# Patient Record
Sex: Female | Born: 1990
Health system: Southern US, Community
[De-identification: ages and names within clinical notes are randomized; demographics above are authoritative.]

## PROBLEM LIST (undated history)

## (undated) DIAGNOSIS — J309 Allergic rhinitis, unspecified: Secondary | ICD-10-CM

## (undated) DIAGNOSIS — F419 Anxiety disorder, unspecified: Secondary | ICD-10-CM

## (undated) DIAGNOSIS — E282 Polycystic ovarian syndrome: Secondary | ICD-10-CM

## (undated) DIAGNOSIS — G4730 Sleep apnea, unspecified: Secondary | ICD-10-CM

## (undated) DIAGNOSIS — F53 Postpartum depression: Secondary | ICD-10-CM

## (undated) DIAGNOSIS — J40 Bronchitis, not specified as acute or chronic: Secondary | ICD-10-CM

## (undated) DIAGNOSIS — J45909 Unspecified asthma, uncomplicated: Secondary | ICD-10-CM

## (undated) DIAGNOSIS — J189 Pneumonia, unspecified organism: Secondary | ICD-10-CM

## (undated) DIAGNOSIS — O480 Post-term pregnancy: Secondary | ICD-10-CM

## (undated) DIAGNOSIS — L049 Acute lymphadenitis, unspecified: Secondary | ICD-10-CM

## (undated) DIAGNOSIS — Z3A41 41 weeks gestation of pregnancy: Secondary | ICD-10-CM

## (undated) DIAGNOSIS — O99345 Other mental disorders complicating the puerperium: Secondary | ICD-10-CM

## (undated) HISTORY — DX: Allergic rhinitis, unspecified: J30.9

## (undated) HISTORY — DX: Polycystic ovarian syndrome: E28.2

## (undated) HISTORY — DX: Unspecified asthma, uncomplicated: J45.909

## (undated) HISTORY — DX: Acute lymphadenitis, unspecified: L04.9

## (undated) HISTORY — PX: WISDOM TOOTH EXTRACTION: SHX21

## (undated) HISTORY — PX: OTHER SURGICAL HISTORY: SHX169

---
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
## 2009-01-25 ENCOUNTER — Ambulatory Visit: Payer: Self-pay | Admitting: Internal Medicine

## 2010-05-06 ENCOUNTER — Ambulatory Visit: Payer: Self-pay | Admitting: Family Medicine

## 2010-07-19 NOTE — Assessment & Plan Note (Signed)
Summary: med refill for asthma med--Dr Drue Novel pt--returning from school t...   Vital Signs:  Patient profile:   20 year old female Weight:      141 pounds BMI:     23.55 BP sitting:   118 / 76  (right arm)  Vitals Entered By: Doristine Devoid CMA (May 06, 2010 11:02 AM) CC: refill on asthma meds    History of Present Illness: 20 yo woman here today for asthma meds.  rarely using albuterol inhaler unless around animals.  feels asthma has gotten much better since moving to college (less cats/dogs, no longer [place] the [place]).  using Advair daily.  denies wheezing, SOB.  Current Medications (verified): 1)  Advair Diskus 100-50 Mcg/dose Misc (Fluticasone-Salmeterol) .Marland Kitchen.. 1 Puff Two Times A Day 2)  Ventolin Hfa 108 (90 Base) Mcg/act Aers (Albuterol Sulfate) .... 2 Puffs Qid As Needed Wheezing; 2 Puffs 30 To 40 Min Before Exercise 3)  Flonase 50 Mcg/act Susp (Fluticasone Propionate) .... 2 Puffs Once Daily On Each Side of The Nose 4)  Ortho Tri-Cyclen Lo 0.18/0.215/0.25 Mg-25 Mcg Tabs (Norgestim-Eth Estrad Triphasic) .... Take One Tablet Daily  Allergies (verified): No Known Drug Allergies  Social History: in [employer] household : F M 1 sister, 3 bro cats x 2, dog x 2   Review of Systems      See HPI  Physical Exam  General:  alert and well-developed.   Lungs:  normal respiratory effort, no intercostal retractions, no accessory muscle use, and normal breath sounds.   Heart:  normal rate, regular rhythm, and no murmur.     Impression & Recommendations:  Problem # 1:  ASTHMA (ICD-493.90) Assessment Unchanged rarely having to use recue inhaler now that she doesn't have regular contact w/ animals.  discussed possibility of switching to single agent controller med and stopping Advair.  pt will consider this for the future but doesn't want to do it now w/ winter break upcoming when she will be home w/ the animals. Her updated medication list for this problem includes:    Advair  Diskus 100-50 Mcg/dose Misc (Fluticasone-salmeterol) .Marland Kitchen... 1 puff two times a day    Ventolin Hfa 108 (90 Base) Mcg/act Aers (Albuterol sulfate) .Marland Kitchen... 2 puffs qid as needed wheezing; 2 puffs 30 to 40 min before exercise  Complete Medication List: 1)  Advair Diskus 100-50 Mcg/dose Misc (Fluticasone-salmeterol) .Marland Kitchen.. 1 puff two times a day 2)  Ventolin Hfa 108 (90 Base) Mcg/act Aers (Albuterol sulfate) .... 2 puffs qid as needed wheezing; 2 puffs 30 to 40 min before exercise 3)  Flonase 50 Mcg/act Susp (Fluticasone propionate) .... 2 puffs once daily on each side of the nose 4)  Ortho Tri-cyclen Lo 0.18/0.215/0.25 Mg-25 Mcg Tabs (Norgestim-eth estrad triphasic) .... Take one tablet daily  Patient Instructions: 1)  Schedule your complete physical at your convenience 2)  Continue your Advair as directed- use the Ventolin as needed for cough or wheezing 3)  Consider as your asthma improves switching to single agent controller med 4)  Have a great holiday season!!! Prescriptions: ADVAIR DISKUS 100-50 MCG/DOSE MISC (FLUTICASONE-SALMETEROL) 1 puff two times a day  #1 x 3   Entered and Authorized by:   Neena Rhymes MD   Signed by:   Neena Rhymes MD on 05/06/2010   Method used:   Electronically to        [facility] [phone][facility] (retail)       [address]  [place], [place]  [address]       Ph: [phone]       Fax: [phone]   RxID:   [id number] VENTOLIN HFA 108 (90 BASE) MCG/ACT AERS (ALBUTEROL SULFATE) 2 puffs qid as needed wheezing; 2 puffs 30 to 40 min before exercise  #1 x 3   Entered and Authorized by:   Neena Rhymes MD   Signed by:   Neena Rhymes MD on 05/06/2010   Method used:   Electronically to        [facility] [phone]* (retail)       [address]       [place], [place]  [address]       Ph: [phone]       Fax: [phone]   RxID:   [id number]    Orders Added: 1)  Est. Patient  Level III [16010]

## 2011-07-06 ENCOUNTER — Other Ambulatory Visit: Payer: Self-pay | Admitting: Internal Medicine

## 2011-07-06 NOTE — Telephone Encounter (Signed)
Last seen in 2010: call 1 advair , no further  RF w/o OV . If sx severe/persistent --- needs OV or UC eval ASAP

## 2011-07-06 NOTE — Telephone Encounter (Signed)
Pt has not been seen within the last year.  Confirmed Advair on medication list.  Pls advise.

## 2011-07-07 MED ORDER — FLUTICASONE-SALMETEROL 100-50 MCG/DOSE IN AEPB: 1.0000 | INHALATION_SPRAY | Freq: Two times a day (BID) | RESPIRATORY_TRACT | Status: DC

## 2011-07-07 NOTE — Telephone Encounter (Signed)
Attempted to contact pt multiple times.  Left message for pt to return call.

## 2011-07-07 NOTE — Telephone Encounter (Signed)
Rx sent to pharmacy electronically.  Left a message for pt to return call.

## 2011-10-03 ENCOUNTER — Other Ambulatory Visit: Payer: Self-pay | Admitting: Internal Medicine

## 2011-10-03 NOTE — Telephone Encounter (Signed)
Pt has not been seen within a year. Ok to refill? 

## 2011-10-06 NOTE — Telephone Encounter (Signed)
Okay to call one month supply, no refills. She has not been seen in more than a year, no further RF  without office visit

## 2011-10-06 NOTE — Telephone Encounter (Signed)
Refill done.  

## 2012-02-06 ENCOUNTER — Other Ambulatory Visit: Payer: Self-pay | Admitting: Internal Medicine

## 2012-02-06 NOTE — Telephone Encounter (Signed)
Refill done.  

## 2012-02-06 NOTE — Telephone Encounter (Signed)
Per Dr. Drue Novel last phone not pt needs an OV for future refills.

## 2012-04-01 ENCOUNTER — Ambulatory Visit (INDEPENDENT_AMBULATORY_CARE_PROVIDER_SITE_OTHER): Payer: BC Managed Care – PPO | Admitting: Internal Medicine

## 2012-04-01 VITALS — BP 112/76 | HR 67 | Temp 97.8°F | Ht 66.0 in | Wt 144.0 lb

## 2012-04-01 DIAGNOSIS — J45909 Unspecified asthma, uncomplicated: Secondary | ICD-10-CM

## 2012-04-01 DIAGNOSIS — J309 Allergic rhinitis, unspecified: Secondary | ICD-10-CM

## 2012-04-01 DIAGNOSIS — Z0000 Encounter for general adult medical examination without abnormal findings: Secondary | ICD-10-CM | POA: Insufficient documentation

## 2012-04-01 DIAGNOSIS — Z23 Encounter for immunization: Secondary | ICD-10-CM

## 2012-04-01 MED ORDER — ALBUTEROL SULFATE HFA 108 (90 BASE) MCG/ACT IN AERS: 2.0000 | INHALATION_SPRAY | Freq: Four times a day (QID) | RESPIRATORY_TRACT | Status: DC | PRN

## 2012-04-01 NOTE — Progress Notes (Signed)
  Subjective:    Patient ID: Brandi Hall, female    DOB: July 03, 1990, 21 y.o.   MRN: [id number]  HPI CPX  Past Medical History: hosp for infected lymph node (Salmonella) at 21 y/o asthma Allergic rhinitis  Past Surgical History: LAD Bx  Family History: breast cancer-- mother colon ca-- GGfather uterine ca-- GM, aunt  DM-- GF CAD-- GF (CHF, pacemaker)  Social History: Therapist, art, has 2 jobs Living w/ parents  cats x 2, dog x1  Tobacco-- no ETOH-- socially   Review of Systems Doing well. Denies any chest pain, shortness of breath. No dysuria gross hematuria. No vaginal discharge Occasionally has anxiety but that is rare, no depression. x last 2 years has occasional right upper abdominal pain usually related to constipation. No associated fever chills or vomiting. Occasional nausea    Objective:   Physical Exam  General -- alert, well-developed, and well-nourished.   Neck --no thyromegaly Lungs -- normal respiratory effort, no intercostal retractions, no accessory muscle use, and normal breath sounds.   Heart-- normal rate, regular rhythm, no murmur, and no gallop.   Abdomen--soft, non-tender, no distention, no masses, no HSM, no guarding, and no rigidity.   Extremities-- no pretibial edema bilaterally Neurologic-- alert & oriented X3 and strength normal in all extremities. Psych-- Cognition and judgment appear intact. Alert and cooperative with normal attention span and concentration.  not anxious appearing and not depressed appearing.        Assessment & Plan:

## 2012-04-01 NOTE — Patient Instructions (Addendum)
Uses ventolin if you are going out for a run. Also if wheezing. Minimize your exposed to pets Please check your records, be sure you have 3 shots of hepatitis B Please come back in 6 months

## 2012-04-01 NOTE — Assessment & Plan Note (Signed)
On claritin

## 2012-04-01 NOTE — Assessment & Plan Note (Addendum)
Td 2003, Tdap today Had Gardasil completed Flu shot (history of asthma) Per chart review, not sure if she completed hep B shots, recommend pt to review her records Diet and exercise discussed Female care by Dr Lilli Light Also, occupational abdominal pain if constipated, recommend to drink plenty of fluids and fruits-vegetables. Metamucin is also an option to prevent constipation

## 2012-04-01 NOTE — Assessment & Plan Note (Signed)
Currently on advair twice a day, ran out of Ventolin. Wheezing  only if running more than usual or if she is exposed to animals. Recommend to continue with  advair Rx Ventolin  for prn and pre-exercise use Also prevent asthma w/ antigen avoidance, minimize exposure, tips provided  Reassess in 6 months, consider switch to Qvar.

## 2012-04-02 ENCOUNTER — Encounter: Payer: Self-pay | Admitting: Internal Medicine

## 2012-04-02 LAB — CBC WITH DIFFERENTIAL/PLATELET
Basophils Relative: 0.4 % (ref 0.0–3.0)
Eosinophils Relative: 13 % — ABNORMAL HIGH (ref 0.0–5.0)
HCT: 38.8 % (ref 36.0–46.0)
Hemoglobin: 12.8 g/dL (ref 12.0–15.0)
Lymphs Abs: 2.3 10*3/uL (ref 0.7–4.0)
MCHC: 32.8 g/dL (ref 30.0–36.0)
MCV: 91.7 fl (ref 78.0–100.0)
Monocytes Absolute: 0.3 10*3/uL (ref 0.1–1.0)
Neutro Abs: 3.3 10*3/uL (ref 1.4–7.7)
Neutrophils Relative %: 48.5 % (ref 43.0–77.0)
RBC: 4.24 Mil/uL (ref 3.87–5.11)
WBC: 6.8 10*3/uL (ref 4.5–10.5)

## 2012-04-02 LAB — COMPREHENSIVE METABOLIC PANEL
AST: 21 U/L (ref 0–37)
Alkaline Phosphatase: 42 U/L (ref 39–117)
BUN: 10 mg/dL (ref 6–23)
Creatinine, Ser: 0.7 mg/dL (ref 0.4–1.2)
Glucose, Bld: 75 mg/dL (ref 70–99)
Total Bilirubin: 0.5 mg/dL (ref 0.3–1.2)

## 2012-04-03 ENCOUNTER — Encounter: Payer: Self-pay | Admitting: *Deleted

## 2012-04-05 ENCOUNTER — Other Ambulatory Visit: Payer: Self-pay | Admitting: Internal Medicine

## 2012-04-05 NOTE — Telephone Encounter (Signed)
Refill done.  

## 2012-09-30 ENCOUNTER — Ambulatory Visit: Payer: Self-pay | Admitting: Internal Medicine

## 2012-10-10 ENCOUNTER — Encounter: Payer: Self-pay | Admitting: Internal Medicine

## 2012-10-10 ENCOUNTER — Ambulatory Visit (INDEPENDENT_AMBULATORY_CARE_PROVIDER_SITE_OTHER): Payer: BC Managed Care – PPO | Admitting: Internal Medicine

## 2012-10-10 VITALS — BP 122/80 | HR 81 | Temp 98.2°F | Wt 145.0 lb

## 2012-10-10 DIAGNOSIS — J45909 Unspecified asthma, uncomplicated: Secondary | ICD-10-CM

## 2012-10-10 MED ORDER — BECLOMETHASONE DIPROPIONATE 80 MCG/ACT IN AERS: 2.0000 | INHALATION_SPRAY | Freq: Two times a day (BID) | RESPIRATORY_TRACT | Status: DC

## 2012-10-10 NOTE — Patient Instructions (Addendum)
In 2 or 3 weeks, stop  advair and use instead qvar 2 puffs twice a day. As long as your asthma is well controlled stay on qvar, if you notice you are needing ventolin more often then go back to advair and let me know Next visit 6 months for a yearly check up

## 2012-10-10 NOTE — Assessment & Plan Note (Signed)
Well-controlled with Advair. Will try Qvar and continue with albuterol when necessary. See instructions.

## 2012-10-10 NOTE — Progress Notes (Signed)
  Subjective:    Patient ID: Brandi Hall, female    DOB: 12-21-1990, 22 y.o.   MRN: [id number]  HPI Routine office visit Asthma, good compliance with medications, uses albuterol preexercise but otherwise very rarely, less than one time a week. Has made some changes at home and is less exposed to cats. Allergies well controlled.  Also, he is concerned about some knots:  1 at the left leg, One in the right breast and one in the right chest. They were checked by her gynecologist, felt to be fatty tissue, she had an ultrasound and apparently they didn't find anything wrong. The one in the chest is tender sometimes.  Past Medical History: hosp for infected lymph node (Salmonella) at 22 y/o asthma Allergic rhinitis  Past Surgical History: LAD Bx  Family History: breast cancer-- mother colon ca-- GGfather uterine ca-- GM, aunt   DM-- GF CAD-- GF (CHF, pacemaker)  Social History: Therapist, art, has 2 jobs Living w/ parents   cats x 2, dog x1   Tobacco-- no ETOH-- socially   Review of Systems     Objective:   Physical Exam  Constitutional: She appears well-developed and well-nourished. No distress.  Cardiovascular: Normal rate, regular rhythm and normal heart sounds.  Exam reveals no gallop.   No murmur heard. Pulmonary/Chest: Effort normal and breath sounds normal. No respiratory distress. She has no wheezes. She has no rales.  Genitourinary:  Breast quite nodular but no dominant mass. No abnormality in the chest, the 2 areas of pt's concern are in the R breast  Skin: She is not diaphoretic.             Assessment & Plan:  Fibrocystic breast dz? Get U/S report SBE Call if SBE changes or pain increases, if that is the case will need a MMG  SQ nodule in the leg Observation, call if change in size

## 2013-04-09 ENCOUNTER — Telehealth: Payer: Self-pay

## 2013-04-09 NOTE — Telephone Encounter (Addendum)
Left message for call back  Unable to reach prior to visit

## 2013-04-11 ENCOUNTER — Encounter: Payer: BC Managed Care – PPO | Admitting: Internal Medicine

## 2013-04-16 ENCOUNTER — Telehealth: Payer: Self-pay

## 2013-04-16 NOTE — Telephone Encounter (Addendum)
Left message for call back Non identifiable Mother returned call---not on DPR Will have patient call  PAP--? Flu vaccine? All 3 Hep ? Unable to reach prior to visit

## 2013-04-17 ENCOUNTER — Encounter: Payer: Self-pay | Admitting: Internal Medicine

## 2013-04-17 ENCOUNTER — Ambulatory Visit (INDEPENDENT_AMBULATORY_CARE_PROVIDER_SITE_OTHER): Payer: BC Managed Care – PPO | Admitting: Internal Medicine

## 2013-04-17 VITALS — BP 112/68 | HR 78 | Temp 98.4°F | Ht 66.25 in | Wt 142.2 lb

## 2013-04-17 DIAGNOSIS — Z0000 Encounter for general adult medical examination without abnormal findings: Secondary | ICD-10-CM

## 2013-04-17 DIAGNOSIS — J45909 Unspecified asthma, uncomplicated: Secondary | ICD-10-CM

## 2013-04-17 DIAGNOSIS — Z1331 Encounter for screening for depression: Secondary | ICD-10-CM

## 2013-04-17 LAB — LIPID PANEL
HDL: 74 mg/dL (ref >39.00)
LDL Cholesterol: 84 mg/dL (ref 0–99)
Total CHOL/HDL Ratio: 2
VLDL: 14.6 mg/dL (ref 0.0–40.0)

## 2013-04-17 LAB — COMPREHENSIVE METABOLIC PANEL
AST: 18 U/L (ref 0–37)
BUN: 8 mg/dL (ref 6–23)
CO2: 26 mEq/L (ref 19–32)
Calcium: 9.2 mg/dL (ref 8.4–10.5)
Chloride: 105 mEq/L (ref 96–112)
Creatinine, Ser: 0.7 mg/dL (ref 0.4–1.2)
GFR: 113.89 mL/min (ref >60.00)
Total Bilirubin: 0.7 mg/dL (ref 0.3–1.2)

## 2013-04-17 LAB — CBC WITH DIFFERENTIAL/PLATELET
Basophils Relative: 0.6 % (ref 0.0–3.0)
HCT: 39.7 % (ref 36.0–46.0)
Hemoglobin: 13.5 g/dL (ref 12.0–15.0)
Lymphocytes Relative: 28.3 % (ref 12.0–46.0)
Monocytes Relative: 4.4 % (ref 3.0–12.0)
Neutro Abs: 3.8 10*3/uL (ref 1.4–7.7)
RBC: 4.49 Mil/uL (ref 3.87–5.11)

## 2013-04-17 MED ORDER — AMOXICILLIN 500 MG PO CAPS: 1000.0000 mg | ORAL_CAPSULE | Freq: Two times a day (BID) | ORAL | Status: DC

## 2013-04-17 NOTE — Progress Notes (Signed)
  Subjective:    Patient ID: Brandi Hall, female    DOB: 1990-08-15, 22 y.o.   MRN: [id number]  HPI CPX Developed a cold 2 weeks ago: Sore throat, nasal congestion, postnasal dripping, cough green sputum. Asthma is usually under excellent control with Advair, rarely uses albuterol but in the last 2 weeks she has needed albuterol daily. In the last 2 days wheezing has  decreased.  Past Medical History  Diagnosis Date  . Lymph node abscess     hosp for infected lymph node (Salmonella) at 22 y/o  . Asthma   . Allergic rhinitis    Past Surgical History  Procedure Laterality Date  . Lad bx     History   Social History  . Marital Status: Single    Spouse Name: N/A    Number of Children: 0  . Years of Education: N/A   Occupational History  . finished college, 2 jobs    Social History Main Topics  . Smoking status: Never Smoker   . Smokeless tobacco: Never Used  . Alcohol Use: Yes     Comment: rarely  . Drug Use: No  . Sexual Activity: Not on file   Other Topics Concern  . Not on file   Social History Narrative   Lives by herself   cats x  1     Family History  Problem Relation Age of Onset  . Breast cancer Mother     Breast  . Uterine cancer Maternal Aunt   . Colon cancer Other     Colon  . Diabetes Other     GGF  . Coronary artery disease    . Congestive Heart Failure        Review of Systems Diet-- healthier  No  CP,  lower extremity edema Denies  nausea, vomiting; + diarrhea and abd cramps 1 hour after eat bread or pasta x last month Denies  blood in the stools No GERD  Sx.  No dysuria, gross hematuria, difficulty urinating   + stress but no anxiety, depression        Objective:   Physical Exam BP 112/68  Pulse 78  Temp(Src) 98.4 F (36.9 C) (Oral)  Ht 5' 6.25" (1.683 m)  Wt 142 lb 3.2 oz (64.501 kg)  BMI 22.77 kg/m2  SpO2 99%  LMP 03/30/2013 General -- alert, well-developed, NAD.   HEENT-- Not pale. TMs normal, throat symmetric, no  redness or discharge. Face symmetric, sinuses not tender to palpation. Nose quite congested.  Lungs -- normal respiratory effort, no intercostal retractions, no accessory muscle use, and normal breath sounds (except for few ronchi, no wheezing)  Heart-- normal rate, regular rhythm, no murmur.  Abdomen-- Not distended, good bowel sounds,soft, non-tender.  Extremities-- no pretibial edema bilaterally  Neurologic--  alert & oriented X3. Speech normal, gait normal, strength normal in all extremities.  Psych-- Cognition and judgment appear intact. Cooperative with normal attention span and concentration. No anxious appearing , no depressed appearing.      Assessment & Plan:

## 2013-04-17 NOTE — Patient Instructions (Signed)
Get your blood work before you leave  Next visit in 3 months for a follow up . No Fasting Please make an appointment    Rest, fluids , tylenol For cough, take Mucinex DM twice a day as needed  Continue your asthma meds Take the antibiotic as prescribed  (Amoxicillin) Call if no better in few days Call anytime if the symptoms are severe Once  better I recommend a pneumonia and flu shot.     Gluten-Free Diet Gluten is a protein found in many grains. Gluten is present in wheat, rye, and barley. Gluten from wheat, rye, and barley protein interferes with the absorption of food in people with gluten sensitivity. It may also cause intestinal injury when eaten by individuals with gluten sensitivity.  A sample piece (biopsy) of the small intestine is usually required for a positive diagnosis of gluten sensitivity. Dietary treatment consists of eliminating foods and food ingredients from wheat, rye, and barley. When these are taken out of the diet completely, most people regain function of the small intestine. Strict compliance is important even during symptom-free periods. People with gluten sensitivity need to be on a gluten-free diet for a lifetime. During the first stages of treatment, some people will also need to restrict dairy products that contain lactose, which is a naturally occurring sugar. Lactose is difficult to absorb when the small intestines are damaged (lactose intolerance).  WHO NEEDS THIS DIET Some people who have certain diseases need to be on a gluten-free diet. These diseases include:  Celiac disease.  Nontropical sprue.  Gluten-sensitive enteropathy.  Dermatitis herpetiformis. SPECIAL NOTES  Read all labels because gluten may have been added as an incidental ingredient. Words to check for on the label include: flour, starch, durum flour, graham flour, phosphated flour, self-rising flour, semolina, farina, modified food starch, cereal, thickening, fillers, emulsifiers, any  kind of malt flavoring, and hydrolyzed vegetable protein. A registered dietician can help you identify possible harmful ingredients in the foods you normally eat.  If you are not sure whether an ingredient contains gluten, check with the manufacturer. Note that some manufacturers may change ingredients without notice. Always read labels.  Since flour and cereal products are often used in the preparation of foods, it is important to be aware of the methods of preparation used, as well as the foods themselves. This is especially true when you are dining out. Starches  Allowed: Only those prepared from arrowroot, corn, potato, rice, and bean flours. Rice wafers(*), pure cornmeal tortillas, popcorn, some crackers, and chips(*). Hot cereals made from cornmeal. Ask your dietician which specific hot and cold cereals are allowed. White or sweet potatoes, yams, hominy, rice or wild rice, and special gluten-free pasta. Some oriental rice noodles or bean noodles.  Avoid: All wheat and rye cereals, wheat germ, barley, bran, graham, malt, bulgur, and millet(-). NOTE: Avoid cereals containing malt as a flavoring, such as rice cereal. Regular noodles, spaghetti, macaroni, and most packaged rice mixes(*). All others containing wheat, rye, or barley. Vegetables  Allowed: All plain, fresh, frozen, or canned vegetables.  Avoid: Creamed vegetables(*) and vegetables canned in sauces(*). Any prepared with wheat, rye, or barley. Fruit  Allowed: All fresh, frozen, canned, or dried fruits. Fruit juices.  Avoid: Thickened or prepared fruits and some pie fillings(*). Meat and Meat Substitutes  Allowed: Meat, fish, poultry, or eggs prepared without added wheat, rye, or barley. Luncheon meat(*), frankfurters(*), and pure meat. All aged cheese and processed cheese products(*). Cottage cheese(+) and cream cheese(+). Dried beans,  dried peas, and lentils.  Avoid: Any meat or meat alternate containing wheat, rye, barley, or  gluten stabilizers. Bread-containing products, such as Swiss steak, croquettes, and meatloaf. Tuna canned in vegetable broth(*); [place] with HVP injected as part of the basting; any cheese product containing oat gum as an ingredient. Milk  Allowed: Milk. Yogurt made with allowed ingredients(*).  Avoid: Commercial chocolate milk which may have cereal added(*). Malted milk. Soups and Combination Foods  Allowed: Homemade broth and soups made with allowed ingredients; some canned or frozen soups are allowed(*). Combination or prepared foods that do not contain gluten(*). Read labels.  Avoid: All soups containing wheat, rye, or barley flour. Bouillon and bouillon cubes that contain hydrolyzed vegetable protein (HVP). Combination or prepared foods that contain gluten(*). Desserts  Allowed:  Custard, some pudding mixes(*), homemade puddings from cornstarch, rice, and tapioca. Gelatin desserts, ices, and sherbet(*). Cake, cookies, and other desserts prepared with allowed flours. Some commercial ice creams(*). Ask your dietician about specific brands of dessert that are allowed.  Avoid: Cakes, cookies, doughnuts, and pastries that are prepared with wheat, rye, or barley flour. Some commercial ice creams(*), ice cream flavors which contain cookies, crumbs, or cheesecake(*). Ice cream cones. All commercially prepared mixes for cakes, cookies, and other desserts(*). Bread pudding and other puddings thickened with flour. Sweets  Allowed: Sugar, honey, syrup(*), molasses, jelly, jam, plain hard candy, marshmallows, gumdrops, homemade candies free from wheat, rye, or barley. Coconut.  Avoid: Commercial candies containing wheat, rye, or barley(*). Certain buttercrunch toffees are dusted with wheat flour. Ask your dietician about specific brands that are not allowed. Chocolate-coated nuts, which are often rolled in flour. Fats and Oils  Allowed: Butter, margarine, vegetable oil, sour cream(+), whipping cream,  shortening, lard, cream, mayonnaise(*). Some commercial salad dressings(*). Peanut butter.  Avoid: Some commercial salad dressings(*). Beverages  Allowed: Coffee (regular or decaffeinated), tea, herbal tea (read label to be sure that no wheat flour has been added). Carbonated beverages and some root beers(*). Wine, sake, and distilled spirits, such as gin, vodka, and whiskey.  Avoid:  Certain cereal beverages. Ask your dietician about specific brands that are not allowed. Beer (unless gluten-free), ale, malted milk, and some root beers, wine, and sake. Condiments/ Miscellaneous  Allowed: Salt, pepper, herbs, spices, extracts, and food colorings. Monosodium glutamate (MSG). Cider, rice, and wine vinegar. Baking soda and baking powder. Certain soy sauces. Ask your dietician about specific brands that are allowed. Nuts, coconut, chocolate, and pure cocoa powder.  Avoid: Some curry powder(*), some dry seasoning mixes(*), some gravy extracts(*), some meat sauces(*), some catsup(*), some prepared mustard(*), horseradish(*), some soy sauce(*), chip dips(*), and some chewing gum(*). Yeast extract (contains barley). Caramel color (may contain malt). Ask your dietician about specific brands of condiments to avoid. Flour and Thickening Agents  Allowed: Arrowroot starch (A); Corn bran (B); Corn flour (B,C,D); Corn germ (B); Cornmeal (B,C,D); Corn starch (A); Potato flour (B,C,E); Potato starch flour (B,C,E); Rice bran (B); Rice flours: Plain, brown (B,C,D,E), and Sweet (A,B,C,F). Rice polish (B,C,G); Soy flour (B,C,G); Tapioca starch (A). The flour and thickening agents described above are good for: (A) Good thickening agent (B) Good when combined with other flours (C) Best combined with milk and eggs in baked products (D) Best in grainy-textured products (E) Produces drier product than other flours (F) Produces moister product than other flours (G) Adds distinct flavor to product. Use in moderation. (*)  Check labels and investigate any questionable ingredients.  (-) Additional research is needed before this  product can be recommended. (+) Check vegetable gum used. SAMPLE MEAL PLAN Breakfast   Orange juice.  Banana.  Rice or corn cereal.  Toast (gluten-free bread).  Heart-healthy tub margarine.  Jam.  Milk.  Coffee or tea. Lunch  Chicken salad sandwich (with gluten-free bread and mayonnaise).  Sliced tomatoes.  Heart-healthy tub margarine.  Apple.  Milk.  Coffee or tea. Dinner  [place].  Baked potato.  Broccoli.  Lettuce salad with gluten-free dressing.  Gluten-free bread.  Custard.  Heart-healthy margarine.  Coffee or tea. These meal plans are provided as samples. Your daily meal plans will vary. Document Released: 06/05/2005 Document Revised: 12/05/2011 Document Reviewed: 07/16/2011 [facility] Patient Information 2014 [place], [place].

## 2013-04-17 NOTE — Assessment & Plan Note (Addendum)
Td 20013 Reluctant to get a flu shot , got sick last year PNM shot once she is better from the URI ?completed hep B shots, ? menactra---> recommend pt to bring her records Diet and exercise discussed Female care needs a new gyn in [place], will refer  Labs Also has developed diarrhea for the last month, usually following a bread  or pasta intake, no recent antibiotics: Recommend the gluten-free diet as a trial, if anemia or other symptoms persist she will need further eval. Followup in 3 months

## 2013-04-17 NOTE — Assessment & Plan Note (Signed)
Qvar didn't work, went back to advair  twice a day, symptoms under excellent control , hardly ever uses ventolin except for the last 2 weeks----> currently has a URI/bronchitis. Plan: Amoxicillin, see instructions

## 2013-04-18 ENCOUNTER — Encounter: Payer: Self-pay | Admitting: Internal Medicine

## 2013-04-21 ENCOUNTER — Encounter: Payer: Self-pay | Admitting: *Deleted

## 2013-06-19 ENCOUNTER — Other Ambulatory Visit: Payer: Self-pay | Admitting: Internal Medicine

## 2013-06-20 NOTE — Telephone Encounter (Signed)
Advair refilled per protocol. JG//CMA 

## 2013-07-22 ENCOUNTER — Encounter: Payer: Self-pay | Admitting: Internal Medicine

## 2013-07-22 ENCOUNTER — Ambulatory Visit (INDEPENDENT_AMBULATORY_CARE_PROVIDER_SITE_OTHER): Payer: BC Managed Care – PPO | Admitting: Internal Medicine

## 2013-07-22 VITALS — BP 107/68 | HR 70 | Temp 97.9°F | Wt 142.0 lb

## 2013-07-22 DIAGNOSIS — Z0000 Encounter for general adult medical examination without abnormal findings: Secondary | ICD-10-CM

## 2013-07-22 DIAGNOSIS — Z23 Encounter for immunization: Secondary | ICD-10-CM

## 2013-07-22 DIAGNOSIS — J45909 Unspecified asthma, uncomplicated: Secondary | ICD-10-CM

## 2013-07-22 DIAGNOSIS — J309 Allergic rhinitis, unspecified: Secondary | ICD-10-CM

## 2013-07-22 MED ORDER — BECLOMETHASONE DIPROPIONATE 80 MCG/ACT IN AERS: 1.0000 | INHALATION_SPRAY | Freq: Two times a day (BID) | RESPIRATORY_TRACT | Status: DC

## 2013-07-22 MED ORDER — AZELASTINE HCL 0.1 % NA SOLN: 2.0000 | Freq: Two times a day (BID) | NASAL | Status: DC

## 2013-07-22 NOTE — Assessment & Plan Note (Addendum)
On Flonase, still has symptoms, add astepro. Also discussed avoidance of allergens, use of saline nasal irrigation

## 2013-07-22 NOTE — Patient Instructions (Addendum)
In addition to Flonase, take Astelin twice a day, see if that help with your allergies  In a couple of months stop Advair and try Qvar twice a day, stay  on Qvar as far as your asthma continue to be well controlled otherwise go back to Advair.   Next visit 6 months

## 2013-07-22 NOTE — Progress Notes (Signed)
Pre visit review using our clinic review tool, if applicable. No additional management support is needed unless otherwise documented below in the visit note. 

## 2013-07-22 NOTE — Assessment & Plan Note (Signed)
Symptoms well-controlled on Advair and Ventolin. Declined a flu shot Pneumonia shot provided today Plan: In a couple of months switch from advair to Qvar, go back to Advair if symptoms are not well controlled.

## 2013-07-22 NOTE — Progress Notes (Signed)
   Subjective:    Patient ID: Brandi Hall, female    DOB: 04/12/1991, 23 y.o.   MRN: [id number]  HPI Routine office visit Asthma, good compliance of medication, very rarely uses Ventolin. Allergies--on Flonase, continue with persistent sinus congestion.  Past Medical History  Diagnosis Date  . Lymph node abscess     hosp for infected lymph node (Salmonella) at 23 y/o  . Asthma   . Allergic rhinitis    Past Surgical History  Procedure Laterality Date  . Lad bx      Review of Systems Denies fever or chills + Nasal discharge and postnasal dripping,  Color varies  from clear to white. Has changed her diet, feels better with a gluten decreased diet. Able to exercise without cough or wheezing.     Objective:   Physical Exam BP 107/68  Pulse 70  Temp(Src) 97.9 F (36.6 C)  Wt 142 lb (64.411 kg)  SpO2 96% General -- alert, well-developed, NAD.   HEENT-- Not pale. TMs normal, throat symmetric, no redness or discharge. Face symmetric, sinuses not tender to palpation. Nose slt congested. Lungs -- normal respiratory effort, no intercostal retractions, no accessory muscle use, and normal breath sounds.  Heart-- normal rate, regular rhythm, no murmur.  Neurologic--  alert & oriented X3. Speech normal, gait normal, strength normal in all extremities.  Psych-- Cognition and judgment appear intact. Cooperative with normal attention span and concentration. No anxious or depressed appearing.     Assessment & Plan:

## 2013-07-22 NOTE — Assessment & Plan Note (Signed)
See previous entry, gluten reduced diet is helping in general, feels better. Pneumonia shot today hep B shots , [facility] records pending

## 2013-10-14 ENCOUNTER — Other Ambulatory Visit: Payer: Self-pay | Admitting: *Deleted

## 2013-10-14 MED ORDER — FLUTICASONE-SALMETEROL 100-50 MCG/DOSE IN AEPB: INHALATION_SPRAY | RESPIRATORY_TRACT | Status: DC

## 2013-11-24 ENCOUNTER — Ambulatory Visit: Payer: BC Managed Care – PPO | Admitting: Family

## 2013-11-24 ENCOUNTER — Ambulatory Visit (INDEPENDENT_AMBULATORY_CARE_PROVIDER_SITE_OTHER): Payer: BC Managed Care – PPO | Admitting: Nurse Practitioner

## 2013-11-24 ENCOUNTER — Encounter: Payer: Self-pay | Admitting: Nurse Practitioner

## 2013-11-24 VITALS — BP 100/70 | HR 75 | Temp 98.0°F | Ht 66.25 in | Wt 139.0 lb

## 2013-11-24 DIAGNOSIS — H6593 Unspecified nonsuppurative otitis media, bilateral: Secondary | ICD-10-CM

## 2013-11-24 DIAGNOSIS — J069 Acute upper respiratory infection, unspecified: Secondary | ICD-10-CM

## 2013-11-24 DIAGNOSIS — H6590 Unspecified nonsuppurative otitis media, unspecified ear: Secondary | ICD-10-CM

## 2013-11-24 NOTE — Patient Instructions (Signed)
You have a virus causing your symptoms. The average duration of cold symptoms is 14 days. Start daily sinus rinses (neilmed Sinus Rinse). Use 30 mg to 60 mg pseudoephedrine twice daily. Sip fluids every hour. Rest. Continue to use claritin, or you may switch to azelastine. Do not use both. Continue flonase (use after sinus rinse). If you are not feeling better in 1 week or develop fever or chest pain, call us for re-evaluation. Feel better!  Upper Respiratory Infection, Adult An upper respiratory infection (URI) is also sometimes known as the common cold. The upper respiratory tract includes the nose, sinuses, throat, trachea, and bronchi. Bronchi are the airways leading to the lungs. Most people improve within 1 week, but symptoms can last up to 2 weeks. A residual cough may last even longer.  CAUSES Many different viruses can infect the tissues lining the upper respiratory tract. The tissues become irritated and inflamed and often become very moist. Mucus production is also common. A cold is contagious. You can easily spread the virus to others by oral contact. This includes kissing, sharing a glass, coughing, or sneezing. Touching your mouth or nose and then touching a surface, which is then touched by another person, can also spread the virus. SYMPTOMS  Symptoms typically develop 1 to 3 days after you come in contact with a cold virus. Symptoms vary from person to person. They may include:  Runny nose.  Sneezing.  Nasal congestion.  Sinus irritation.  Sore throat.  Loss of voice (laryngitis).  Cough.  Fatigue.  Muscle aches.  Loss of appetite.  Headache.  Low-grade fever. DIAGNOSIS  You might diagnose your own cold based on familiar symptoms, since most people get a cold 2 to 3 times a year. Your caregiver can confirm this based on your exam. Most importantly, your caregiver can check that your symptoms are not due to another disease such as strep throat, sinusitis, pneumonia,  asthma, or epiglottitis. Blood tests, throat tests, and X-rays are not necessary to diagnose a common cold, but they may sometimes be helpful in excluding other more serious diseases. Your caregiver will decide if any further tests are required. RISKS AND COMPLICATIONS  You may be at risk for a more severe case of the common cold if you smoke cigarettes, have chronic heart disease (such as heart failure) or lung disease (such as asthma), or if you have a weakened immune system. The very young and very old are also at risk for more serious infections. Bacterial sinusitis, middle ear infections, and bacterial pneumonia can complicate the common cold. The common cold can worsen asthma and chronic obstructive pulmonary disease (COPD). Sometimes, these complications can require emergency medical care and may be life-threatening. PREVENTION  The best way to protect against getting a cold is to practice good hygiene. Avoid oral or hand contact with people with cold symptoms. Wash your hands often if contact occurs. There is no clear evidence that vitamin C, vitamin E, echinacea, or exercise reduces the chance of developing a cold. However, it is always recommended to get plenty of rest and practice good nutrition. TREATMENT  Treatment is directed at relieving symptoms. There is no cure. Antibiotics are not effective, because the infection is caused by a virus, not by bacteria. Treatment may include:  Increased fluid intake. Sports drinks offer valuable electrolytes, sugars, and fluids.  Breathing heated mist or steam (vaporizer or shower).  Eating chicken soup or other clear broths, and maintaining good nutrition.  Getting plenty of rest.  Using gargles or lozenges for comfort.  Controlling fevers with ibuprofen or acetaminophen as directed by your caregiver.  Increasing usage of your inhaler if you have asthma. Zinc gel and zinc lozenges, taken in the first 24 hours of the common cold, can shorten the  duration and lessen the severity of symptoms. Pain medicines may help with fever, muscle aches, and throat pain. A variety of non-prescription medicines are available to treat congestion and runny nose. Your caregiver can make recommendations and may suggest nasal or lung inhalers for other symptoms.  HOME CARE INSTRUCTIONS   Only take over-the-counter or prescription medicines for pain, discomfort, or fever as directed by your caregiver.  Use a warm mist humidifier or inhale steam from a shower to increase air moisture. This may keep secretions moist and make it easier to breathe.  Drink enough water and fluids to keep your urine clear or pale yellow.  Rest as needed.  Return to work when your temperature has returned to normal or as your caregiver advises. You may need to stay home longer to avoid infecting others. You can also use a face mask and careful hand washing to prevent spread of the virus. SEEK MEDICAL CARE IF:   After the first few days, you feel you are getting worse rather than better.  You need your caregiver's advice about medicines to control symptoms.  You develop chills, worsening shortness of breath, or brown or red sputum. These may be signs of pneumonia.  You develop yellow or brown nasal discharge or pain in the face, especially when you bend forward. These may be signs of sinusitis.  You develop a fever, swollen neck glands, pain with swallowing, or white areas in the back of your throat. These may be signs of strep throat. SEEK IMMEDIATE MEDICAL CARE IF:   You have a fever.  You develop severe or persistent headache, ear pain, sinus pain, or chest pain.  You develop wheezing, a prolonged cough, cough up blood, or have a change in your usual mucus (if you have chronic lung disease).  You develop sore muscles or a stiff neck. Document Released: 11/29/2000 Document Revised: 08/28/2011 Document Reviewed: 10/07/2010 [facility] Patient Information 2014 [place],  [place].

## 2013-11-24 NOTE — Progress Notes (Signed)
Pre visit review using our clinic review tool, if applicable. No additional management support is needed unless otherwise documented below in the visit note. 

## 2013-11-24 NOTE — Progress Notes (Signed)
   Subjective:    Patient ID: Brandi Hall, female    DOB: 08-27-1990, 23 y.o.   MRN: [id number]  Cough This is a new problem. The current episode started in the past 7 days. The problem has been gradually worsening. The cough is productive of sputum. Associated symptoms include a fever (resolved), nasal congestion, postnasal drip and a sore throat. Pertinent negatives include no chest pain, chills, ear congestion, ear pain, headaches, shortness of breath or wheezing. The symptoms are aggravated by lying down. She has tried a beta-agonist inhaler for the symptoms. The treatment provided no relief. Her past medical history is significant for asthma.      Review of Systems  Constitutional: Positive for fever (resolved). Negative for chills, activity change, appetite change and fatigue.  HENT: Positive for congestion, postnasal drip and sore throat. Negative for ear pain.   Respiratory: Positive for cough and chest tightness. Negative for shortness of breath and wheezing.   Cardiovascular: Negative for chest pain.  Musculoskeletal: Negative for back pain.  Neurological: Negative for headaches.       Objective:   Physical Exam  Vitals reviewed. Constitutional: She is oriented to person, place, and time. She appears well-developed and well-nourished. No distress.  HENT:  Head: Normocephalic and atraumatic.  Right Ear: External ear normal.  Left Ear: External ear normal.  Mouth/Throat: Oropharynx is clear and moist. No oropharyngeal exudate.  bilat TM effusion, bones visible. Moderate clear nasal discharge.  Eyes: Conjunctivae are normal. Right eye exhibits no discharge. Left eye exhibits no discharge.  Neck: Normal range of motion. Neck supple. No thyromegaly present.  Cardiovascular: Normal rate, regular rhythm and normal heart sounds.   No murmur heard. Pulmonary/Chest: Effort normal and breath sounds normal. No respiratory distress. She has no wheezes. She has no rales.    Lymphadenopathy:    She has no cervical adenopathy.  Neurological: She is oriented to person, place, and time.  Skin: Skin is warm and dry.  Psychiatric: She has a normal mood and affect. Thought content normal.          Assessment & Plan:   1. Bilateral otitis media with effusion Continue flonase  2. Viral upper respiratory illness Sinus rinse pseudoephedrine

## 2013-12-10 ENCOUNTER — Ambulatory Visit (INDEPENDENT_AMBULATORY_CARE_PROVIDER_SITE_OTHER): Payer: BC Managed Care – PPO | Admitting: Family Medicine

## 2013-12-10 VITALS — BP 112/66 | HR 89 | Temp 98.2°F | Ht 65.0 in | Wt 137.4 lb

## 2013-12-10 DIAGNOSIS — M76899 Other specified enthesopathies of unspecified lower limb, excluding foot: Secondary | ICD-10-CM

## 2013-12-10 DIAGNOSIS — M7051 Other bursitis of knee, right knee: Secondary | ICD-10-CM

## 2013-12-10 MED ORDER — NAPROXEN 500 MG PO TABS: ORAL_TABLET | ORAL | Status: DC

## 2013-12-10 NOTE — Progress Notes (Signed)
   Subjective:    Patient ID: Brandi Hall, female    DOB: 07/05/1990, 23 y.o.   MRN: [id number]  HPI Patient presents with lump on left anterior tibia. Woke up with this morning. Sore and more uncomfortable and tender with knee flexion. Red in appearance. No known trauma. 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 No fever/chills.   Review of Systems  All other systems reviewed and are negative.      Objective:   Physical Exam  Constitutional: She is oriented to person, place, and time. She appears well-developed and well-nourished.  HENT:  Head: Normocephalic and atraumatic.  Eyes: Conjunctivae are normal. No scleral icterus.  Cardiovascular: Normal rate.   Pulmonary/Chest: Effort normal.  Neurological: She is alert and oriented to person, place, and time.  Skin: Skin is warm and dry.  Psychiatric: She has a normal mood and affect. Her behavior is normal.  Right knee: There is an erythematous swelling approximately 3 cm below the inferior aspect of the patella. This is warm to the touch. Well circumscribed margins. Minimally tender. Fluctuant. FROM of knee without pain. No joint line tenderness. No knee joint effusion. Ligaments stable.    Assessment & Plan:  #1. Infrapatellar bursitis. Favor traumatic from kneeling over infectious - Naproxen bid x 1 week - ice tid - compression with ace wrap - avoid kneeling - monitor erythema and return if spreading redness, fever, worsening pain, etc

## 2013-12-10 NOTE — Patient Instructions (Signed)
Thank you for coming in today  You have infrapatellar bursitis, or clergyman's knee This comes from the pressure of kneeling  1. No kneeling until resolved 2. Ace wrap for compression 3. Monitor redness. Return if spreading or if you get worsening pain or fever or other symptoms of being sick 4. Naproxen 2x per day with food for 1 week and then as needed 5. Ice for 15 minutes 3x per day until improved  Bursitis Bursitis is a swelling and soreness (inflammation) of a fluid-filled sac (bursa) that overlies and protects a joint. It can be caused by injury, overuse of the joint, arthritis or infection. The joints most likely to be affected are the elbows, shoulders, hips and knees. HOME CARE INSTRUCTIONS   Apply ice to the affected area for 15-20 minutes each hour while awake for 2 days. Put the ice in a plastic bag and place a towel between the bag of ice and your skin.  Rest the injured joint as much as possible, but continue to put the joint through a full range of motion, 4 times per day. (The shoulder joint especially becomes rapidly "frozen" if not used.) When the pain lessens, begin normal slow movements and usual activities.  Only take over-the-counter or prescription medicines for pain, discomfort or fever as directed by your caregiver.  Your caregiver may recommend draining the bursa and injecting medicine into the bursa. This may help the healing process.  Follow all instructions for follow-up with your caregiver. This includes any orthopedic referrals, physical therapy and rehabilitation. Any delay in obtaining necessary care could result in a delay or failure of the bursitis to heal and chronic pain. SEEK IMMEDIATE MEDICAL CARE IF:   Your pain increases even during treatment.  You develop an oral temperature above 102 F (38.9 C) and have heat and inflammation over the involved bursa. MAKE SURE YOU:   Understand these instructions.  Will watch your condition.  Will get  help right away if you are not doing well or get worse. Document Released: 06/02/2000 Document Revised: 08/28/2011 Document Reviewed: 05/07/2009 [facility] Patient Information 2015 [place], [place]. This information is not intended to replace advice given to you by your health care provider. Make sure you discuss any questions you have with your health care provider.

## 2013-12-14 ENCOUNTER — Other Ambulatory Visit: Payer: Self-pay | Admitting: Internal Medicine

## 2014-01-20 ENCOUNTER — Ambulatory Visit: Payer: BC Managed Care – PPO | Admitting: Nurse Practitioner

## 2014-01-20 ENCOUNTER — Ambulatory Visit: Payer: BC Managed Care – PPO | Admitting: Internal Medicine

## 2014-02-21 ENCOUNTER — Other Ambulatory Visit: Payer: Self-pay | Admitting: Internal Medicine

## 2014-03-11 ENCOUNTER — Ambulatory Visit: Payer: BC Managed Care – PPO | Admitting: Medical

## 2014-03-11 ENCOUNTER — Ambulatory Visit (INDEPENDENT_AMBULATORY_CARE_PROVIDER_SITE_OTHER): Payer: BC Managed Care – PPO | Admitting: Medical

## 2014-03-11 ENCOUNTER — Encounter: Payer: Self-pay | Admitting: Medical

## 2014-03-11 VITALS — BP 111/77 | HR 82 | Temp 98.3°F | Ht 65.75 in | Wt 142.6 lb

## 2014-03-11 DIAGNOSIS — N390 Urinary tract infection, site not specified: Secondary | ICD-10-CM

## 2014-03-11 DIAGNOSIS — R1013 Epigastric pain: Secondary | ICD-10-CM | POA: Insufficient documentation

## 2014-03-11 DIAGNOSIS — R300 Dysuria: Secondary | ICD-10-CM

## 2014-03-11 LAB — COMPREHENSIVE METABOLIC PANEL
ALBUMIN: 4.6 g/dL (ref 3.5–5.2)
ALT: 21 U/L (ref 0–35)
AST: 22 U/L (ref 0–37)
Alkaline Phosphatase: 48 U/L (ref 39–117)
BUN: 8 mg/dL (ref 6–23)
CHLORIDE: 103 meq/L (ref 96–112)
CO2: 31 meq/L (ref 19–32)
CREATININE: 0.8 mg/dL (ref 0.4–1.2)
Calcium: 9.5 mg/dL (ref 8.4–10.5)
GFR: 93.68 mL/min (ref >60.00)
Glucose, Bld: 88 mg/dL (ref 70–99)
Potassium: 4.5 mEq/L (ref 3.5–5.1)
Sodium: 137 mEq/L (ref 135–145)
Total Bilirubin: 0.7 mg/dL (ref 0.2–1.2)
Total Protein: 7.6 g/dL (ref 6.0–8.3)

## 2014-03-11 LAB — CBC WITH DIFFERENTIAL/PLATELET
BASOS PCT: 0.5 % (ref 0.0–3.0)
Basophils Absolute: 0 10*3/uL (ref 0.0–0.1)
Eosinophils Absolute: 0.7 10*3/uL (ref 0.0–0.7)
Eosinophils Relative: 9.1 % — ABNORMAL HIGH (ref 0.0–5.0)
HCT: 40.7 % (ref 36.0–46.0)
HEMOGLOBIN: 13.6 g/dL (ref 12.0–15.0)
LYMPHS PCT: 20.5 % (ref 12.0–46.0)
Lymphs Abs: 1.7 10*3/uL (ref 0.7–4.0)
MCHC: 33.4 g/dL (ref 30.0–36.0)
MCV: 90.5 fl (ref 78.0–100.0)
MONOS PCT: 3.7 % (ref 3.0–12.0)
Monocytes Absolute: 0.3 10*3/uL (ref 0.1–1.0)
Neutro Abs: 5.4 10*3/uL (ref 1.4–7.7)
Neutrophils Relative %: 66.2 % (ref 43.0–77.0)
Platelets: 202 10*3/uL (ref 150.0–400.0)
RBC: 4.5 Mil/uL (ref 3.87–5.11)
RDW: 12.6 % (ref 11.5–15.5)
WBC: 8.1 10*3/uL (ref 4.0–10.5)

## 2014-03-11 LAB — H. PYLORI ANTIBODY, IGG: H Pylori IgG: NEGATIVE

## 2014-03-11 LAB — POCT URINALYSIS DIPSTICK
BILIRUBIN UA: NEGATIVE
GLUCOSE UA: NEGATIVE
Ketones, UA: NEGATIVE
NITRITE UA: NEGATIVE
Protein, UA: NEGATIVE
Spec Grav, UA: <=1.005
Urobilinogen, UA: 0.2
pH, UA: 6

## 2014-03-11 LAB — LIPASE: Lipase: 36 U/L (ref 11.0–59.0)

## 2014-03-11 MED ORDER — RANITIDINE HCL 150 MG PO TABS: 150.0000 mg | ORAL_TABLET | Freq: Two times a day (BID) | ORAL | Status: DC

## 2014-03-11 MED ORDER — PHENAZOPYRIDINE HCL 200 MG PO TABS: 200.0000 mg | ORAL_TABLET | Freq: Three times a day (TID) | ORAL | Status: DC | PRN

## 2014-03-11 MED ORDER — NITROFURANTOIN MONOHYD MACRO 100 MG PO CAPS: 100.0000 mg | ORAL_CAPSULE | Freq: Two times a day (BID) | ORAL | Status: DC

## 2014-03-11 NOTE — Progress Notes (Signed)
   Subjective:    Patient ID: Brandi Hall, female    DOB: 12/09/1990, 23 y.o.   MRN: [id number]  HPI   3 days of burning on urination. Lmp started 3 days ago. Frequent urination. No nausea, no vomiting, no fever, and no chills. No back pain. Pt does have history of uti. Pt takes cranberry juice and some pills. Last uti 3 years ago. But did have some very frequent in the past.  Pt also mentions she had some stomach pain epigastric region when she woke up. She has gluten sensitivity hx. Epigastric region pain is a lot better. No recent gluten in diet that she knows of. No diarrhea this am. No vomiting. She does have occasional hx of heart burn. No history of any weight loss. No back pain. Last time drank any wine 2 days ago and was 1 glass of wine.   Review of Systems  Constitutional: Negative for fever, chills and fatigue.  Respiratory: Negative for cough, choking, chest tightness, shortness of breath and wheezing.   Cardiovascular: Negative for chest pain and palpitations.  Gastrointestinal: Positive for nausea and abdominal pain. Negative for vomiting, diarrhea, constipation, blood in stool, abdominal distention, anal bleeding and rectal pain.       Epigastric.Better now than it was this am.  Genitourinary: Positive for dysuria and frequency. Negative for urgency, hematuria, flank pain, enuresis, vaginal pain and pelvic pain.  Musculoskeletal: Negative for back pain.  Neurological: Negative.   Hematological: Negative for adenopathy. Does not bruise/bleed easily.  Psychiatric/Behavioral: Negative.        Objective:   Physical Exam  General  Mental Status- Alert. Orientation- Orientation x 4. Pleasant and no acute distress.  Skin General:- Normal. Moisture- Dry. Temperature- Warm.  HEENT Head- normal.  Neck Neck- Supple.  Heart Ausculation-RRR  Lungs Ausculation- Clear, even, unlabored bilaterlly.    Abdomen Palpation/Percussion: Palpation and Percussion of the  abdomen reveal- moderate epigastric Tender, No Rebound tenderness, No Rigidity(guarding), No Palpable abdominal masses and No jar tenderness. Mild  suprapubic tenderness. Liver:-Normal. Spleen:- Normal. Other Characteristics- No Costovertebral angle tenderness- Left or Costovertebral angle tenderness- Right.  Auscultation: Auscultation of the abdomen reveals- Bowel Sounds normal.  Back- no cva tenderness.        Assessment & Plan:

## 2014-03-11 NOTE — Assessment & Plan Note (Signed)
Based on patient's symptoms, history of UTI, and mildly suspicious urinalysis, I will go ahead and put her on Macrobid pending urine culture.

## 2014-03-11 NOTE — Patient Instructions (Signed)
You have uti symptoms and moderate suspicious ua. We will send your urine culture out and will go ahead and prescribe  macrobid antibiotic. I will send pyridium to your pharmacy as well for dysuria.  For your epigastric pain. I want you to take zantac and eat healthy diet. I also want to do some labs today due to your level of pain. If your abdomen pain worsens or changes please notify us.  Follow up in 2 weeks or as needed.

## 2014-03-11 NOTE — Assessment & Plan Note (Signed)
This could be associated with UTI or possible GERD. She does report some occasional reflux in the past. We'll go ahead and prescribe Zantac and advised healthy diet. Based on the physical exam to moderate epigastric tenderness on palpation I did decide to do labs today. If patient signs and symptoms worsen regarding abdominal pain then advised to return to clinic, urgent care or ED.

## 2014-03-12 LAB — CULTURE, URINE COMPREHENSIVE
Colony Count: NO GROWTH
ORGANISM ID, BACTERIA: NO GROWTH

## 2014-03-25 ENCOUNTER — Ambulatory Visit: Payer: BC Managed Care – PPO | Admitting: Internal Medicine

## 2014-03-25 ENCOUNTER — Encounter: Payer: Self-pay | Admitting: Medical

## 2014-03-25 ENCOUNTER — Ambulatory Visit (INDEPENDENT_AMBULATORY_CARE_PROVIDER_SITE_OTHER): Payer: BC Managed Care – PPO | Admitting: Medical

## 2014-03-25 VITALS — BP 111/76 | HR 78 | Temp 98.2°F | Ht 66.2 in | Wt 146.0 lb

## 2014-03-25 DIAGNOSIS — R1013 Epigastric pain: Secondary | ICD-10-CM

## 2014-03-25 DIAGNOSIS — R319 Hematuria, unspecified: Secondary | ICD-10-CM

## 2014-03-25 DIAGNOSIS — N390 Urinary tract infection, site not specified: Secondary | ICD-10-CM

## 2014-03-25 NOTE — Assessment & Plan Note (Signed)
These symptoms resolved and her culture was negative. Pt declined urine ancillary studies today. No symptoms presently. If symptoms recurrent then advise come in and get repeat studies and would touch base/offer ancillary studies as well. If symptom reoccur and no infectious cause found in future then would consider urologist referral.(IC)

## 2014-03-25 NOTE — Progress Notes (Signed)
Subjective:    Patient ID: Brandi Hall, female    DOB: 08/20/1990, 23 y.o.   MRN: [id number]  HPI  Pt had 3 days of dysuria and frequent urination. See last note. I sent out culture and placed her on antibiotic. She also took pyridium. By 2 days her symptoms began to get better and now complete resolution. Culture showed no growth. Pt has been married recently. Only with her husband over last 4 years. She does not think ancillary testing needed when I discussed this with her.  Pt also has some mild stomach pain random on and off in the pat. See last note. Pt state her stomach feels better when avoiding gluten(She states back to baseline. She is feeling better now.  She does take zantac. Her cbc, cmp and h pylori and lipase were normal. LMP- wks ago. Normal cycle.  Past Medical History  Diagnosis Date  . Lymph node abscess     hosp for infected lymph node (Salmonella) at 23 y/o  . Asthma   . Allergic rhinitis     History   Social History  . Marital Status: Single    Spouse Name: N/A    Number of Children: 0  . Years of Education: N/A   Occupational History  . finished college, 2 jobs    Social History Main Topics  . Smoking status: Never Smoker   . Smokeless tobacco: Never Used  . Alcohol Use: Yes     Comment: rarely  . Drug Use: No  . Sexual Activity: Not on file   Other Topics Concern  . Not on file   Social History Narrative   Lives by herself   cats x  1      Past Surgical History  Procedure Laterality Date  . Lad bx      Family History  Problem Relation Age of Onset  . Breast cancer Mother     Breast  . Uterine cancer Maternal Aunt   . Colon cancer Other     Colon  . Diabetes Other     GGF  . Coronary artery disease    . Congestive Heart Failure      No Known Allergies  Current Outpatient Prescriptions on File Prior to Visit  Medication Sig Dispense Refill  . ADVAIR DISKUS 100-50 MCG/DOSE AEPB INHALE 1 PUFF INTO THE LUNGS BY MOUTH 2  TIMES DAILY.  60 each  1  . albuterol (VENTOLIN HFA) 108 (90 BASE) MCG/ACT inhaler Inhale 2 puffs into the lungs every 6 (six) hours as needed for wheezing.  1 Inhaler  1  . fluticasone (FLONASE) 50 MCG/ACT nasal spray Place 2 sprays into the nose daily.      Marland Kitchen. loratadine (CLARITIN) 10 MG tablet Take 10 mg by mouth daily.      Lorita Officer. Norgestim-Eth Estrad Triphasic (ORTHO TRI-CYCLEN, 28, PO) Take by mouth.      . ranitidine (ZANTAC) 150 MG tablet Take 1 tablet (150 mg total) by mouth 2 (two) times daily.  60 tablet  0  . naproxen (NAPROSYN) 500 MG tablet Take 1 tablet by mouth 2x per day with food for 1 week then as needed.  30 tablet  0  . nitrofurantoin, macrocrystal-monohydrate, (MACROBID) 100 MG capsule Take 1 capsule (100 mg total) by mouth 2 (two) times daily.  14 capsule  0  . phenazopyridine (PYRIDIUM) 200 MG tablet Take 1 tablet (200 mg total) by mouth 3 (three) times daily as needed for pain.  6  tablet  0   No current facility-administered medications on file prior to visit.    BP 111/76  Pulse 78  Temp(Src) 98.2 F (36.8 C) (Oral)  Ht 5' 6.2" (1.681 m)  Wt 146 lb (66.225 kg)  BMI 23.44 kg/m2  SpO2 97%  LMP 03/11/2014     Review of Systems  Constitutional: Negative for chills, diaphoresis and fatigue.  HENT: Negative.   Respiratory: Negative for cough, choking, chest tightness, shortness of breath and wheezing.   Cardiovascular: Negative for chest pain, palpitations and leg swelling.  Gastrointestinal: Positive for abdominal pain. Negative for nausea, vomiting, diarrhea, constipation, blood in stool, abdominal distention, anal bleeding and rectal pain.       Occasional upset stomach after eating meals or if eats foods with gluten. Stomach feels good today.  Genitourinary: Negative.   Musculoskeletal: Negative for back pain.  Neurological: Negative.   Hematological: Negative for adenopathy. Does not bruise/bleed easily.         Objective:   Physical Exam  General  Appearance- Not in acute distress.  HEENT Eyes- Scleraeral/Conjuntiva-bilat- Not Yellow. Mouth & Throat- Normal.  Chest and Lung Exam Auscultation: Breath sounds:-Normal. Adventitious sounds:- No Adventitious sounds.  Cardiovascular Auscultation:Rythm - Regular. Heart Sounds -Normal heart sounds.  Abdomen Inspection:-Inspection Normal.  Palpation/Perucssion: Palpation and Percussion of the abdomen reveal- Non Tender, No Rebound tenderness, No rigidity(Guarding) and No Palpable abdominal masses.  Liver:-Normal.  Spleen:- Normal.   Back- no cva tenderness.       Assessment & Plan:

## 2014-03-25 NOTE — Progress Notes (Signed)
Pre visit review using our clinic review tool, if applicable. No additional management support is needed unless otherwise documented below in the visit note. 

## 2014-03-25 NOTE — Assessment & Plan Note (Signed)
Resolved now. Pt notes if  avoids gluten and with zantac stomach feels better. Labs done last time were negative. Continue current regimen.If symptoms worsen or change then expand work up.

## 2014-03-25 NOTE — Patient Instructions (Signed)
Your urinary symptoms have improved. Your culture was negative. I offered ancillary studies and you declined.(by your history they don't seem necessary as well.) There are non bacterial causes of uti like symptoms. So if those symptoms return then would need to reculture your urine. And if negative for infection then refer you to urologist.  For your on and off abdominal discomfort continue to avoid gluten and take zantac. If your symptom worsen or change then can expand work up.

## 2014-05-06 LAB — OB RESULTS CONSOLE HIV ANTIBODY (ROUTINE TESTING): HIV: NONREACTIVE

## 2014-05-06 LAB — OB RESULTS CONSOLE ABO/RH: RH Type: POSITIVE

## 2014-05-06 LAB — OB RESULTS CONSOLE RUBELLA ANTIBODY, IGM: RUBELLA: IMMUNE

## 2014-05-06 LAB — OB RESULTS CONSOLE RPR: RPR: NONREACTIVE

## 2014-05-06 LAB — OB RESULTS CONSOLE ANTIBODY SCREEN: Antibody Screen: NEGATIVE

## 2014-05-06 LAB — OB RESULTS CONSOLE HEPATITIS B SURFACE ANTIGEN: Hepatitis B Surface Ag: NEGATIVE

## 2014-05-28 LAB — OB RESULTS CONSOLE GC/CHLAMYDIA
Chlamydia: NEGATIVE
Gonorrhea: NEGATIVE

## 2014-06-19 NOTE — L&D Delivery Note (Addendum)
Delivery Note  First Stage: Labor onset: 12/17/2014 1000 Augmentation : AROM Analgesia /Anesthesia intrapartum: water immersion AROM at 1916 - thick meconium End of water immersion at 1910 d/t Thick meconium stained fluid  Second Stage: Complete dilation at 2054 Onset of pushing at 2215 FHR second stage 140  NICU team called to delivery on stand-by for resuscitation d/t thick meconium - not needed Delivery of a viable female at 0007 by CNM in LOA position with compound LT hand Loose nuchal cord x 3 Terminal Meconium and baby's first void at time of delivery Cord double clamped after cessation of pulsation, cut by FOB Cord blood sample collected    Third Stage: Placenta delivered via Tomasa BlaseSchultz intact with 3 VC @ 0024 Placenta disposition: L&D then home with patient for encapsulation Uterine tone firm / bleeding moderate & brisk with clots IM Pitocin and Cytotec 800 mcg pr  1st degree RT labia minora laceration identified  Anesthesia for repair: 1% lidocaine Repair 3.0 and 4.0 vicryl Est. Blood Loss (mL): 450  Complications: thick meconium stained fluid  Mom to postpartum.  Baby to Couplet care / Skin to Skin.  Newborn: Birth Weight: 7 lbs 10.6 oz  Apgar Scores: 8/9 Feeding planned: breast  Raelyn MoraAWSON, Racquelle Hyser, M  MSN, CNM 12/18/2014, 1:29 AM

## 2014-09-11 ENCOUNTER — Other Ambulatory Visit: Payer: Self-pay | Admitting: Internal Medicine

## 2014-11-19 LAB — OB RESULTS CONSOLE GBS: GBS: NEGATIVE

## 2014-12-17 ENCOUNTER — Encounter (HOSPITAL_COMMUNITY): Payer: Self-pay | Admitting: *Deleted

## 2014-12-17 ENCOUNTER — Inpatient Hospital Stay (HOSPITAL_COMMUNITY)
Admission: AD | Admit: 2014-12-17 | Discharge: 2014-12-19 | DRG: 775 | Disposition: A | Discharge status: Home / Self Care | Payer: BLUE CROSS/BLUE SHIELD | Source: Ambulatory Visit | Attending: Obstetrics & Gynecology | Admitting: Obstetrics & Gynecology

## 2014-12-17 DIAGNOSIS — O480 Post-term pregnancy: Principal | ICD-10-CM | POA: Diagnosis present

## 2014-12-17 DIAGNOSIS — Z8249 Family history of ischemic heart disease and other diseases of the circulatory system: Secondary | ICD-10-CM

## 2014-12-17 DIAGNOSIS — Z833 Family history of diabetes mellitus: Secondary | ICD-10-CM

## 2014-12-17 DIAGNOSIS — IMO0001 Reserved for inherently not codable concepts without codable children: Secondary | ICD-10-CM

## 2014-12-17 DIAGNOSIS — Z3A41 41 weeks gestation of pregnancy: Secondary | ICD-10-CM | POA: Diagnosis present

## 2014-12-17 HISTORY — DX: 41 weeks gestation of pregnancy: Z3A.41

## 2014-12-17 HISTORY — DX: Post-term pregnancy: O48.0

## 2014-12-17 HISTORY — DX: Anxiety disorder, unspecified: F41.9

## 2014-12-17 LAB — TYPE AND SCREEN
ABO/RH(D): B POS
Antibody Screen: NEGATIVE

## 2014-12-17 LAB — CBC
HEMATOCRIT: 37.7 % (ref 36.0–46.0)
HEMOGLOBIN: 13.1 g/dL (ref 12.0–15.0)
MCH: 31.2 pg (ref 26.0–34.0)
MCHC: 34.7 g/dL (ref 30.0–36.0)
MCV: 89.8 fL (ref 78.0–100.0)
PLATELETS: 194 10*3/uL (ref 150–400)
RBC: 4.2 MIL/uL (ref 3.87–5.11)
RDW: 13.3 % (ref 11.5–15.5)
WBC: 18.2 10*3/uL — ABNORMAL HIGH (ref 4.0–10.5)

## 2014-12-17 LAB — ABO/RH: ABO/RH(D): B POS

## 2014-12-17 MED ORDER — OXYCODONE-ACETAMINOPHEN 5-325 MG PO TABS: 1.0000 | ORAL_TABLET | ORAL | Status: DC | PRN

## 2014-12-17 MED ORDER — ACETAMINOPHEN 325 MG PO TABS: 650.0000 mg | ORAL_TABLET | ORAL | Status: DC | PRN

## 2014-12-17 MED ORDER — OXYTOCIN 10 UNIT/ML IJ SOLN
10.0000 [IU] | Freq: Once | INTRAMUSCULAR | Status: AC | PRN
  Filled 2014-12-17: qty 1

## 2014-12-17 MED ORDER — OXYCODONE-ACETAMINOPHEN 5-325 MG PO TABS: 2.0000 | ORAL_TABLET | ORAL | Status: DC | PRN

## 2014-12-17 MED ORDER — CITRIC ACID-SODIUM CITRATE 334-500 MG/5ML PO SOLN: 30.0000 mL | ORAL | Status: DC | PRN

## 2014-12-17 MED ORDER — LIDOCAINE HCL (PF) 1 % IJ SOLN
30.0000 mL | INTRAMUSCULAR | Status: DC | PRN
  Administered 2014-12-18: 30 mL via SUBCUTANEOUS
  Filled 2014-12-17: qty 30

## 2014-12-17 NOTE — MAU Note (Signed)
Admit pt.

## 2014-12-17 NOTE — MAU Note (Signed)
Pt states that Marlinda Mikeanya Bailey will be delivering her, Wiliam Ke Bailey CNM to be called as patient is in active labor.

## 2014-12-17 NOTE — Progress Notes (Addendum)
Patient ID: Brandi Hall, female   DOB: 12/21/1990, 24 y.o.   MRN: [id number] , S: Doing well, pain managed with water immersion x 1 hour. Intermittent pelvic pressure with contractions.   O: Filed Vitals:   12/17/14 1710  BP: 138/81  Pulse: 84  Temp: 98.1 F (36.7 C)  TempSrc: Oral  Resp: 20  Height: 5\' 5"  (1.651 m)  Weight: 80.74 kg (178 lb)     FHT:  FHR: 140 bpm, variability: moderate,  accelerations:  Present,  decelerations:  Present variables UC:   regular, every 2-4 minutes SVE:   Dilation: 9 Effacement (%): 100 Station: 0 / BBOW Exam by:: Arita Missawson, CNM  AROM: copious amounts of thick meconium   A / P: Spontaneous labor, progressing normally Thick Meconium  Fetal Wellbeing:  Category I Pain Control:  Labor support without medications Start continuous EFM  Anticipated MOD:  NSVD  Out of watertub @ 1910 Discussed with pt that she is no longer candidate for water immersion or birth d/t thick meconium stained fluid - pt, spouse and doula verbalize understanding *Dr. Seymour BarsLavoie notified of thick meconium stained fluid and no longer candidate for water immersion/birth - agrees with plan  Raelyn MoraAWSON, Shaunae Sieloff, M MSN, CNM 12/17/2014, 7:23 PM

## 2014-12-17 NOTE — MAU Note (Signed)
Pt states that contractions began at 1000. Pt denies LOF and states that baby has been active.

## 2014-12-17 NOTE — MAU Note (Signed)
Message left, R Arita MissDawson called

## 2014-12-17 NOTE — Progress Notes (Signed)
Patient ID: [name], female   DOB: 11/21/1990, 24 y.o.   MRN: [id number] S: Doing well, pain well-controlled with breathing technique, family & doula support. Increasing pelvic pressure with a mild urge to push.   O: Filed Vitals:   12/17/14 1710 12/17/14 1921  BP: 138/81   Pulse: 84   Temp: 98.1 F (36.7 C) 98.4 F (36.9 C)  TempSrc: Oral Oral  Resp: 20 20  Height: 5\' 5"  (1.651 m)   Weight: 80.74 kg (178 lb)      FHT:  FHR: 140 bpm, variability: moderate,  accelerations:  Present,  decelerations:  Present early UC:   regular, every 2-4 minutes SVE:   Dilation: 10 Effacement (%): 100 Station: +2 Exam by:: Arita Miss. Shatara Stanek CNM Thick Meconium Fluid  A / P: Spontaneous labor, progressing normally  Fetal Wellbeing:  Category I Pain Control:  Labor support without medications  Anticipated MOD:  NSVD  Raelyn MoraAWSON, Lorena Benham, Judie PetitM MSN, CNM 12/17/2014, 9:00 PM

## 2014-12-17 NOTE — Plan of Care (Signed)
Problem: Phase I Progression Outcomes Goal: Pain controlled with appropriate interventions Outcome: Completed/Met Date Met:  12/17/14 Use of water immersion, breathing and therapeutic touch for pain control.

## 2014-12-17 NOTE — H&P (Signed)
OB ADMISSION/ HISTORY & PHYSICAL:  Admission Date: 12/17/2014  4:33 PM  Admit Diagnosis: Active Labor / Postdates Pregnancy  Brandi Hall is a 24 y.o. female presenting for contracting since 1000 this morning.  Prenatal History: G1P0   EDC : 12/16/2014, by Last Menstrual Period  Prenatal care at [facility] since 7.[redacted] weeks gestation Primary Care Provider at [facility]: Marlinda Mike, CNM  Prenatal course complicated by postdates pregnancy  Prenatal Labs: ABO, Rh: B (11/18 0000)  Antibody: NEG (06/30 1741) Rubella: Immune (11/18 0000)  RPR: Nonreactive (11/18 0000)  HBsAg: Negative (11/18 0000)  HIV: Non-reactive (11/18 0000)  GBS: Negative (06/02 0000)  1 hr Glucola : Normal - 123 mg/dL   Medical / Surgical History :  Past medical history:  Past Medical History  Diagnosis Date  . Lymph node abscess     hosp for infected lymph node (Salmonella) at 24 y/o  . Asthma   . Allergic rhinitis   . Anxiety      Past surgical history:  Past Surgical History  Procedure Laterality Date  . Lad bx       Family History:  Family History  Problem Relation Age of Onset  . Breast cancer Mother     Breast  . Uterine cancer Maternal Aunt   . Colon cancer Other     Colon  . Diabetes Other     GGF  . Coronary artery disease    . Congestive Heart Failure       Social History:  reports that she has never smoked. She has never used smokeless tobacco. She reports that she drinks alcohol. She reports that she does not use illicit drugs.   Allergies: Review of patient's allergies indicates no known allergies.    Current Medications at time of admission:  Prescriptions prior to admission  Medication Sig Dispense Refill Last Dose  . albuterol (VENTOLIN HFA) 108 (90 BASE) MCG/ACT inhaler Inhale 2 puffs into the lungs every 6 (six) hours as needed for wheezing. 1 Inhaler 1 Taking  . fluticasone (FLONASE) 50 MCG/ACT nasal spray Place 2 sprays into the nose  daily.   Taking  . Fluticasone-Salmeterol (ADVAIR DISKUS) 100-50 MCG/DOSE AEPB Inhale 1 puff into the lungs 2 (two) times daily. OVERDUE FOR APPT WITH DR PAZ, NO FURTHER REFILLS [phone]. 60 each 0   . loratadine (CLARITIN) 10 MG tablet Take 10 mg by mouth daily.   Taking  . naproxen (NAPROSYN) 500 MG tablet Take 1 tablet by mouth 2x per day with food for 1 week then as needed. 30 tablet 0 Not Taking  . nitrofurantoin, macrocrystal-monohydrate, (MACROBID) 100 MG capsule Take 1 capsule (100 mg total) by mouth 2 (two) times daily. 14 capsule 0 Not Taking  . Norgestim-Eth Estrad Triphasic (ORTHO TRI-CYCLEN, 28, PO) Take by mouth.   Taking  . phenazopyridine (PYRIDIUM) 200 MG tablet Take 1 tablet (200 mg total) by mouth 3 (three) times daily as needed for pain. 6 tablet 0 Not Taking  . ranitidine (ZANTAC) 150 MG tablet Take 1 tablet (150 mg total) by mouth 2 (two) times daily. 60 tablet 0 Taking      Review of Systems: Review of Systems  Constitutional: Negative.   HENT: Negative.   Eyes: Negative.   Respiratory: Negative.   Cardiovascular: Negative.   Gastrointestinal: Positive for nausea.       With contractions  Genitourinary:       UC's every 5 mins; (+) FM  Musculoskeletal: Negative.  Skin: Negative.   Neurological: Negative.   Endo/Heme/Allergies: Negative.   Psychiatric/Behavioral: Negative.     Physical Exam:  Dilation: 10 Effacement (%): 100 Station: +2 Exam by:: Arita Miss. Kateline Kinkade CNM   Today's Vitals   12/17/14 1710 12/17/14 1741 12/17/14 1921  BP: 138/81    Pulse: 84    Temp: 98.1 F (36.7 C)  98.4 F (36.9 C)  TempSrc: Oral  Oral  Resp: 20  20  Height: 5\' 5"  (1.651 m)    Weight: 80.74 kg (178 lb)    PainSc:  9     General: A&O x 3, NAD Heart: RRR, no murmurs Lungs: CTAB Abdomen: Gravid, soft, non tender, normal bowel sounds Extremities: Atraumatic, normal ROM, no edema Genitalia / VE: Deferred at this time - see triage RN VE above FHR: 140 bpm / moderate /  accels present / variable and early decels present TOCO: regular every  2-5 mins In water tub @ 1752 / water temp 99.7  Labs:     Recent Labs  12/17/14 1741  WBC 18.2*  HGB 13.1  HCT 37.7  PLT 194     Assessment:  24 y.o. G1P0 at 6429w1d  1. Labor: active 2. Fetal Wellbeing: Category 1  3. Pain Control: planning water immersion 4. GBS: Negative 5. Postterm  Plan:  1. Admit to BS 2. Routine L&D orders 3. Water Immersion/Birth     Dr. Seymour BarsLavoie notified of admission / plan of care    Kenard GowerDAWSON, Amahd Morino, M, MSN, CNM 12/17/2014, 9:21 PM

## 2014-12-18 ENCOUNTER — Encounter (HOSPITAL_COMMUNITY): Payer: Self-pay | Admitting: Obstetrics and Gynecology

## 2014-12-18 DIAGNOSIS — Z3A41 41 weeks gestation of pregnancy: Secondary | ICD-10-CM

## 2014-12-18 DIAGNOSIS — O480 Post-term pregnancy: Secondary | ICD-10-CM

## 2014-12-18 HISTORY — DX: Post-term pregnancy: O48.0

## 2014-12-18 HISTORY — DX: 41 weeks gestation of pregnancy: Z3A.41

## 2014-12-18 LAB — CBC
HCT: 32.5 % — ABNORMAL LOW (ref 36.0–46.0)
Hemoglobin: 11.4 g/dL — ABNORMAL LOW (ref 12.0–15.0)
MCH: 31.4 pg (ref 26.0–34.0)
MCHC: 35.1 g/dL (ref 30.0–36.0)
MCV: 89.5 fL (ref 78.0–100.0)
Platelets: 235 10*3/uL (ref 150–400)
RBC: 3.63 MIL/uL — ABNORMAL LOW (ref 3.87–5.11)
RDW: 13.3 % (ref 11.5–15.5)
WBC: 20.8 10*3/uL — AB (ref 4.0–10.5)

## 2014-12-18 LAB — RPR: RPR Ser Ql: NONREACTIVE

## 2014-12-18 MED ORDER — ACETAMINOPHEN 325 MG PO TABS: 650.0000 mg | ORAL_TABLET | ORAL | Status: DC | PRN

## 2014-12-18 MED ORDER — MOMETASONE FURO-FORMOTEROL FUM 100-5 MCG/ACT IN AERO
2.0000 | INHALATION_SPRAY | Freq: Two times a day (BID) | RESPIRATORY_TRACT | Status: DC
  Filled 2014-12-18: qty 8.8

## 2014-12-18 MED ORDER — LORATADINE 10 MG PO TABS
10.0000 mg | ORAL_TABLET | Freq: Every day | ORAL | Status: DC
  Administered 2014-12-18 – 2014-12-19 (×2): 10 mg via ORAL
  Filled 2014-12-18 (×2): qty 1

## 2014-12-18 MED ORDER — ONDANSETRON HCL 4 MG/2ML IJ SOLN: 4.0000 mg | INTRAMUSCULAR | Status: DC | PRN

## 2014-12-18 MED ORDER — OXYCODONE-ACETAMINOPHEN 5-325 MG PO TABS: 1.0000 | ORAL_TABLET | ORAL | Status: DC | PRN

## 2014-12-18 MED ORDER — MISOPROSTOL 200 MCG PO TABS
800.0000 ug | ORAL_TABLET | Freq: Once | ORAL | Status: AC
  Administered 2014-12-18: 800 ug via RECTAL

## 2014-12-18 MED ORDER — OXYCODONE-ACETAMINOPHEN 5-325 MG PO TABS: 2.0000 | ORAL_TABLET | ORAL | Status: DC | PRN

## 2014-12-18 MED ORDER — PRENATAL MULTIVITAMIN CH
1.0000 | ORAL_TABLET | Freq: Every day | ORAL | Status: DC
  Administered 2014-12-18 – 2014-12-19 (×2): 1 via ORAL
  Filled 2014-12-18 (×2): qty 1

## 2014-12-18 MED ORDER — SENNOSIDES-DOCUSATE SODIUM 8.6-50 MG PO TABS
2.0000 | ORAL_TABLET | ORAL | Status: DC
Start: 2014-12-19 — End: 2014-12-19
  Administered 2014-12-18: 2 via ORAL
  Filled 2014-12-18: qty 2

## 2014-12-18 MED ORDER — MISOPROSTOL 200 MCG PO TABS
ORAL_TABLET | ORAL | Status: AC
  Filled 2014-12-18: qty 4

## 2014-12-18 MED ORDER — DIBUCAINE 1 % RE OINT
1.0000 "application " | TOPICAL_OINTMENT | RECTAL | Status: DC | PRN
  Administered 2014-12-19: 1 via RECTAL
  Filled 2014-12-18: qty 28

## 2014-12-18 MED ORDER — OXYTOCIN 10 UNIT/ML IJ SOLN
10.0000 [IU] | Freq: Once | INTRAMUSCULAR | Status: AC
  Administered 2014-12-18: 10 [IU] via INTRAMUSCULAR

## 2014-12-18 MED ORDER — TETANUS-DIPHTH-ACELL PERTUSSIS 5-2.5-18.5 LF-MCG/0.5 IM SUSP: 0.5000 mL | Freq: Once | INTRAMUSCULAR | Status: DC

## 2014-12-18 MED ORDER — IBUPROFEN 600 MG PO TABS
600.0000 mg | ORAL_TABLET | Freq: Four times a day (QID) | ORAL | Status: DC
  Administered 2014-12-18 – 2014-12-19 (×6): 600 mg via ORAL
  Filled 2014-12-18 (×6): qty 1

## 2014-12-18 MED ORDER — METHYLERGONOVINE MALEATE 0.2 MG PO TABS
0.2000 mg | ORAL_TABLET | ORAL | Status: DC | PRN
Start: 2014-12-18 — End: 2014-12-18

## 2014-12-18 MED ORDER — WITCH HAZEL-GLYCERIN EX PADS
1.0000 "application " | MEDICATED_PAD | CUTANEOUS | Status: DC | PRN
  Administered 2014-12-18: 1 via TOPICAL

## 2014-12-18 MED ORDER — FLUTICASONE PROPIONATE 50 MCG/ACT NA SUSP
2.0000 | Freq: Every day | NASAL | Status: DC
  Administered 2014-12-19: 2 via NASAL
  Filled 2014-12-18: qty 16

## 2014-12-18 MED ORDER — SIMETHICONE 80 MG PO CHEW: 80.0000 mg | CHEWABLE_TABLET | ORAL | Status: DC | PRN

## 2014-12-18 MED ORDER — ALBUTEROL SULFATE (2.5 MG/3ML) 0.083% IN NEBU: 3.0000 mL | INHALATION_SOLUTION | Freq: Four times a day (QID) | RESPIRATORY_TRACT | Status: DC | PRN

## 2014-12-18 MED ORDER — BENZOCAINE-MENTHOL 20-0.5 % EX AERO
1.0000 "application " | INHALATION_SPRAY | CUTANEOUS | Status: DC | PRN
  Administered 2014-12-18 – 2014-12-19 (×2): 1 via TOPICAL
  Filled 2014-12-18 (×2): qty 56

## 2014-12-18 MED ORDER — ZOLPIDEM TARTRATE 5 MG PO TABS: 5.0000 mg | ORAL_TABLET | Freq: Every evening | ORAL | Status: DC | PRN

## 2014-12-18 MED ORDER — DIPHENHYDRAMINE HCL 25 MG PO CAPS: 25.0000 mg | ORAL_CAPSULE | Freq: Four times a day (QID) | ORAL | Status: DC | PRN

## 2014-12-18 MED ORDER — ONDANSETRON HCL 4 MG PO TABS: 4.0000 mg | ORAL_TABLET | ORAL | Status: DC | PRN

## 2014-12-18 MED ORDER — METHYLERGONOVINE MALEATE 0.2 MG/ML IJ SOLN: 0.2000 mg | INTRAMUSCULAR | Status: DC | PRN

## 2014-12-18 NOTE — Progress Notes (Signed)
Patient ID: Glennon HamiltonJulianna E Louis, female   DOB: 07/23/1990, 24 y.o.   MRN: [id number] [place] [facility], RN requesting CNM to come for delivery / "fetal head seen at introitus even without pushing, crowning nicely" / patient with stronger urge to push S: Doing well, pain well-controlled with breathing in between contractions. (+) pelvic pressure with each contraction.   O: Filed Vitals:   12/18/14 0045 12/18/14 0100 12/18/14 0115 12/18/14 0130  BP: 132/73 111/60 131/79 126/87  Pulse: 115 115 118 123  Temp:   99.6 F (37.6 C)   TempSrc:   Oral   Resp: 16 16 16 16   Height:      Weight:         FHT:  FHR: 140 bpm, variability: moderate,  accelerations:  Present,  decelerations:  Present variables UC:   regular, every 2-5 minutes SVE:   Dilation: 10 Effacement (%): 100 Station: +2 Exam by:: Arita Miss. Quinita Kostelecky CNM Thick meconium  A / P: Spontaneous labor, progressing normally  Fetal Wellbeing:  Category I Pain Control:  Labor support without medications  Anticipated MOD:  NSVD  Raelyn MoraAWSON, Ashon Rosenberg, Judie PetitM MSN, CNM 12/17/2014, 10:00 PM

## 2014-12-18 NOTE — Progress Notes (Signed)
PPD 1 SVD  S:  Reports feeling well- really tired and bottom sore             Tolerating po/ No nausea or vomiting              Bleeding is light             Pain controlled with motrin and percocet             Up ad lib / ambulatory / voiding QS  Newborn breast feeding  O:               VS: BP 115/69 mmHg  Pulse 112  Temp(Src) 98.4 F (36.9 C) (Oral)  Resp 18  Ht 5\' 5"  (1.651 m)  Wt 80.74 kg (178 lb)  BMI 29.62 kg/m2  SpO2 98%  LMP 03/11/2014  Breastfeeding? Unknown   LABS:              Recent Labs  12/17/14 1741 12/18/14 0550  WBC 18.2* 20.8*  HGB 13.1 11.4*  PLT 194 235               Blood type: --/--/B POS, B POS (06/30 1741)  Rubella: Immune (11/18 0000)                     I&O: Intake/Output      06/30 0701 - 07/01 0700 07/01 0701 - 07/02 0700   Urine (mL/kg/hr) 0    Blood 515    Total Output 515     Net -515          Urine Occurrence 3 x                  Physical Exam:             Alert and oriented X3  Abdomen: soft, non-tender, non-distended              Fundus: firm, non-tender, Ueven  Perineum: moderate edema  Lochia: moderate  Extremities: trace edema, no calf pain or tenderness    A: PPD # 1   Doing well - stable status  P: Routine post partum orders  DC tomorrow  Marlinda MikeBAILEY, Tyrian Peart CNM, MSN, [facility] Hospital[facility]ACNM 12/18/2014, 10:04 AM

## 2014-12-18 NOTE — Progress Notes (Signed)
CLINICAL SOCIAL WORK MATERNAL/CHILD NOTE  Patient Details  Name: Brandi Hall MRN: [id number] Date of Birth: 12/18/2014  Date:  12/18/2014  Clinical Social Worker Initiating Note:  Loleta BooksSarah Sesar Madewell, LCSW Date/ Time Initiated:  12/18/14/1100     Child's Name:  Brandi Hall   Legal Guardian:  Brandi BorosJulianna and Brandi PlateMarc Hall (parents)  Need for Interpreter:  None   Date of Referral:  12/18/14     Reason for Referral:  History of anxiety   [facility]   Address:  [address] [place], [place] [address]  Phone number:  [phone]   Household Members:  Spouse   Natural Supports (not living in the home):  Immediate Family, Extended Family   Professional Supports: None   Employment:   Employed  Type of Work:   DietitianDance instructor  Education:    N/A  Architectinancial Resources:  Media plannerrivate Insurance   Other Resources:    None identified  Cultural/Religious Considerations Which May Impact Care:  None reported  Strengths:  Ability to meet basic needs , Home prepared for child    Risk Factors/Current Problems:   1) History of anxiety: MOB presents with history of anxiety, with worsening symptoms of anxiety during the pregnancy due to increased work stress. MOB shared that she started therapy in May and intends to follow up with her thearpist postpartum.   Cognitive State:  Able to Concentrate , Alert , Insightful , Linear Thinking    Mood/Affect:  Calm , Comfortable , Euthymic    CSW Assessment:  CSW received request for consult due to MOB presenting with a history of anxiety during the pregnancy.  MOB presented as easily engaged and receptive to the visit. She shared that she is "exhausted", affect and mood congruent for the setting.  MOB did not present with any acute symptoms of anxiety, and she discussed intentions to be flexible in her thinking as she transitions to the postpartum period.   CSW provided support as MOB reflected upon her transition to the  postpartum period. She stated that she had been anticipating and planning a water birth, but the plan could not be implemented due to thick meconium.  She discussed some feelings of disappointment, but also shared that she intends to be flexible and open to plans changing.  MOB acknowledged importance of expressing how she feels since her vision and goals of giving birth are important and can have lasting impacts on the MOB.    MOB acknowledged history of anxiety. She stated that she noted an increase in anxiety during the pregnancy, and reflected upon the belief that it was related to work.  She reported that she initiated therapy in May, and feels well supported by her therapist. Per MOB, she has learned cognitive techniques and emotional regulation skills that assist her when she begins to feel anxious and overwhelmed.  MOB denied that work related anxiety caused insomnia or had an intense presence once she was at home.  MOB also denied any anxiety related to preparing for the infant's arrival or her sense of self as she prepares to become a mother.  MOB acknowledged increased risk for developing postpartum depression/anxiety, and voiced intention to follow up with her therapist postpartum partum.  MOB shared that she intends to continue to be in contact with her medical providers and therapist about how she is feeling.    MOB denied current questions, concerns, or needs at this time. She expressed appreciation for the visit and agreed to contact  the CSW if specific needs arise.  CSW Plan/Description:   1)Patient/Family Education: Perinatal mood and anxiety disorders 2)No Further Intervention Required/No Barriers to Discharge    Kelby Fam 12/18/2014, 12:12 PM

## 2014-12-19 MED ORDER — IBUPROFEN 600 MG PO TABS: 600.0000 mg | ORAL_TABLET | Freq: Four times a day (QID) | ORAL | Status: DC

## 2014-12-19 NOTE — Discharge Summary (Signed)
Obstetric Discharge Summary Reason for Admission: onset of labor and 41 weeks, post dates Prenatal Procedures: postdates Intrapartum Procedures: spontaneous vaginal delivery Postpartum Procedures: none Complications-Operative and Postpartum: 1st degree and labial laceration HEMOGLOBIN  Date Value Ref Range Status  12/18/2014 11.4* 12.0 - 15.0 g/dL Final   HCT  Date Value Ref Range Status  12/18/2014 32.5* 36.0 - 46.0 % Final    Physical Exam:  General: alert, cooperative and no distress Lochia: appropriate Uterine Fundus: firm Incision: healing well, no significant drainage, no dehiscence, no significant erythema DVT Evaluation: No evidence of DVT seen on physical exam. Negative Homan's sign. No cords or calf tenderness. No significant calf/ankle edema.  Discharge Diagnoses: Term Pregnancy-delivered  Discharge Information: Date: 12/19/2014 Activity: pelvic rest Diet: routine Medications: PNV and Ibuprofen Condition: stable Instructions: refer to practice specific booklet Discharge to: home Follow-up Information    Follow up with Marlinda MikeBAILEY, TANYA, CNM. Schedule an appointment as soon as possible for a visit in 6 weeks.   Specialty:  Obstetrics and Gynecology   Contact information:   [address] [place] [address] [phone]       Newborn Data: Live born female on 12/18/14 Birth Weight: 7 lb 10.6 oz (3476 g) APGAR: 8, 9  Home with mother.  Everett Ehrler, N 12/19/2014, 12:07 PM

## 2014-12-19 NOTE — Lactation Note (Signed)
This note was copied from the chart of Brandi CongoJulianna Faley. Lactation Consultation Note: Observed latch- baby doing some clicking at the breast but none once repositioned. Mom reports breasts are feeling fuller this morning. Reports baby cluster fed through the night. Reassurance given. Asking about bottle feeding- reviewed with parents. No further questions at present. Reviewed OP appointments and BFSG as resources for support after DC. To call prn  Patient Name: Brandi Renee RamusJulianna Manas ZOXWR'UToday's Date: 12/19/2014 Reason for consult: Follow-up assessment   Maternal Data Formula Feeding for Exclusion: No Has patient been taught Hand Expression?: Yes Does the patient have breastfeeding experience prior to this delivery?: No  Feeding Feeding Type: Breast Fed Length of feed: 15 min  LATCH Score/Interventions Latch: Grasps breast easily, tongue down, lips flanged, rhythmical sucking.  Audible Swallowing: A few with stimulation  Type of Nipple: Everted at rest and after stimulation  Comfort (Breast/Nipple): Soft / non-tender     Hold (Positioning): Assistance needed to correctly position infant at breast and maintain latch. Intervention(s): Breastfeeding basics reviewed  LATCH Score: 8  Lactation Tools Discussed/Used WIC Program: No   Consult Status Consult Status: Complete    Pamelia HoitWeeks, Yasin Ducat D 12/19/2014, 12:37 PM

## 2014-12-19 NOTE — Progress Notes (Signed)
PPD #1- SVD  Subjective:   Reports feeling ok, bottom swollen and sore, desires early discharge Tolerating po/ No nausea or vomiting Bleeding is light Pain controlled with Motrin Up ad lib / ambulatory / voiding without problems Newborn: breastfeeding     Objective:   VS: VS:  Filed Vitals:   12/18/14 0334 12/18/14 0733 12/18/14 1832 12/19/14 0700  BP: 114/66 115/69 132/64 95/57  Pulse: 116 112 105 80  Temp: 99.4 F (37.4 C) 98.4 F (36.9 C) 98.6 F (37 C) 98.4 F (36.9 C)  TempSrc: Oral  Oral   Resp: 16 18 18 16   Height:      Weight:      SpO2: 96% 98%      LABS:  Recent Labs  12/17/14 1741 12/18/14 0550  WBC 18.2* 20.8*  HGB 13.1 11.4*  PLT 194 235   Blood type: --/--/B POS, B POS (06/30 1741) Rubella: Immune (11/18 0000)                I&O: Intake/Output      07/01 0701 - 07/02 0700 07/02 0701 - 07/03 0700   Urine (mL/kg/hr)     Blood     Total Output       Net              Physical Exam: Alert and oriented X3 Abdomen: soft, non-tender, non-distended  Fundus: firm, non-tender, U-2 Perineum: Well approximated, no significant erythema, or drainage; 4+ labial edema; healing well. +moderate hemorrhoid cluster Lochia: small Extremities: no edema, no calf pain or tenderness    Assessment: PPD #1  G1P1001/ S/P:spontaneous vaginal, 1st degree laceration, labial laceration Doing well - stable for discharge home   Plan: Discharge home RX's:  Ibuprofen 600mg  po Q 6 hrs prn pain #30 Refill x 0 Dibucaine ointment-prn (tube given by RN) Declines Percocet Rx [facility] given    Donette LarryBHAMBRI, Erika Slaby, N MSN, CNM 12/19/2014, 12:03 PM

## 2014-12-30 ENCOUNTER — Ambulatory Visit (HOSPITAL_COMMUNITY): Payer: BLUE CROSS/BLUE SHIELD

## 2015-02-19 ENCOUNTER — Other Ambulatory Visit: Payer: Self-pay

## 2015-02-19 ENCOUNTER — Telehealth: Payer: Self-pay | Admitting: Internal Medicine

## 2015-02-19 MED ORDER — FLUTICASONE-SALMETEROL 100-50 MCG/DOSE IN AEPB: 1.0000 | INHALATION_SPRAY | Freq: Two times a day (BID) | RESPIRATORY_TRACT | Status: DC

## 2015-02-19 NOTE — Telephone Encounter (Signed)
Caller name: Brandi Hall Relationship to patient: Self Can be reached: [phone] Pharmacy:  Reason for call: Pt has CPE scheduled for 06/30/15. Pt would like to know if she can get a supply of her ADVAIR DISKUS medication until.

## 2015-02-19 NOTE — Telephone Encounter (Signed)
Advair sent to pharmacy

## 2015-03-03 ENCOUNTER — Ambulatory Visit (INDEPENDENT_AMBULATORY_CARE_PROVIDER_SITE_OTHER): Payer: BLUE CROSS/BLUE SHIELD | Admitting: Podiatry

## 2015-03-03 ENCOUNTER — Encounter: Payer: Self-pay | Admitting: Podiatry

## 2015-03-03 VITALS — BP 113/65 | HR 66 | Ht 65.0 in | Wt 152.0 lb

## 2015-03-03 DIAGNOSIS — L600 Ingrowing nail: Secondary | ICD-10-CM | POA: Insufficient documentation

## 2015-03-03 DIAGNOSIS — M79675 Pain in left toe(s): Secondary | ICD-10-CM | POA: Diagnosis not present

## 2015-03-03 DIAGNOSIS — M79605 Pain in left leg: Secondary | ICD-10-CM | POA: Insufficient documentation

## 2015-03-03 NOTE — Progress Notes (Signed)
SUBJECTIVE: 24 y.o. year old female presents complaining of ingrown nail left great toe. Has had problem during pregnancy. Baby is two months now and the toe is still tender in closed in shoes.   REVIEW OF SYSTEMS: A comprehensive review of systems was negative.  OBJECTIVE: DERMATOLOGIC EXAMINATION: Nails: ingrown nail medial border left hallux. No drainage or infection noted. Pain with closed in shoes. VASCULAR EXAMINATION OF LOWER LIMBS: Pedal pulses: All pedal pulses are palpable with normal pulsation.  No edema or erythema noted.  NEUROLOGIC EXAMINATION OF THE LOWER LIMBS: All epicritic and tactile sensations grossly intact.  MUSCULOSKELETAL EXAMINATION: No gross deformities.   ASSESSMENT: Painful ingrown nail left great toe medial border x 2 months.   PLAN: Affected left great toe was anesthetized with total 5ml mixture of 50/50 0.5% Marcaine plain and 1% Xylocaine plain. Affected medial nail border was reflected with a nail elevator and excised with nail nipper. Proximal nail matrix tissue was cauterized with Phenol soaked cotton applicator x 4 and neutralized with Alcohol soaked cotton applicator. The wound was dressed with Amerigel ointment dressing. Home care instructions and supply dispensed.  Return in 1 week for follow up.

## 2015-03-03 NOTE — Patient Instructions (Signed)
Ingrown nail surgery was done. Follow soaking instruction.  Some redness and drainage is expected. Call the office if the area gets feverish with increased redness and drainage. 

## 2015-03-10 ENCOUNTER — Ambulatory Visit (INDEPENDENT_AMBULATORY_CARE_PROVIDER_SITE_OTHER): Payer: BLUE CROSS/BLUE SHIELD | Admitting: Podiatry

## 2015-03-10 ENCOUNTER — Encounter: Payer: Self-pay | Admitting: Podiatry

## 2015-03-10 DIAGNOSIS — Z9889 Other specified postprocedural states: Secondary | ICD-10-CM

## 2015-03-10 NOTE — Patient Instructions (Signed)
Follow up on ingrown nail surgery left great toe. Healing without complication except tenderness on opposite border. Nail debrided. Continue to soak till pain stops.  Return as needed.

## 2015-03-10 NOTE — Progress Notes (Signed)
1 week post nail surgery. Noted of pain on lateral border of the same toe. No associated inflammation noted. Drainage is minimum. Debrided lateral border. Continue to soak till tenderness subside.

## 2015-05-27 ENCOUNTER — Other Ambulatory Visit: Payer: Self-pay | Admitting: Internal Medicine

## 2015-06-30 ENCOUNTER — Encounter: Payer: BLUE CROSS/BLUE SHIELD | Admitting: Internal Medicine

## 2015-07-14 ENCOUNTER — Telehealth: Payer: Self-pay | Admitting: General Practice

## 2015-07-14 NOTE — Telephone Encounter (Signed)
Called pt to complete Pre-visit information/LMOVM to return call.  

## 2015-07-15 ENCOUNTER — Encounter: Payer: Self-pay | Admitting: Internal Medicine

## 2015-07-15 ENCOUNTER — Ambulatory Visit (INDEPENDENT_AMBULATORY_CARE_PROVIDER_SITE_OTHER): Payer: BLUE CROSS/BLUE SHIELD | Admitting: Internal Medicine

## 2015-07-15 VITALS — BP 108/66 | HR 77 | Temp 97.6°F | Ht 65.0 in | Wt 151.5 lb

## 2015-07-15 DIAGNOSIS — Z0001 Encounter for general adult medical examination with abnormal findings: Secondary | ICD-10-CM | POA: Diagnosis not present

## 2015-07-15 DIAGNOSIS — G479 Sleep disorder, unspecified: Secondary | ICD-10-CM

## 2015-07-15 DIAGNOSIS — Z23 Encounter for immunization: Secondary | ICD-10-CM | POA: Diagnosis not present

## 2015-07-15 DIAGNOSIS — Z0000 Encounter for general adult medical examination without abnormal findings: Secondary | ICD-10-CM | POA: Diagnosis not present

## 2015-07-15 LAB — CBC WITH DIFFERENTIAL/PLATELET
BASOS PCT: 1.1 % (ref 0.0–3.0)
Basophils Absolute: 0.1 10*3/uL (ref 0.0–0.1)
Eosinophils Absolute: 0.6 10*3/uL (ref 0.0–0.7)
Eosinophils Relative: 9.2 % — ABNORMAL HIGH (ref 0.0–5.0)
HCT: 42.4 % (ref 36.0–46.0)
Hemoglobin: 14.1 g/dL (ref 12.0–15.0)
LYMPHS ABS: 2.6 10*3/uL (ref 0.7–4.0)
Lymphocytes Relative: 38.7 % (ref 12.0–46.0)
MCHC: 33.3 g/dL (ref 30.0–36.0)
MCV: 90.2 fl (ref 78.0–100.0)
MONO ABS: 0.3 10*3/uL (ref 0.1–1.0)
Monocytes Relative: 4.7 % (ref 3.0–12.0)
NEUTROS PCT: 46.3 % (ref 43.0–77.0)
Neutro Abs: 3.1 10*3/uL (ref 1.4–7.7)
Platelets: 203 10*3/uL (ref 150.0–400.0)
RBC: 4.7 Mil/uL (ref 3.87–5.11)
RDW: 12.2 % (ref 11.5–15.5)
WBC: 6.7 10*3/uL (ref 4.0–10.5)

## 2015-07-15 LAB — LIPID PANEL
Cholesterol: 160 mg/dL (ref 0–200)
HDL: 58.2 mg/dL (ref >39.00)
LDL Cholesterol: 91 mg/dL (ref 0–99)
NONHDL: 102.26
Total CHOL/HDL Ratio: 3
Triglycerides: 54 mg/dL (ref 0.0–149.0)
VLDL: 10.8 mg/dL (ref 0.0–40.0)

## 2015-07-15 LAB — COMPREHENSIVE METABOLIC PANEL
ALT: 15 U/L (ref 0–35)
AST: 18 U/L (ref 0–37)
Albumin: 4.6 g/dL (ref 3.5–5.2)
Alkaline Phosphatase: 62 U/L (ref 39–117)
BUN: 11 mg/dL (ref 6–23)
CHLORIDE: 108 meq/L (ref 96–112)
CO2: 25 mEq/L (ref 19–32)
Calcium: 9.2 mg/dL (ref 8.4–10.5)
Creatinine, Ser: 0.69 mg/dL (ref 0.40–1.20)
GFR: 109.9 mL/min (ref >60.00)
GLUCOSE: 88 mg/dL (ref 70–99)
POTASSIUM: 3.8 meq/L (ref 3.5–5.1)
Sodium: 140 mEq/L (ref 135–145)
TOTAL PROTEIN: 7 g/dL (ref 6.0–8.3)
Total Bilirubin: 0.8 mg/dL (ref 0.2–1.2)

## 2015-07-15 LAB — FERRITIN: Ferritin: 68 ng/mL (ref 10.0–291.0)

## 2015-07-15 LAB — VITAMIN B12: Vitamin B-12: 894 pg/mL (ref 211–911)

## 2015-07-15 LAB — FOLATE: Folate: >23.6 ng/mL (ref >5.9)

## 2015-07-15 LAB — IRON: Iron: 155 ug/dL — ABNORMAL HIGH (ref 42–145)

## 2015-07-15 LAB — TSH: TSH: 1.43 u[IU]/mL (ref 0.35–4.50)

## 2015-07-15 NOTE — Progress Notes (Signed)
Subjective:    Patient ID: Brandi Hall, female    DOB: [birth year], 25 y.o.   MRN: [id number]  DOS:  07/15/2015 Type of visit - description : CPX, here with her 3-month-old baby Interval history: Doing well, she has some concerns, see below. She is breast-feeding.   Review of Systems  Constitutional: No fever. No chills. No unexplained wt changes. No unusual sweats   HEENT: No dental problems, no ear discharge, no facial swelling, no voice changes. No eye discharge, no eye  redness , no  intolerance to light   Respiratory: No wheezing , no  difficulty breathing. No cough , no mucus production. Sleep disorder? Sx going on x > 4 years. She feels extremely physically fatigued, sleepy if she is not able to sleep 10-14 hours.  Cardiovascular: No CP, no leg swelling , no  Palpitations  GI: no nausea, no vomiting, no diarrhea , no  abdominal pain.  No blood in the stools. No dysphagia, no odynophagia    Endocrine: No polyphagia, no polyuria , no polydipsia  GU: No dysuria, gross hematuria, difficulty urinating. No urinary urgency, no frequency.  Musculoskeletal: No joint swellings or unusual aches or pains  Skin: Occasional erythema at both arms immediately after she takes a shower, symptoms decrease with Lavender oil. No actual rash   Allergic, immunologic: No environmental allergies , no  food allergies  Neurological: No dizziness no  syncope. No headaches. No diplopia, no slurred, no slurred speech, no motor deficits, no facial  Numbness  Hematological: No enlarged lymph nodes, no easy bruising , no unusual bleedings  Psychiatry: No suicidal ideas, no hallucinations, no beavior problems, no confusion.  No unusual/severe anxiety, no depression  Past Medical History  Diagnosis Date  . Lymph node abscess     hosp for infected lymph node (Salmonella) at 25 y/o  . Asthma   . Allergic rhinitis   . Anxiety   . Post term pregnancy, 41 weeks 12/18/2014  . Postpartum care  following vaginal delivery (7/1) 12/18/2014    Past Surgical History  Procedure Laterality Date  . Lad bx      Social History   Social History  . Marital Status: Married    Spouse Name: N/A  . Number of Children: 1  . Years of Education: N/A   Occupational History  . finished college, works, Runner, broadcasting/film/video    Social History Main Topics  . Smoking status: Never Smoker   . Smokeless tobacco: Never Used  . Alcohol Use: 0.0 oz/week    0 Standard drinks or equivalent per week     Comment: rarely  . Drug Use: No  . Sexual Activity: Not on file   Other Topics Concern  . Not on file   Social History Narrative         Family History  Problem Relation Age of Onset  . Breast cancer Mother     dx age late 64s  . Uterine cancer Maternal Aunt   . Colon cancer Other     Colon  . Diabetes Other     GGF  . Coronary artery disease    . Congestive Heart Failure    '    Medication List       This list is accurate as of: 07/15/15  5:12 PM.  Always use your most recent med list.               albuterol 108 (90 Base) MCG/ACT inhaler  Commonly known as:  VENTOLIN HFA  Inhale 2 puffs into the lungs every 6 (six) hours as needed for wheezing.     cetirizine 10 MG tablet  Commonly known as:  ZYRTEC  Take 10 mg by mouth daily.     Fluticasone-Salmeterol 100-50 MCG/DOSE Aepb  Commonly known as:  ADVAIR DISKUS  Inhale 1 puff into the lungs 2 (two) times daily.     ibuprofen 600 MG tablet  Commonly known as:  ADVIL,MOTRIN  Take 1 tablet (600 mg total) by mouth every 6 (six) hours.     loratadine 10 MG tablet  Commonly known as:  CLARITIN  Take 10 mg by mouth daily.     prenatal multivitamin Tabs tablet  Take 1 tablet by mouth daily at 12 noon.           Objective:   Physical Exam BP 108/66 mmHg  Pulse 77  Temp(Src) 97.6 F (36.4 C) (Oral)  Ht  (1.651 m)  Wt 151 lb 8 oz (68.72 kg)  BMI 25.21 kg/m2  SpO2 99%  Breastfeeding? Yes General:   Well developed,  well nourished . NAD.  Neck:  No  thyromegaly  HEENT:  Normocephalic . Face symmetric, atraumatic Lungs:  CTA B Normal respiratory effort, no intercostal retractions, no accessory muscle use. Heart: RRR,  no murmur.  No pretibial edema bilaterally  Skin: Exposed areas without rash. Not pale. Not jaundice Neurologic:  alert & oriented X3.  Speech normal, gait appropriate for age and unassisted Strength symmetric and appropriate for age.  Psych: Cognition and judgment appear intact.  Cooperative with normal attention span and concentration.  Behavior appropriate. No anxious or depressed appearing.    Assessment & Plan:   Assessment Anxiety Asthma H/o lymph node abscesses d/t Salmonella at age 62 Gluten intolerant, on a gluten free diet   PLAN: Asthma: Well-controlled with Advair one time daily, recommend to hold it until she stops breast-feeding. Fatigue: Fatigue and malaise unless she sleeps 10-14 hours a night, Epworth scale score 17 (positive!). Symptoms are chronic, not related to her 55 month old baby.Sleep d/o? Will check appropriate labs and agreed on a  sleep medicine referral. Recent vitamin D was normal per patient. RTC 1 year

## 2015-07-15 NOTE — Progress Notes (Signed)
Pre visit review using our clinic review tool, if applicable. No additional management support is needed unless otherwise documented below in the visit note. 

## 2015-07-15 NOTE — Patient Instructions (Signed)
BEFORE YOU LEAVE THE OFFICE:  GO TO THE LAB  Get the blood work    GO TO THE FRONT DESK Schedule a complete physical exam to be done in 1 year Please be fasting   

## 2015-07-15 NOTE — Assessment & Plan Note (Signed)
Td 20013;  declined flu shot  Okay to proceed with Hep B #2 Hold Menactra and Prevnar until she stops breast-feeding. Female care per gynecology Labs: CMP, CBC, iron, ferritin, FLP, TSH, B12, folic acid Diet and exercise discussed

## 2015-07-16 ENCOUNTER — Telehealth: Payer: Self-pay | Admitting: Internal Medicine

## 2015-07-16 DIAGNOSIS — Z23 Encounter for immunization: Secondary | ICD-10-CM

## 2015-07-16 NOTE — Telephone Encounter (Deleted)
Please advise 

## 2015-07-16 NOTE — Telephone Encounter (Signed)
Spoke with Pt, informed her that it is safe for her to continue breast feeding w/ Hep B injection.

## 2015-07-16 NOTE — Telephone Encounter (Signed)
Pt called to make sure she is ok breastfeeding due to getting hep b vaccine yesterday, please call back.

## 2015-07-28 ENCOUNTER — Ambulatory Visit (INDEPENDENT_AMBULATORY_CARE_PROVIDER_SITE_OTHER): Payer: BLUE CROSS/BLUE SHIELD | Admitting: Neurology

## 2015-07-28 ENCOUNTER — Encounter: Payer: Self-pay | Admitting: Neurology

## 2015-07-28 VITALS — BP 118/66 | HR 86 | Resp 20 | Ht 65.75 in | Wt 146.0 lb

## 2015-07-28 DIAGNOSIS — G4719 Other hypersomnia: Secondary | ICD-10-CM

## 2015-07-28 DIAGNOSIS — G47411 Narcolepsy with cataplexy: Secondary | ICD-10-CM | POA: Diagnosis not present

## 2015-07-28 NOTE — Patient Instructions (Signed)
Narcolepsy  Narcolepsy is a nervous system disorder that causes daytime sleepiness and sudden bouts of irresistible sleep during the day (sleep attacks). Many people with narcolepsy live with extreme daytime sleepiness for many years before being diagnosed and treated. Narcolepsy is a lifelong (chronic) disorder.   You normally go through cycles when you sleep. When your sleep becomes deeper, you have less body movement, and you start dreaming. This type of deep sleep should happen after about 90 minutes of lighter sleep. Deep sleep is called rapid eye movement (REM) sleep. When you have narcolepsy, REM is not well regulated. It can happen as soon as you fall asleep, and components of REM sleep can occur during the day when you are awake. Uncontrolled REM sleep causes symptoms of narcolepsy.  CAUSES  Narcolepsy may be caused by an abnormality with a chemical messenger (neurotransmitter) in your brain that controls your sleep and wake cycles. Most people with narcolepsy have low levels of the neurotransmitter hypocretin. Hypocretin is important for controlling wakefulness.   A hypocretin imbalance may be caused by abnormal genes that are passed down through families. It may also develop if the body's defense system (immune system) mistakenly attacks the brain cells that produce hypocretin (autoimmune disease).  RISK FACTORS  You may be at higher risk for narcolepsy if you have a family history of the disease. Other risk factors that may contribute include:  · Injuries, tumors, or infections in the areas of the brain that control sleep.  · Exposure to toxins.  · Stress.  · Hormones produced during puberty and menopause.  · Poor sleep habits.  SIGNS AND SYMPTOMS   Symptoms of narcolepsy can start at any age but usually begin when people are between the ages of 7 and 25 years. There are four major symptoms. Not everyone with narcolepsy will have all four.   · Excessive daytime sleepiness. This is the most common symptom  and is usually the first symptom you will notice.  ¨ You may feel as if you are in a mental fog.  ¨ Daytime sleepiness may severely affect your performance at work or school.  ¨ You may fall asleep during a conversation or while eating dinner.  · Sudden loss of muscle tone (cataplexy). You do not lose consciousness, but you may suddenly lose muscle control. When this occurs, your speech may become slurred, or your knees may buckle. This symptom is usually triggered by surprise, anger, or laughter.  · Sleep paralysis. You may lose the ability to speak or move just as you start to fall asleep or wake up. You will be aware of the paralysis. It usually lasts for just a few seconds or minutes.  · Vivid hallucinations. These may occur with sleep paralysis. The hallucinations are like having bizarre or frightening dreams while you are still awake.  Other symptoms may include:   · Trouble staying asleep at night (insomnia).  · Restless sleep.  · Feeling a strong urge to get up at night to smoke or eat.  DIAGNOSIS   Your health care provider can diagnose narcolepsy based on your symptoms and the results of two diagnostic tests. You may also be asked to keep a sleep diary for several weeks. You may need the tests if you have had daytime sleepiness for at least 3 months. The two tests are:   · A polysomnogram to find out how well your REM sleep is regulated at night. This test is an overnight sleep study. It measures your   heart rate, breathing, movement, and brain waves.  · A multiple sleep latency test (MSLT) to find out how well your REM sleep is regulated during the day. This is a daytime sleep study. You may need to take several naps during the day. This test also measures your heart rate, breathing, movement, and brain waves.  TREATMENT   There is no cure for narcolepsy, but treatment can be very effective in helping manage the condition. Treatment may include:  · Lifestyle and sleeping strategies to help cope with the  condition.  · Medicines. These may include:    Stimulant medicines to fight daytime sleepiness.    Antidepressant medicines to treat cataplexy.    Sodium oxybate. This is a strong sedative that you take at night. It can help daytime sleepiness and cataplexy.  HOME CARE INSTRUCTIONS  · Take all medicines as directed by your health care provider.  · Follow these sleep practices:  ¨ Try to get about 8 hours of sleep every night.  ¨ Go to sleep and get up close to the same time every day.  ¨ Keep your bedroom dark, quiet, and comfortable.  · Schedule short naps for when you feel sleepiest during the day. Tell your employer or teachers that you have narcolepsy. You may be able to adjust your schedule to include time for naps.  · Try to get at least 20 minutes of exercise every day. This will help you sleep better at night and reduce daytime sleepiness.  · Do not drink alcohol or caffeinated beverages within 4-5 hours of bedtime.  · Do not eat a heavy meal before bedtime. Eat at about the same times every day.  · Do not smoke.  · Do not drive if you are sleepy or have untreated narcolepsy.  · Do not swim or go out on the water without a life jacket.  SEEK MEDICAL CARE IF:  Your medicines are not controlling your narcolepsy symptoms.  SEEK IMMEDIATE MEDICAL CARE IF:  · You hurt yourself during a sleep attack or an attack of cataplexy.  · You have chest pain or trouble breathing.     This information is not intended to replace advice given to you by your health care provider. Make sure you discuss any questions you have with your health care provider.     Document Released: 05/26/2002 Document Revised: 06/26/2014 Document Reviewed: 05/28/2013  Elsevier Interactive Patient Education ©2016 [employer]

## 2015-07-28 NOTE — Progress Notes (Signed)
SLEEP MEDICINE CLINIC   Provider:  Larey Seat, M D  Referring Provider: Colon Branch, MD Primary Care Physician:  Kathlene November, MD  Chief Complaint  Patient presents with  . New Patient (Initial Visit)    "sleeps too much" referred by Dr. Larose Kells, rm 48, with mom and baby    HPI:  Brandi Hall is a 25 y.o. female , seen here as a referral from Dr. Larose Kells for hypersomnia with prolonged sleep time , sleep evaluation.   Chief complaint according to patient : " I can't function unless sleeping 12 hours a day"  Brandi Hall is seen here today with her mother and her 62-monthold daughter Brandi Hall She reports that she feels extremely fatigued and excessively daytime sleepy unless she sleeps 10-14 hours at night. The onset of hypersomnia was noted during her college years but she was not excessively daytime sleepy but she attended high school. She remembers that when presentations were made over a slight projector or in a dark room she would have to stand up and sometimes pinched herself to stay awake. This was about 6 years ago and has progressed ever since. The pregnancy was medically uncomplicated as was the delivery of a healthy baby at 459 weeks Her infant daughter sleeps 10 hours at night, she is breastfeeding. During pregnancy she was just as sleepy as before, but she had first time onset nightmares, sleep paralysis, dream intrusion. She woke terrified some nights. Her dreams are usually containing threatening scenes, she has to run away she has to defend herself and a very graphic and vivid. She will get angry and/ or is startled her knees will buckle. She has fallen as a result of one  spell. 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 She also added that  her grand fathers both loud snores, The maternal grandfather was diagnosed with sleep apnea and narcolepsy at age 26  Sleep habits are as follows: 10.30 PM is her usual bed time and she sleeps until 9 AM. She wakes up a lot during the night, sometimes to check on the baby. The baby , Brandi Hall,co-sleeps or sleeps in a bassinette next to the bed.  Her husband is aware of these wakefulness periods, she is not !  The patient wakes usually spontaneously at about 9 AM without an alarm, but within an hour or 2 she feels ready for another nap. Times a day, as she rest feet, she has fallen asleep when she has a comfortable position. She fights all day the irresistible urge to go to sleep.  Sleep medical history and family sleep history:  Both grandfathers have apnea, maternal grandfather hypersomnia, narcolepsy .   Social history:  married, a dTourist information centre manager, first child born July 1 , 2016   Review of Systems: Out of a complete 14 system review, the patient complains of only the following symptoms, and all other reviewed systems are negative.  How likely are you to doze in the following situations: 0 = not likely, 1 = slight chance, 2 = moderate chance, 3 = high chance  Sitting and Reading? 2 Watching Television?2 Sitting inactive in a public place (theater or meeting)?2 Lying down in the afternoon when circumstances permit?3 Sitting and talking  to someone?3 Sitting quietly after lunch without alcohol? 1 In a car, while stopped for a few minutes in traffic? 3 As a passenger in a car for an hour without a break? 0  Total = 16   Fatigue severity score 52  , depression score 0    Social History   Social History  . Marital Status: Married    Spouse Name: N/A  . Number of Children: 1  . Years of Education: N/A   Occupational History  . finished college, works, Pharmacist, hospital    Social History Main Topics  . Smoking status: Never Smoker   . Smokeless tobacco: Never Used  . Alcohol Use: 0.0  oz/week    0 Standard drinks or equivalent per week     Comment: rarely  . Drug Use: No  . Sexual Activity: Not on file   Other Topics Concern  . Not on file   Social History Narrative        Family History  Problem Relation Age of Onset  . Breast cancer Mother     dx age late 10s  . Uterine cancer Maternal Aunt   . Colon cancer Other     Colon  . Diabetes Other     GGF  . Coronary artery disease    . Congestive Heart Failure      Past Medical History  Diagnosis Date  . Lymph node abscess     hosp for infected lymph node (Salmonella) at 25 y/o  . Asthma   . Allergic rhinitis   . Anxiety   . Post term pregnancy, 41 weeks 12/18/2014  . Postpartum care following vaginal delivery (7/1) 12/18/2014    Past Surgical History  Procedure Laterality Date  . Lad bx      Current Outpatient Prescriptions  Medication Sig Dispense Refill  . albuterol (VENTOLIN HFA) 108 (90 BASE) MCG/ACT inhaler Inhale 2 puffs into the lungs every 6 (six) hours as needed for wheezing. 1 Inhaler 1  . cetirizine (ZYRTEC) 10 MG tablet Take 10 mg by mouth daily.    . Fluticasone-Salmeterol (ADVAIR DISKUS) 100-50 MCG/DOSE AEPB Inhale 1 puff into the lungs 2 (two) times daily. 60 each 3  . ibuprofen (ADVIL,MOTRIN) 600 MG tablet Take 1 tablet (600 mg total) by mouth every 6 (six) hours. 30 tablet 0  . loratadine (CLARITIN) 10 MG tablet Take 10 mg by mouth daily.    . Norethindrone (CAMILA PO) Take by mouth.    . Prenatal Vit-Fe Fumarate-FA (PRENATAL MULTIVITAMIN) TABS tablet Take 1 tablet by mouth daily at 12 noon.     No current facility-administered medications for this visit.    Allergies as of 07/28/2015  . (No Known Allergies)    Vitals: BP 118/66 mmHg  Pulse 86  Resp 20  Ht 5' 5.75" (1.67 m)  Wt 146 lb (66.225 kg)  BMI 23.75 kg/m2 Last Weight:  Wt Readings from Last 1 Encounters:  07/28/15 146 lb (66.225 kg)   SWH:QPRF mass index is 23.75 kg/(m^2).     Last Height:   Ht Readings from  Last 1 Encounters:  07/28/15 5' 5.75" (1.67 m)    Physical exam:  General: The patient is awake, alert and appears not in acute distress. The patient is well groomed. Head: Normocephalic, atraumatic. Neck is supple. Mallampati 2  neck circumference:13. Nasal airflow unrestricted, TMJ is not evident . Retrognathia is seen.  Cardiovascular:  Regular rate and rhythm, without  murmurs or carotid bruit, and without distended neck veins.  Respiratory: Lungs are clear to auscultation. Skin:  Without evidence of edema, or rash Trunk: erect posture Neurologic exam : The patient is awake and alert, oriented to place and time.   Memory subjective  described as intact.  Attention span & concentration ability appears normal.  Speech is fluent,  without dysarthria, dysphonia or aphasia.  Mood and affect are appropriate.  Cranial nerves: Pupils are equal and briskly reactive to light. Funduscopic exam without  evidence of pallor or edema.  Extraocular movements  in vertical and horizontal planes intact and without nystagmus. Visual fields by finger perimetry are intact. Hearing to finger rub intact.  Facial sensation intact to fine touch. Facial motor strength is symmetric and tongue and uvula move midline. Shoulder shrug was symmetrical.   Motor exam:  Normal tone, muscle bulk and symmetric strength in all extremities.  Sensory:  Fine touch, pinprick and vibration were tested in all extremities. Proprioception tested in the upper extremities was normal.  Coordination: Rapid alternating movements in the fingers/hands was normal. Finger-to-nose maneuver  normal without evidence of ataxia, dysmetria or tremor.  Gait and station: Patient walks without assistive device and is able unassisted to climb up to the exam table. Strength within normal limits.  Stance is stable and normal.  Turns with 3  Steps. Deep tendon reflexes: in the  upper and lower extremities are symmetric and intact. Babinski   downgoing.  The patient was advised of the nature of the diagnosed sleep disorder , the treatment options and risks for general a health and wellness arising from not treating the condition.  I spent more than 45  minutes of face to face time with the patient.  Greater than 50% of time was spent in counseling and coordination of care. We have discussed the diagnosis and differential and I answered the patient's questions.     Assessment:  After physical and neurologic examination, review of laboratory studies,  Personal review of imaging studies, reports of other /same  Imaging studies ,  Results of polysomnography/ neurophysiology testing and pre-existing records as far as provided in visit., my assessment is   1)  NARCOLEPSY:  Excessive daytime sleepiness with cataplexy, vivid dreams, sleep paralysis.  Brandi Hall is not treated with any REM suppressant medications, and therefor able to have a PSG with MSLT any day.   2)  Snoring reported, but has not other physical risk factors for sleep apnea.    3)  pregnancy / breastfeeding will not influence the testing of narcolepsy, but the treatment option.   discussed Adderall, Ritalin, Modafinil, and XYREM.   Plan:  Treatment plan and additional workup :  HLA narcolepsy , PSG and MSLT to follow .  Rv after she underwent testing, medication will have to wait until after she completed 12 month of breastfeeding. All medications would have to be discontinued if she is pregnant again .      Asencion Partridge Abdullah Rizzi MD  07/28/2015   CC: Colon Branch, [place], [place] [address]

## 2015-08-09 ENCOUNTER — Telehealth: Payer: Self-pay

## 2015-08-09 NOTE — Telephone Encounter (Signed)
Spoke to pt and advised her that her HLA test for narcolepsy was negative. Pt was asking about when she should come for her sleep studies? I advised her that I would have our sleep lab call her. Pt verbalized understanding.

## 2016-06-02 DIAGNOSIS — F321 Major depressive disorder, single episode, moderate: Secondary | ICD-10-CM | POA: Diagnosis not present

## 2016-06-02 DIAGNOSIS — Z6825 Body mass index (BMI) 25.0-25.9, adult: Secondary | ICD-10-CM | POA: Diagnosis not present

## 2016-06-02 DIAGNOSIS — Z01419 Encounter for gynecological examination (general) (routine) without abnormal findings: Secondary | ICD-10-CM | POA: Diagnosis not present

## 2016-06-05 DIAGNOSIS — F411 Generalized anxiety disorder: Secondary | ICD-10-CM | POA: Diagnosis not present

## 2016-07-17 ENCOUNTER — Ambulatory Visit (INDEPENDENT_AMBULATORY_CARE_PROVIDER_SITE_OTHER): Payer: BLUE CROSS/BLUE SHIELD | Admitting: Internal Medicine

## 2016-07-17 ENCOUNTER — Encounter: Payer: Self-pay | Admitting: Internal Medicine

## 2016-07-17 VITALS — BP 118/78 | HR 82 | Temp 98.2°F | Resp 14 | Ht 66.0 in | Wt 148.0 lb

## 2016-07-17 DIAGNOSIS — Z09 Encounter for follow-up examination after completed treatment for conditions other than malignant neoplasm: Secondary | ICD-10-CM | POA: Insufficient documentation

## 2016-07-17 DIAGNOSIS — Z0000 Encounter for general adult medical examination without abnormal findings: Secondary | ICD-10-CM

## 2016-07-17 LAB — LIPID PANEL
CHOL/HDL RATIO: 3
CHOLESTEROL: 143 mg/dL (ref 0–200)
HDL: 56.8 mg/dL (ref >39.00)
LDL Cholesterol: 76 mg/dL (ref 0–99)
NonHDL: 86.47
TRIGLYCERIDES: 53 mg/dL (ref 0.0–149.0)
VLDL: 10.6 mg/dL (ref 0.0–40.0)

## 2016-07-17 MED ORDER — ONDANSETRON HCL 4 MG PO TABS: 4.0000 mg | ORAL_TABLET | Freq: Three times a day (TID) | ORAL | 0 refills | Status: DC | PRN

## 2016-07-17 NOTE — Assessment & Plan Note (Addendum)
Here for CPX. Still breast-feeding. Anxiety: On Zoloft rx  by gynecology Asthma: Occasional wheezing, once or twice a month. Rec to take albuterol as needed only Acute gastroenteritis: Conservative treatment, bland diet, Zofran as needed. Call if no better RTC one year.

## 2016-07-17 NOTE — Progress Notes (Signed)
Pre visit review using our clinic review tool, if applicable. No additional management support is needed unless otherwise documented below in the visit note. 

## 2016-07-17 NOTE — Patient Instructions (Signed)
GO TO THE LAB : Get the blood work     GO TO THE FRONT DESK Schedule your next appointment for a  physical exam one year   Stomach virus: Rest Drink plenty of clear fluids Follow-up bland diet Okay to take Zofran as needed for nausea Call if not gradually improving   For asthma: Albuterol as needed only  Once  You are  better, come for a hepatitis B shot  #3

## 2016-07-17 NOTE — Assessment & Plan Note (Addendum)
Td 20013;   we discussed flu shot but she is ill today. Needs Hep B #3 once she is better Hold Menactra and Prevnar until she stops breast-feeding. Female care per gynecology Labs: FLP,  Diet and exercise discussed

## 2016-07-17 NOTE — Progress Notes (Signed)
Subjective:    Patient ID: Glennon HamiltonJulianna E Sole, female    DOB: 03/01/1991, 26 y.o.   MRN: [id number]  DOS:  07/17/2016 Type of visit - description : CPX Interval history: Doing well with her lifestyle    Review of Systems Constitutional: No fever. No chills. No unexplained wt changes. No unusual sweats  HEENT: No dental problems, no ear discharge, no facial swelling, no voice changes. No eye discharge, no eye  redness , no  intolerance to light   Respiratory: No wheezing , no  difficulty breathing. No cough , no mucus production  Cardiovascular: No CP, no leg swelling , no  Palpitations  GI:  2 days ago developed lower fever 1, then nausea, vomiting, watery diarrhea and lower abdominal cramps. No blood in the stools. Overall symptoms have decreased today. Her daughter is developing the same symptoms.      Endocrine: No polyphagia, no polyuria , no polydipsia  GU: No dysuria, gross hematuria, difficulty urinating. No urinary urgency, no frequency.  Musculoskeletal: No joint swellings or unusual aches or pains  Skin: No change in the color of the skin, palor , no  Rash  Allergic, immunologic: No environmental allergies , no  food allergies  Neurological: No dizziness no  syncope. Had a episode of moderate to severe headache yesterday, resolved today. No diplopia, no slurred, no slurred speech, no motor deficits, no facial  Numbness  Hematological: No enlarged lymph nodes, no easy bruising , no unusual bleedings  Psychiatry: No suicidal ideas, no hallucinations, no beavior problems, no confusion.  No unusual/severe anxiety, no depression   Past Medical History:  Diagnosis Date  . Allergic rhinitis   . Anxiety   . Asthma   . Lymph node abscess    hosp for infected lymph node (Salmonella) at 26 y/o  . Post term pregnancy, 41 weeks 12/18/2014  . Postpartum care following vaginal delivery (7/1) 12/18/2014    Past Surgical History:  Procedure Laterality Date  . LAD bx       Social History   Social History  . Marital status: Married    Spouse name: N/A  . Number of children: 1  . Years of education: N/A   Occupational History  . finished college, works-- Tourist information centre managerdance teacher    Social History Main Topics  . Smoking status: Never Smoker  . Smokeless tobacco: Never Used  . Alcohol use 0.0 oz/week     Comment: rarely  . Drug use: No  . Sexual activity: Not on file   Other Topics Concern  . Not on file   Social History Narrative   1 child -- 2016      Family History  Problem Relation Age of Onset  . Breast cancer Mother     dx age late 4730s  . Uterine cancer Maternal Aunt   . Colon cancer Other     Colon  . Diabetes Other     GGF  . Coronary artery disease    . Congestive Heart Failure       Allergies as of 07/17/2016   No Known Allergies     Medication List       Accurate as of 07/17/16  2:51 PM. Always use your most recent med list.          albuterol 108 (90 Base) MCG/ACT inhaler Commonly known as:  VENTOLIN HFA Inhale 2 puffs into the lungs every 6 (six) hours as needed for wheezing.   CAMILA 0.35 MG tablet Generic drug:  norethindrone Take 1 tablet by mouth daily.   ondansetron 4 MG tablet Commonly known as:  ZOFRAN Take 1 tablet (4 mg total) by mouth every 8 (eight) hours as needed for nausea or vomiting.   sertraline 50 MG tablet Commonly known as:  ZOLOFT Take 50 mg by mouth daily.          Objective:   Physical Exam BP 118/78 (BP Location: Left Arm, Patient Position: Supine, Cuff Size: Small)   Pulse 82   Temp 98.2 F (36.8 C) (Oral)   Resp 14   Ht 5\' 6"  (1.676 m)   Wt 148 lb (67.1 kg)   SpO2 97%   Breastfeeding? Yes   BMI 23.89 kg/m   General:   Well developed, well nourished, looks tired but not acutely ill.  Neck: No  thyromegaly  HEENT:  Normocephalic . Face symmetric, atraumatic. TMs normal. Throat symmetric, no red, membranes slightly dry  Lungs:  CTA B Normal respiratory effort, no  intercostal retractions, no accessory muscle use. Heart: RRR,  no murmur.  No pretibial edema bilaterally  Abdomen:  Not distended, soft, mildly tender, diffusely, slightly increased bowel sounds, no mass, rebound or rigidity Skin: Exposed areas without rash. Not pale. Not jaundice Neurologic:  alert & oriented X3.  Speech normal, gait appropriate for age and unassisted Strength symmetric and appropriate for age.  Psych: Cognition and judgment appear intact.  Cooperative with normal attention span and concentration.  Behavior appropriate. No anxious or depressed appearing.    Assessment & Plan:   Assessment Anxiety Asthma H/o lymph node abscesses d/t Salmonella at age 50 Gluten intolerant, on a gluten free diet   PLAN: Here for CPX Still breast-feeding. Anxiety: On Zoloft rx  by gynecology Asthma: Occasional wheezing, once or twice a month. Rec to take albuterol as needed only Acute gastroenteritis: Conservative treatment, bland diet, Zofran as needed. Call if no better RTC one year.

## 2016-08-29 DIAGNOSIS — F321 Major depressive disorder, single episode, moderate: Secondary | ICD-10-CM | POA: Diagnosis not present

## 2016-09-25 DIAGNOSIS — F332 Major depressive disorder, recurrent severe without psychotic features: Secondary | ICD-10-CM | POA: Diagnosis not present

## 2016-10-06 DIAGNOSIS — N938 Other specified abnormal uterine and vaginal bleeding: Secondary | ICD-10-CM | POA: Diagnosis not present

## 2016-10-06 DIAGNOSIS — Z3200 Encounter for pregnancy test, result unknown: Secondary | ICD-10-CM | POA: Diagnosis not present

## 2017-02-27 DIAGNOSIS — F332 Major depressive disorder, recurrent severe without psychotic features: Secondary | ICD-10-CM | POA: Diagnosis not present

## 2017-03-27 DIAGNOSIS — F332 Major depressive disorder, recurrent severe without psychotic features: Secondary | ICD-10-CM | POA: Diagnosis not present

## 2017-04-16 ENCOUNTER — Telehealth: Payer: Self-pay | Admitting: Internal Medicine

## 2017-04-16 MED ORDER — ALBUTEROL SULFATE HFA 108 (90 BASE) MCG/ACT IN AERS: 2.0000 | INHALATION_SPRAY | Freq: Four times a day (QID) | RESPIRATORY_TRACT | 1 refills | Status: DC | PRN

## 2017-04-16 NOTE — Telephone Encounter (Signed)
Patient requesting refill on albuterol (VENTOLIN HFA) 108 (90 BASE) MCG/ACT inhaler Please advise.

## 2017-04-16 NOTE — Telephone Encounter (Signed)
Rx sent to [facility] pharmacy.  

## 2017-04-24 DIAGNOSIS — F331 Major depressive disorder, recurrent, moderate: Secondary | ICD-10-CM | POA: Diagnosis not present

## 2017-06-26 DIAGNOSIS — F332 Major depressive disorder, recurrent severe without psychotic features: Secondary | ICD-10-CM | POA: Diagnosis not present

## 2017-07-06 ENCOUNTER — Telehealth: Payer: Self-pay | Admitting: Internal Medicine

## 2017-07-06 NOTE — Telephone Encounter (Signed)
Pt would like to switch providers from Dr. Drue NovelPaz to Dr. Patsy Lageropland. Pt says that she would feel more comfortable with a female provider than a female.    Please advise.

## 2017-07-06 NOTE — Telephone Encounter (Signed)
Please advise 

## 2017-07-06 NOTE — Telephone Encounter (Signed)
Yes, thx

## 2017-07-07 NOTE — Telephone Encounter (Signed)
Ok, but she will need to be put in a new pt spot with me when available

## 2017-07-09 ENCOUNTER — Telehealth: Payer: Self-pay | Admitting: Family Medicine

## 2017-07-09 NOTE — Telephone Encounter (Signed)
Left message with pt to call to schedule a new pt visit with Dr. Patsy Lageropland.

## 2017-07-09 NOTE — Telephone Encounter (Signed)
Okay to schedule NP appt at Pt's convenience.  

## 2017-07-12 NOTE — Telephone Encounter (Signed)
Reason for CRM: Patient has decided to continue care with  Dr Drue NovelPaz ,if this is not ok please contact her ,she is also scheduled for a cpe with  him on 07/25/17

## 2017-07-19 ENCOUNTER — Encounter: Payer: BLUE CROSS/BLUE SHIELD | Admitting: Internal Medicine

## 2017-07-24 DIAGNOSIS — F332 Major depressive disorder, recurrent severe without psychotic features: Secondary | ICD-10-CM | POA: Diagnosis not present

## 2017-07-25 ENCOUNTER — Ambulatory Visit (INDEPENDENT_AMBULATORY_CARE_PROVIDER_SITE_OTHER): Payer: BLUE CROSS/BLUE SHIELD | Admitting: Internal Medicine

## 2017-07-25 ENCOUNTER — Encounter: Payer: Self-pay | Admitting: Internal Medicine

## 2017-07-25 VITALS — BP 116/68 | HR 68 | Temp 98.1°F | Resp 14 | Ht 66.0 in | Wt 164.5 lb

## 2017-07-25 DIAGNOSIS — Z0000 Encounter for general adult medical examination without abnormal findings: Secondary | ICD-10-CM | POA: Diagnosis not present

## 2017-07-25 NOTE — Patient Instructions (Addendum)
GO TO THE LAB : Get the blood work     GO TO THE FRONT DESK Schedule your next appointment   for a physical exam in 1 year 

## 2017-07-25 NOTE — Progress Notes (Signed)
Pre visit review using our clinic review tool, if applicable. No additional management support is needed unless otherwise documented below in the visit note. 

## 2017-07-25 NOTE — Progress Notes (Signed)
Subjective:    Patient ID: Brandi HamiltonJulianna E Hall, female    DOB: 08/23/1990, 27 y.o.   MRN: [id number]  DOS:  07/25/2017 Type of visit - description : cpx Interval history: Here for a physical exam.  We also discussed other issues.    Review of Systems Has eczema, symptoms at baseline Chief complaint of unable to lose weight despite trying to exercise and eat healthy. Has a lump L leg x years , increase in size lately? Other than above, a 14 point review of systems is negative     Past Medical History:  Diagnosis Date  . Allergic rhinitis   . Anxiety   . Asthma   . Lymph node abscess    hosp for infected lymph node (Salmonella) at [age] y/o  . Post term pregnancy, 41 weeks 12/18/2014  . Postpartum care following vaginal delivery (7/1) 12/18/2014    Past Surgical History:  Procedure Laterality Date  . LAD bx      Social History   Socioeconomic History  . Marital status: Married    Spouse name: Not on file  . Number of children: 1  . Years of education: Not on file  . Highest education level: Not on file  Social Needs  . Financial resource strain: Not on file  . Food insecurity - worry: Not on file  . Food insecurity - inability: Not on file  . Transportation needs - medical: Not on file  . Transportation needs - non-medical: Not on file  Occupational History  . Occupation: finished college, works-- Tourist information centre managerdance teacher  Tobacco Use  . Smoking status: Never Smoker  . Smokeless tobacco: Never Used  Substance and Sexual Activity  . Alcohol use: Yes    Alcohol/week: 0.0 oz    Comment: rarely  . Drug use: No  . Sexual activity: Not on file  Other Topics Concern  . Not on file  Social History Narrative   1 daughter -- 2016      Family History  Problem Relation Age of Onset  . Breast cancer Mother        dx age late 6430s  . Uterine cancer Maternal Aunt   . Colon cancer Other        Colon  . Diabetes Other        GGF  . Coronary artery disease Unknown   . Congestive  Heart Failure Unknown      Allergies as of 07/25/2017   No Known Allergies     Medication List        Accurate as of 07/25/17 11:59 PM. Always use your most recent med list.          albuterol 108 (90 Base) MCG/ACT inhaler Commonly known as:  VENTOLIN HFA Inhale 2 puffs into the lungs every 6 (six) hours as needed for wheezing or shortness of breath.   CAMILA 0.35 MG tablet Generic drug:  norethindrone Take 1 tablet by mouth daily.   ondansetron 4 MG tablet Commonly known as:  ZOFRAN Take 1 tablet (4 mg total) by mouth every 8 (eight) hours as needed for nausea or vomiting.   sertraline 50 MG tablet Commonly known as:  ZOLOFT Take 100 mg by mouth daily.          Objective:   Physical Exam  Skin:      BP 116/68 (BP Location: Left Arm, Patient Position: Sitting, Cuff Size: Small)   Pulse 68   Temp 98.1 F (36.7 C) (Oral)   Resp  14   Ht 5\' 6"  (1.676 m)   Wt 164 lb 8 oz (74.6 kg)   SpO2 97%   Breastfeeding? No   BMI 26.55 kg/m  General:   Well developed, well nourished . NAD.  Neck: No  thyromegaly  HEENT:  Normocephalic . Face symmetric, atraumatic Lungs:  CTA B Normal respiratory effort, no intercostal retractions, no accessory muscle use. Heart: RRR,  no murmur.  No pretibial edema bilaterally  Abdomen:  Not distended, soft, non-tender. No rebound or rigidity.   Skin: Exposed areas without rash. Not pale. Not jaundice Neurologic:  alert & oriented X3.  Speech normal, gait appropriate for age and unassisted Strength symmetric and appropriate for age.  Psych: Cognition and judgment appear intact.  Cooperative with normal attention span and concentration.  Behavior appropriate. No anxious or depressed appearing.     Assessment & Plan:    Assessment Anxiety Asthma H/o lymph node abscesses d/t Salmonella at age 74 Gluten intolerant, on a gluten free diet   PLAN: Here for CPX Anxiety: Under the care of psychiatry, recently she was  prescribed bupropion, did not worked for her, she is switching back to sertraline.  Thinking about change psychiatrist, info  about other providers in town provided  Asthma: Currently on albuterol only, hardly ever has sxsymptoms SQ mass, left leg, likely a sebaceous cyst, increasing in size lately?  Recommend observation, if she feels it continue increasing, refer to surgery Somnolence: Seen by neurology 2017, suspected to have narcolepsy.  Rec to d/w them further treatment. RTC 1 year

## 2017-07-25 NOTE — Assessment & Plan Note (Addendum)
-  Td 2013;  pnm 23: 2015; flu shot recommended, declined  -Female care per gynecology -Labs: Last cholesterol very good, check a CMP, CBC, TSH -Diet and exercise: Reports he is unable to lose weight, extensive discussion about diet, exercise, information ref  the bariatric clinics provided although she may not qualify for it due to her BMI

## 2017-07-26 LAB — COMPREHENSIVE METABOLIC PANEL
ALT: 14 U/L (ref 0–35)
AST: 16 U/L (ref 0–37)
Albumin: 4.4 g/dL (ref 3.5–5.2)
Alkaline Phosphatase: 54 U/L (ref 39–117)
BUN: 13 mg/dL (ref 6–23)
CHLORIDE: 105 meq/L (ref 96–112)
CO2: 29 mEq/L (ref 19–32)
Calcium: 9.4 mg/dL (ref 8.4–10.5)
Creatinine, Ser: 0.78 mg/dL (ref 0.40–1.20)
GFR: 93.91 mL/min (ref >60.00)
GLUCOSE: 88 mg/dL (ref 70–99)
POTASSIUM: 4.2 meq/L (ref 3.5–5.1)
SODIUM: 141 meq/L (ref 135–145)
Total Bilirubin: 0.4 mg/dL (ref 0.2–1.2)
Total Protein: 7.1 g/dL (ref 6.0–8.3)

## 2017-07-26 LAB — CBC WITH DIFFERENTIAL/PLATELET
BASOS PCT: 0.8 % (ref 0.0–3.0)
Basophils Absolute: 0.1 10*3/uL (ref 0.0–0.1)
Eosinophils Absolute: 0.7 10*3/uL (ref 0.0–0.7)
Eosinophils Relative: 9.4 % — ABNORMAL HIGH (ref 0.0–5.0)
HCT: 41.6 % (ref 36.0–46.0)
HEMOGLOBIN: 14.1 g/dL (ref 12.0–15.0)
LYMPHS ABS: 2.7 10*3/uL (ref 0.7–4.0)
Lymphocytes Relative: 36.6 % (ref 12.0–46.0)
MCHC: 33.9 g/dL (ref 30.0–36.0)
MCV: 88.5 fl (ref 78.0–100.0)
Monocytes Absolute: 0.3 10*3/uL (ref 0.1–1.0)
Monocytes Relative: 4.3 % (ref 3.0–12.0)
Neutro Abs: 3.6 10*3/uL (ref 1.4–7.7)
Neutrophils Relative %: 48.9 % (ref 43.0–77.0)
Platelets: 219 10*3/uL (ref 150.0–400.0)
RBC: 4.7 Mil/uL (ref 3.87–5.11)
RDW: 12.9 % (ref 11.5–15.5)
WBC: 7.3 10*3/uL (ref 4.0–10.5)

## 2017-07-26 LAB — TSH: TSH: 2.4 u[IU]/mL (ref 0.35–4.50)

## 2017-07-26 NOTE — Assessment & Plan Note (Signed)
Here for CPX Anxiety: Under the care of psychiatry, recently she was prescribed bupropion, did not worked for her, she is switching back to sertraline.  Thinking about change psychiatrist, info  about other providers in town provided  Asthma: Currently on albuterol only, hardly ever has sxsymptoms SQ mass, left leg, likely a sebaceous cyst, increasing in size lately?  Recommend observation, if she feels it continue increasing, refer to surgery Somnolence: Seen by neurology 2017, suspected to have narcolepsy.  Rec to d/w them further treatment. RTC 1 year

## 2017-08-21 DIAGNOSIS — F332 Major depressive disorder, recurrent severe without psychotic features: Secondary | ICD-10-CM | POA: Diagnosis not present

## 2017-09-18 DIAGNOSIS — F332 Major depressive disorder, recurrent severe without psychotic features: Secondary | ICD-10-CM | POA: Diagnosis not present

## 2017-10-29 DIAGNOSIS — Z3201 Encounter for pregnancy test, result positive: Secondary | ICD-10-CM | POA: Diagnosis not present

## 2017-11-05 DIAGNOSIS — Z3201 Encounter for pregnancy test, result positive: Secondary | ICD-10-CM | POA: Diagnosis not present

## 2017-11-05 DIAGNOSIS — Z3490 Encounter for supervision of normal pregnancy, unspecified, unspecified trimester: Secondary | ICD-10-CM | POA: Diagnosis not present

## 2017-11-20 ENCOUNTER — Ambulatory Visit: Payer: BLUE CROSS/BLUE SHIELD | Admitting: Internal Medicine

## 2017-11-20 ENCOUNTER — Encounter: Payer: Self-pay | Admitting: Internal Medicine

## 2017-11-20 VITALS — BP 122/78 | HR 80 | Temp 97.9°F | Resp 16 | Ht 66.0 in | Wt 164.4 lb

## 2017-11-20 DIAGNOSIS — J069 Acute upper respiratory infection, unspecified: Secondary | ICD-10-CM

## 2017-11-20 DIAGNOSIS — Z20818 Contact with and (suspected) exposure to other bacterial communicable diseases: Secondary | ICD-10-CM

## 2017-11-20 LAB — POCT RAPID STREP A (OFFICE): RAPID STREP A SCREEN: NEGATIVE

## 2017-11-20 NOTE — Progress Notes (Signed)
Pre visit review using our clinic review tool, if applicable. No additional management support is needed unless otherwise documented below in the visit note. 

## 2017-11-20 NOTE — Progress Notes (Signed)
Subjective:    Patient ID: Brandi Hall, female    DOB: 05/30/1991, 27 y.o.   MRN: [id number]  DOS:  11/20/2017 Type of visit - description : Acute Interval history: Symptoms of started yesterday with some cough and sore throat. Her daughter had fever and cough, she went to the pediatrician and tested positive for strep. Pt  is pregnant, ongoing nausea for the last 3 to 4 weeks   Review of Systems  No fever or chills Mild sputum production.  Sinuses are congested, blowing some white to yellowish discharge. Has been using albuterol more frequently in the last couple of days   Past Medical History:  Diagnosis Date  . Allergic rhinitis   . Anxiety   . Asthma   . Lymph node abscess    hosp for infected lymph node (Salmonella) at 27 y/o  . Post term pregnancy, 41 weeks 12/18/2014  . Postpartum care following vaginal delivery (7/1) 12/18/2014    Past Surgical History:  Procedure Laterality Date  . LAD bx      Social History   Socioeconomic History  . Marital status: Married    Spouse name: Not on file  . Number of children: 1  . Years of education: Not on file  . Highest education level: Not on file  Occupational History  . Occupation: finished college, works-- Tourist information centre managerdance teacher  Social Needs  . Financial resource strain: Not on file  . Food insecurity:    Worry: Not on file    Inability: Not on file  . Transportation needs:    Medical: Not on file    Non-medical: Not on file  Tobacco Use  . Smoking status: Never Smoker  . Smokeless tobacco: Never Used  Substance and Sexual Activity  . Alcohol use: Yes    Alcohol/week: 0.0 oz    Comment: rarely  . Drug use: No  . Sexual activity: Not on file  Lifestyle  . Physical activity:    Days per week: Not on file    Minutes per session: Not on file  . Stress: Not on file  Relationships  . Social connections:    Talks on phone: Not on file    Gets together: Not on file    Attends religious service: Not on file   Active member of club or organization: Not on file    Attends meetings of clubs or organizations: Not on file    Relationship status: Not on file  . Intimate partner violence:    Fear of current or ex partner: Not on file    Emotionally abused: Not on file    Physically abused: Not on file    Forced sexual activity: Not on file  Other Topics Concern  . Not on file  Social History Narrative   1 daughter -- 2016       Allergies as of 11/20/2017   No Known Allergies     Medication List        Accurate as of 11/20/17 11:12 AM. Always use your most recent med list.          albuterol 108 (90 Base) MCG/ACT inhaler Commonly known as:  VENTOLIN HFA Inhale 2 puffs into the lungs every 6 (six) hours as needed for wheezing or shortness of breath.   buPROPion 100 MG 12 hr tablet Commonly known as:  WELLBUTRIN SR Take 100 mg by mouth daily.   prenatal multivitamin Tabs tablet Take 1 tablet by mouth daily at 12 noon.  ranitidine 75 MG tablet Commonly known as:  ZANTAC Take 75 mg by mouth at bedtime.          Objective:   Physical Exam BP 122/78 (BP Location: Right Arm, Patient Position: Sitting, Cuff Size: Small)   Pulse 80   Temp 97.9 F (36.6 C) (Oral)   Resp 16   Ht 5\' 6"  (1.676 m)   Wt 164 lb 6 oz (74.6 kg)   SpO2 97%   BMI 26.53 kg/m  General:   Well developed, well nourished . NAD.  HEENT:  Normocephalic . Face symmetric, atraumatic Nose congested, sinuses no TTP.  TMs normal. Throat not red, symmetric. Lungs:  CTA B Normal respiratory effort, no intercostal retractions, no accessory muscle use. Heart: RRR,  no murmur.  No pretibial edema bilaterally  Skin: Not pale. Not jaundice Neurologic:  alert & oriented X3.  Speech normal, gait appropriate for age and unassisted Psych--  Cognition and judgment appear intact.  Cooperative with normal attention span and concentration.  Behavior appropriate. No anxious or depressed appearing.      Assessment &  Plan:   Assessment Anxiety Asthma H/o lymph node abscesses d/t Salmonella at age 27 Gluten intolerant, on a gluten free diet   PLAN: URI: Rapid strep test negative, will check a culture otherwise recommend supportive treatment with fluids, Delsym, pseudoephedrine, Claritin.  Call if not better Patient is pregnant, having nausea, recommend to discuss with gynecology.

## 2017-11-20 NOTE — Patient Instructions (Signed)
Rest, fluids   For cough:  Take DELSYM OTC   For nasal congestion: Get pseudoephedrine 30 mg (behind the counter, you need to talk with the pharmacist) take one tablet 3 or 4 times a day as needed for congestion   Call if not gradually better over the next  10 days  Call anytime if the symptoms are severe

## 2017-11-21 NOTE — Assessment & Plan Note (Signed)
URI: Rapid strep test negative, will check a culture otherwise recommend supportive treatment with fluids, Delsym, pseudoephedrine, Claritin.  Call if not better Patient is pregnant, having nausea, recommend to discuss with gynecology.

## 2017-11-22 LAB — CULTURE, GROUP A STREP
MICRO NUMBER:: 90670541
SPECIMEN QUALITY:: ADEQUATE

## 2017-12-03 DIAGNOSIS — Z01419 Encounter for gynecological examination (general) (routine) without abnormal findings: Secondary | ICD-10-CM | POA: Diagnosis not present

## 2017-12-03 DIAGNOSIS — Z3689 Encounter for other specified antenatal screening: Secondary | ICD-10-CM | POA: Diagnosis not present

## 2017-12-03 DIAGNOSIS — Z118 Encounter for screening for other infectious and parasitic diseases: Secondary | ICD-10-CM | POA: Diagnosis not present

## 2017-12-03 DIAGNOSIS — Z124 Encounter for screening for malignant neoplasm of cervix: Secondary | ICD-10-CM | POA: Diagnosis not present

## 2017-12-03 DIAGNOSIS — Z3481 Encounter for supervision of other normal pregnancy, first trimester: Secondary | ICD-10-CM | POA: Diagnosis not present

## 2017-12-10 ENCOUNTER — Ambulatory Visit: Payer: BLUE CROSS/BLUE SHIELD | Admitting: Family Medicine

## 2017-12-10 ENCOUNTER — Encounter: Payer: Self-pay | Admitting: Family Medicine

## 2017-12-10 VITALS — BP 132/76 | HR 78 | Temp 98.4°F | Ht 66.0 in | Wt 166.0 lb

## 2017-12-10 DIAGNOSIS — J3489 Other specified disorders of nose and nasal sinuses: Secondary | ICD-10-CM | POA: Diagnosis not present
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
## 2017-12-10 NOTE — Patient Instructions (Signed)
Nice to meet you  Please take tylenol for pain and ice  Please follow up with me if your pain gets worse  They will call you to set up the appointment.

## 2017-12-10 NOTE — Assessment & Plan Note (Signed)
Swelling isolated to the bridge and can breath through each nare. No septal hematoma and is straight.  - counseled on pain control  - referreal to ENT  - given indications to follow up. If no improvement consider imaging however she is [redacted] weeks pregnant.

## 2018-02-28 DIAGNOSIS — Z363 Encounter for antenatal screening for malformations: Secondary | ICD-10-CM | POA: Diagnosis not present

## 2018-04-02 DIAGNOSIS — Z113 Encounter for screening for infections with a predominantly sexual mode of transmission: Secondary | ICD-10-CM | POA: Diagnosis not present

## 2018-04-02 DIAGNOSIS — Z3689 Encounter for other specified antenatal screening: Secondary | ICD-10-CM | POA: Diagnosis not present

## 2018-04-02 DIAGNOSIS — Z130 Encounter for screening for diseases of the blood and blood-forming organs and certain disorders involving the immune mechanism: Secondary | ICD-10-CM | POA: Diagnosis not present

## 2018-04-02 DIAGNOSIS — Z131 Encounter for screening for diabetes mellitus: Secondary | ICD-10-CM | POA: Diagnosis not present

## 2018-05-05 ENCOUNTER — Other Ambulatory Visit: Payer: Self-pay | Admitting: Internal Medicine

## 2018-05-28 DIAGNOSIS — Z8659 Personal history of other mental and behavioral disorders: Secondary | ICD-10-CM | POA: Diagnosis not present

## 2018-05-28 DIAGNOSIS — Z3483 Encounter for supervision of other normal pregnancy, third trimester: Secondary | ICD-10-CM | POA: Diagnosis not present

## 2018-05-28 DIAGNOSIS — Z3685 Encounter for antenatal screening for Streptococcus B: Secondary | ICD-10-CM | POA: Diagnosis not present

## 2018-06-04 ENCOUNTER — Ambulatory Visit: Payer: BLUE CROSS/BLUE SHIELD | Admitting: Internal Medicine

## 2018-06-04 ENCOUNTER — Encounter: Payer: Self-pay | Admitting: Internal Medicine

## 2018-06-04 VITALS — BP 124/72 | HR 119 | Temp 98.6°F | Resp 16 | Ht 66.0 in | Wt 186.4 lb

## 2018-06-04 DIAGNOSIS — B349 Viral infection, unspecified: Secondary | ICD-10-CM | POA: Diagnosis not present

## 2018-06-04 DIAGNOSIS — J069 Acute upper respiratory infection, unspecified: Secondary | ICD-10-CM

## 2018-06-04 LAB — POCT INFLUENZA A/B
INFLUENZA B, POC: NEGATIVE
Influenza A, POC: NEGATIVE

## 2018-06-04 MED ORDER — AZITHROMYCIN 250 MG PO TABS: ORAL_TABLET | ORAL | 0 refills | Status: DC

## 2018-06-04 NOTE — Patient Instructions (Signed)
Rest, fluids , tylenol  For cough:  Take Mucinex DM twice a day as needed until better  Albuterol 4 times a day as needed   For nasal congestion: Use OTC Nasocort  e : 2 nasal sprays on each side of the nose in the morning until you feel better  Get pseudoephedrine 30 mg (behind the counter, you need to talk with the pharmacist) take one tablet 3 or 4 times a day as needed for congestion  Take the antibiotic as prescribed , zithromax only if no better in few days   Call if not gradually better over the next  10 days  Call anytime if the symptoms are severe

## 2018-06-04 NOTE — Progress Notes (Signed)
Subjective:    Patient ID: Brandi Hall, female    DOB: 05/25/1991, 27 y.o.   MRN: [id number]  DOS:  06/04/2018 Type of visit - description : Acute visit  Symptoms started yesterday with dry cough, increased wheezing from baseline. She is 9 months pregnant and doing well. She also developed nausea she thinks is simply related to pregnancy.  Denies any abdominal pain, no diarrhea. Taking few OTCs that are okay with her pregnancy  Review of Systems Denies fever chills No sore throat No rash Some chest congestion. She had a mild headache yesterday, none today. No myalgias  Past Medical History:  Diagnosis Date  . Allergic rhinitis   . Anxiety   . Asthma   . Lymph node abscess    hosp for infected lymph node (Salmonella) at 27 y/o  . Post term pregnancy, 41 weeks 12/18/2014  . Postpartum care following vaginal delivery (7/1) 12/18/2014    Past Surgical History:  Procedure Laterality Date  . LAD bx      Social History   Socioeconomic History  . Marital status: Married    Spouse name: Not on file  . Number of children: 1  . Years of education: Not on file  . Highest education level: Not on file  Occupational History  . Occupation: finished college, works-- Tourist information centre managerdance teacher  Social Needs  . Financial resource strain: Not on file  . Food insecurity:    Worry: Not on file    Inability: Not on file  . Transportation needs:    Medical: Not on file    Non-medical: Not on file  Tobacco Use  . Smoking status: Never Smoker  . Smokeless tobacco: Never Used  Substance and Sexual Activity  . Alcohol use: Yes    Alcohol/week: 0.0 standard drinks    Comment: rarely  . Drug use: No  . Sexual activity: Not on file  Lifestyle  . Physical activity:    Days per week: Not on file    Minutes per session: Not on file  . Stress: Not on file  Relationships  . Social connections:    Talks on phone: Not on file    Gets together: Not on file    Attends religious service: Not on  file    Active member of club or organization: Not on file    Attends meetings of clubs or organizations: Not on file    Relationship status: Not on file  . Intimate partner violence:    Fear of current or ex partner: Not on file    Emotionally abused: Not on file    Physically abused: Not on file    Forced sexual activity: Not on file  Other Topics Concern  . Not on file  Social History Narrative   1 daughter -- 2016       Allergies as of 06/04/2018   No Known Allergies     Medication List       Accurate as of June 04, 2018 11:59 PM. Always use your most recent med list.        albuterol 108 (90 Base) MCG/ACT inhaler Commonly known as:  PROVENTIL HFA;VENTOLIN HFA Inhale 2 puffs into the lungs every 6 (six) hours as needed for wheezing or shortness of breath.   azithromycin 250 MG tablet Commonly known as:  ZITHROMAX Z-PAK 2 tabs a day the first day, then 1 tab a day x 4 days   loratadine 10 MG tablet Commonly known as:  CLARITIN  Take 10 mg by mouth daily.   prenatal multivitamin Tabs tablet Take 1 tablet by mouth daily at 12 noon.         Objective:   Physical Exam BP 124/72 (BP Location: Left Arm, Patient Position: Sitting, Cuff Size: Small)   Pulse (!) 119   Temp 98.6 F (37 C) (Oral)   Resp 16   Ht 5\' 6"  (1.676 m)   Wt 186 lb 6 oz (84.5 kg)   SpO2 98%   BMI 30.08 kg/m  General:   Well developed, pregnant female, NAD, BMI noted. HEENT:  Normocephalic . Face symmetric, atraumatic. TMs: Slightly bulged but not red.  Nose quite congested.  Sinuses no TTP.  Throat symmetric, no red. Lungs:  Few rhonchi, slightly increased expiratory time Normal respiratory effort, no intercostal retractions, no accessory muscle use. Heart: RRR,  no murmur.  No pretibial edema bilaterally  Skin: Not pale. Not jaundice Neurologic:  alert & oriented X3.  Speech normal, gait appropriate for age and unassisted Psych--  Cognition and judgment appear intact.    Cooperative with normal attention span and concentration.  Behavior appropriate. No anxious or depressed appearing.      Assessment & Plan:    Assessment Anxiety Asthma H/o lymph node abscesses d/t Salmonella at age 77 Gluten intolerant, on a gluten free diet   PLAN: URI: Patient is 9 months pregnant, symptoms of started yesterday, no classic symptoms of influenza, flu test negative, suspect is a URI/viral syndrome. She has asthma and has been taking her inhaler frequently, no severe wheezing at the time of exam. Plan:  Supportive treatment, she has a very detailed list of OTCs that she can take and I encouraged her to use them. Drink plenty of fluids. If she is not gradually better, recommend a Z-Pak. Continue with albuterol, consider a low-dose of p.o.  prednisone or Pulmicort (it is pregnancy safe) Call or go to the ER if she feels worse. Her  obstetrician is Arlan Organ at the [facility].

## 2018-06-04 NOTE — Progress Notes (Signed)
Pre visit review using our clinic review tool, if applicable. No additional management support is needed unless otherwise documented below in the visit note. 

## 2018-06-05 NOTE — Assessment & Plan Note (Signed)
URI: Patient is 9 months pregnant, symptoms of started yesterday, no classic symptoms of influenza, flu test negative, suspect is a URI/viral syndrome. She has asthma and has been taking her inhaler frequently, no severe wheezing at the time of exam. Plan:  Supportive treatment, she has a very detailed list of OTCs that she can take and I encouraged her to use them. Drink plenty of fluids. If she is not gradually better, recommend a Z-Pak. Continue with albuterol, consider a low-dose of p.o.  prednisone or Pulmicort (it is pregnancy safe) Call or go to the ER if she feels worse. Her  obstetrician is Arlan OrganDaniela Paul at the [facility].

## 2018-06-25 ENCOUNTER — Inpatient Hospital Stay (HOSPITAL_COMMUNITY)
Admission: AD | Admit: 2018-06-25 | Payer: BLUE CROSS/BLUE SHIELD | Source: Home / Self Care | Admitting: Obstetrics and Gynecology

## 2018-06-26 DIAGNOSIS — Z3A40 40 weeks gestation of pregnancy: Secondary | ICD-10-CM | POA: Diagnosis not present

## 2018-12-10 ENCOUNTER — Ambulatory Visit (INDEPENDENT_AMBULATORY_CARE_PROVIDER_SITE_OTHER)
Admission: RE | Admit: 2018-12-10 | Discharge: 2018-12-10 | Disposition: A | Discharge status: Home / Self Care | Payer: BC Managed Care – PPO | Source: Ambulatory Visit

## 2018-12-10 DIAGNOSIS — N390 Urinary tract infection, site not specified: Secondary | ICD-10-CM | POA: Diagnosis not present

## 2018-12-10 DIAGNOSIS — N3001 Acute cystitis with hematuria: Secondary | ICD-10-CM

## 2018-12-10 MED ORDER — CEPHALEXIN 500 MG PO CAPS: 500.0000 mg | ORAL_CAPSULE | Freq: Two times a day (BID) | ORAL | 0 refills | Status: AC

## 2018-12-10 NOTE — ED Provider Notes (Signed)
Virtual Visit via Video Note:  Brandi Hall  initiated request for Telemedicine visit with [facility] team. I connected with Brandi Hall  on 12/10/2018 at 10:11 AM  for a synchronized telemedicine visit using a video enabled HIPPA compliant telemedicine application. I verified that I am speaking with Brandi Hall  using two identifiers. [name], NP  was physically located in a [facility] Urgent care site and [name] was located at a different location.   The limitations of evaluation and management by telemedicine as well as the availability of in-person appointments were discussed. Patient was informed that she  may incur a bill ( including co-pay) for this virtual visit encounter. Brandi Hall  expressed understanding and gave verbal consent to proceed with virtual visit.     History of Present Illness:Brandi Hall  is a 28 y.o. female presents with approximately 2 days of worsening hematuria, dysuria, urinary frequency and bladder discomfort.  Reporting history of the same proximally 5 years ago when she had urinary tract infection.  Recent pregnancy approximately 5 months ago and still breast-feeding.  Mild right flank pain but denies any fevers, nausea or vomiting.  Denies any vaginal discharge, itching or irritation.  Denies any concern for STDs.  Past Medical History:  Diagnosis Date  . Allergic rhinitis   . Anxiety   . Asthma   . Lymph node abscess    hosp for infected lymph node (Salmonella) at 28 y/o  . Post term pregnancy, 41 weeks 12/18/2014  . Postpartum care following vaginal delivery (7/1) 12/18/2014    No Known Allergies      Observations/Objective:GENERAL APPEARANCE: Well developed, well nourished, alert and cooperative, and appears to be in no acute distress. HEAD: normocephalic. Non labored breathing, no dyspnea or distress Skin: Skin normal color  PSYCHIATRIC: The mental examination revealed the patient was oriented to  person, place, and time. The patient was able to demonstrate good judgement and reason, without hallucinations, abnormal affect or abnormal behaviors during the examination. Patient is not suicidal     Assessment and Plan: Symptoms consistent with urinary tract infection.  We will go ahead and treat with Keflex twice a day for 7 days Push fluids    Follow Up Instructions: Instructed patient to follow-up in person for continued or worsening symptoms.    I discussed the assessment and treatment plan with the patient. The patient was provided an opportunity to ask questions and all were answered. The patient agreed with the plan and demonstrated an understanding of the instructions.   The patient was advised to call back or seek an in-person evaluation if the symptoms worsen or if the condition fails to improve as anticipated.     [name], NP  12/10/2018 10:11 AM         [name], NP 12/10/18 [id number]

## 2018-12-10 NOTE — Discharge Instructions (Signed)
Treating you for a urinary tract infection Make sure you are drinking plenty of fluids and staying hydrated Take the medication as prescribed.  If your symptoms worsen or do not improve despite treatment you need to follow-up in person.

## 2019-02-19 DIAGNOSIS — H5213 Myopia, bilateral: Secondary | ICD-10-CM | POA: Diagnosis not present

## 2019-02-19 DIAGNOSIS — H52223 Regular astigmatism, bilateral: Secondary | ICD-10-CM | POA: Diagnosis not present

## 2019-04-01 DIAGNOSIS — Z30430 Encounter for insertion of intrauterine contraceptive device: Secondary | ICD-10-CM | POA: Diagnosis not present

## 2019-04-01 DIAGNOSIS — Z3202 Encounter for pregnancy test, result negative: Secondary | ICD-10-CM | POA: Diagnosis not present

## 2019-04-29 DIAGNOSIS — Z30431 Encounter for routine checking of intrauterine contraceptive device: Secondary | ICD-10-CM | POA: Diagnosis not present

## 2019-04-29 DIAGNOSIS — R102 Pelvic and perineal pain: Secondary | ICD-10-CM | POA: Diagnosis not present

## 2019-07-03 DIAGNOSIS — Z3009 Encounter for other general counseling and advice on contraception: Secondary | ICD-10-CM | POA: Diagnosis not present

## 2019-07-03 DIAGNOSIS — N760 Acute vaginitis: Secondary | ICD-10-CM | POA: Diagnosis not present

## 2019-07-03 DIAGNOSIS — T8332XA Displacement of intrauterine contraceptive device, initial encounter: Secondary | ICD-10-CM | POA: Diagnosis not present

## 2019-07-03 DIAGNOSIS — Z30432 Encounter for removal of intrauterine contraceptive device: Secondary | ICD-10-CM | POA: Diagnosis not present

## 2019-07-03 DIAGNOSIS — Z3200 Encounter for pregnancy test, result unknown: Secondary | ICD-10-CM | POA: Diagnosis not present

## 2019-07-03 DIAGNOSIS — R300 Dysuria: Secondary | ICD-10-CM | POA: Diagnosis not present

## 2019-08-04 DIAGNOSIS — U071 COVID-19: Secondary | ICD-10-CM | POA: Diagnosis not present

## 2019-08-04 DIAGNOSIS — J45909 Unspecified asthma, uncomplicated: Secondary | ICD-10-CM | POA: Diagnosis not present

## 2019-12-24 DIAGNOSIS — R300 Dysuria: Secondary | ICD-10-CM | POA: Diagnosis not present

## 2020-02-02 ENCOUNTER — Other Ambulatory Visit: Payer: Self-pay | Admitting: Internal Medicine

## 2020-02-04 ENCOUNTER — Ambulatory Visit (INDEPENDENT_AMBULATORY_CARE_PROVIDER_SITE_OTHER): Payer: BC Managed Care – PPO | Admitting: Internal Medicine

## 2020-02-04 ENCOUNTER — Encounter: Payer: Self-pay | Admitting: Internal Medicine

## 2020-02-04 ENCOUNTER — Other Ambulatory Visit: Payer: Self-pay

## 2020-02-04 VITALS — BP 139/82 | HR 88 | Temp 98.0°F | Resp 18 | Ht 66.0 in | Wt 169.0 lb

## 2020-02-04 DIAGNOSIS — J4520 Mild intermittent asthma, uncomplicated: Secondary | ICD-10-CM

## 2020-02-04 MED ORDER — ALBUTEROL SULFATE HFA 108 (90 BASE) MCG/ACT IN AERS: 2.0000 | INHALATION_SPRAY | Freq: Four times a day (QID) | RESPIRATORY_TRACT | 5 refills | Status: DC | PRN

## 2020-02-04 NOTE — Patient Instructions (Signed)
    GO TO THE FRONT DESK, PLEASE SCHEDULE YOUR APPOINTMENTS Come back for   a physical in 1 year  

## 2020-02-04 NOTE — Progress Notes (Signed)
Pre visit review using our clinic review tool, if applicable. No additional management support is needed unless otherwise documented below in the visit note. 

## 2020-02-04 NOTE — Progress Notes (Signed)
Subjective:    Patient ID: Brandi Hall, female    DOB: [birth year], 29 y.o.   MRN: [id number]  DOS:  02/04/2020 Type of visit - description: Follow-up Request a refill on albuterol. Reports that she has asthma episodes on and off characterized by cough, wheezing.  In average needs her albuterol ~ 2 to 3 times a week. She feels that allergies is the trigger for asthma and typically takes Claritin or other OTC with good results.    Review of Systems No fever chills No sneezing, sinus pain or congestion. Occasionally she feels eye irritation due to allergies. Occasionally has acid reflux type of symptoms No sputum production  Past Medical History:  Diagnosis Date  . Allergic rhinitis   . Anxiety   . Asthma   . Lymph node abscess    hosp for infected lymph node (Salmonella) at 29 y/o  . Post term pregnancy, 41 weeks 12/18/2014  . Postpartum care following vaginal delivery (7/1) 12/18/2014    Past Surgical History:  Procedure Laterality Date  . LAD bx      Allergies as of 02/04/2020   No Known Allergies     Medication List       Accurate as of February 04, 2020  2:22 PM. If you have any questions, ask your nurse or doctor.        albuterol 108 (90 Base) MCG/ACT inhaler Commonly known as: VENTOLIN HFA Inhale 2 puffs into the lungs every 6 (six) hours as needed for wheezing or shortness of breath.   azithromycin 250 MG tablet Commonly known as: Zithromax Z-Pak 2 tabs a day the first day, then 1 tab a day x 4 days   loratadine 10 MG tablet Commonly known as: CLARITIN Take 10 mg by mouth daily.   prenatal multivitamin Tabs tablet Take 1 tablet by mouth daily at 12 noon.          Objective:   Physical Exam BP 139/82 (BP Location: Left Arm, Patient Position: Sitting, Cuff Size: Small)   Pulse 88   Temp 98 F (36.7 C) (Oral)   Resp 18   Ht 5\' 6"  (1.676 m)   Wt 169 lb (76.7 kg)   LMP 12/29/2019 (Approximate)   SpO2 96%   Breastfeeding Yes   BMI 27.28  kg/m  General:   Well developed, NAD, BMI noted. HEENT:  Normocephalic . Face symmetric, atraumatic Lungs:  CTA B Normal respiratory effort, no intercostal retractions, no accessory muscle use. Heart: RRR,  no murmur.  Lower extremities: no pretibial edema bilaterally  Skin: Not pale. Not jaundice Neurologic:  alert & oriented X3.  Speech normal, gait appropriate for age and unassisted Psych--  Cognition and judgment appear intact.  Cooperative with normal attention span and concentration.  Behavior appropriate. No anxious or depressed appearing.      Assessment     Assessment Anxiety Asthma, allergies H/o lymph node abscesses d/t Salmonella at age 31 Gluten intolerant, on a gluten free diet   PLAN: Asthma, allergies: Symptoms are currently well controlled with a OTC antihistaminic albuterol which she uses on average 2-3 times a week. Advised patient that if she is needing increasing amounts of albuterol let me know. She inquired about the allergist referral but at this point symptoms seem to be well controlled.   Has occasional GERD symptoms, encouraged to call me if they become more persistent.    This visit occurred during the SARS-CoV-2 public health emergency.  Safety protocols were in place, including  screening questions prior to the visit, additional usage of staff PPE, and extensive cleaning of exam room while observing appropriate contact time as indicated for disinfecting solutions.

## 2020-02-05 NOTE — Assessment & Plan Note (Signed)
Asthma, allergies: Symptoms are currently well controlled with a OTC antihistaminic albuterol which she uses on average 2-3 times a week. Advised patient that if she is needing increasing amounts of albuterol let me know. She inquired about the allergist referral but at this point symptoms seem to be well controlled.   Has occasional GERD symptoms, encouraged to call me if they become more persistent.

## 2020-05-19 ENCOUNTER — Encounter: Payer: Self-pay | Admitting: Internal Medicine

## 2020-05-19 ENCOUNTER — Ambulatory Visit (HOSPITAL_BASED_OUTPATIENT_CLINIC_OR_DEPARTMENT_OTHER)
Admission: RE | Admit: 2020-05-19 | Discharge: 2020-05-19 | Disposition: A | Discharge status: Home / Self Care | Payer: BC Managed Care – PPO | Source: Ambulatory Visit | Attending: Internal Medicine | Admitting: Internal Medicine

## 2020-05-19 ENCOUNTER — Other Ambulatory Visit: Payer: Self-pay

## 2020-05-19 ENCOUNTER — Ambulatory Visit (INDEPENDENT_AMBULATORY_CARE_PROVIDER_SITE_OTHER): Payer: BC Managed Care – PPO | Admitting: Internal Medicine

## 2020-05-19 VITALS — BP 123/73 | HR 86 | Temp 98.0°F | Ht 66.0 in | Wt 171.0 lb

## 2020-05-19 DIAGNOSIS — M2550 Pain in unspecified joint: Secondary | ICD-10-CM | POA: Diagnosis not present

## 2020-05-19 DIAGNOSIS — M722 Plantar fascial fibromatosis: Secondary | ICD-10-CM | POA: Diagnosis not present

## 2020-05-19 DIAGNOSIS — M79672 Pain in left foot: Secondary | ICD-10-CM | POA: Insufficient documentation

## 2020-05-19 DIAGNOSIS — Z0389 Encounter for observation for other suspected diseases and conditions ruled out: Secondary | ICD-10-CM | POA: Diagnosis not present

## 2020-05-19 DIAGNOSIS — G47411 Narcolepsy with cataplexy: Secondary | ICD-10-CM

## 2020-05-19 DIAGNOSIS — G47419 Narcolepsy without cataplexy: Secondary | ICD-10-CM | POA: Insufficient documentation

## 2020-05-19 LAB — CBC WITH DIFFERENTIAL/PLATELET
Basophils Absolute: 0.1 10*3/uL (ref 0.0–0.1)
Basophils Relative: 1.2 % (ref 0.0–3.0)
Eosinophils Absolute: 0.4 10*3/uL (ref 0.0–0.7)
Eosinophils Relative: 6.8 % — ABNORMAL HIGH (ref 0.0–5.0)
HCT: 41.3 % (ref 36.0–46.0)
Hemoglobin: 14 g/dL (ref 12.0–15.0)
Lymphocytes Relative: 33.1 % (ref 12.0–46.0)
Lymphs Abs: 1.9 10*3/uL (ref 0.7–4.0)
MCHC: 33.9 g/dL (ref 30.0–36.0)
MCV: 89.3 fl (ref 78.0–100.0)
Monocytes Absolute: 0.3 10*3/uL (ref 0.1–1.0)
Monocytes Relative: 4.9 % (ref 3.0–12.0)
Neutro Abs: 3.1 10*3/uL (ref 1.4–7.7)
Neutrophils Relative %: 54 % (ref 43.0–77.0)
Platelets: 219 10*3/uL (ref 150.0–400.0)
RBC: 4.63 Mil/uL (ref 3.87–5.11)
RDW: 12.4 % (ref 11.5–15.5)
WBC: 5.7 10*3/uL (ref 4.0–10.5)

## 2020-05-19 LAB — SEDIMENTATION RATE: Sed Rate: 3 mm/hr (ref 0–20)

## 2020-05-19 NOTE — Patient Instructions (Signed)
   GO TO THE LAB : Get the blood work      STOP BY THE FIRST FLOOR:  get the XR     

## 2020-05-19 NOTE — Progress Notes (Signed)
Subjective:    Patient ID: Brandi Hall, female    DOB: 1991/06/07, 29 y.o.   MRN: [id number]  DOS:  05/19/2020 Type of visit - description: Acute Multiple MSK issues: She was diagnosed with COVID 08/01/2019, since then has noted more stiffness particularly at the ankles and in general  more aches and pains than usual. 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 I asked her specifically about wrist or hand joints puffiness/redness/swelling: Denies.  She also has some specific pains: Left second toe at the base is severe pain on and off. She suspect she has right sided plantar fasciitis because she has pain near the heel.  Review of Systems I asked specifically about fever chills or rash: Denies. Also, I noted a history of possible narcolepsy, continue with lack of energy, forgetfulness and falling asleep easily. Still breast-feeding.   Past Medical History:  Diagnosis Date  . Allergic rhinitis   . Anxiety   . Asthma   . Lymph node abscess    hosp for infected lymph node (Salmonella) at 29 y/o  . Post term pregnancy, 41 weeks 12/18/2014  . Postpartum care following vaginal delivery (7/1) 12/18/2014    Past Surgical History:  Procedure Laterality Date  . LAD bx      Allergies as of 05/19/2020   No Known Allergies     Medication List       Accurate as of May 19, 2020  9:44 AM. If you have any questions, ask your nurse or doctor.        albuterol 108 (90 Base) MCG/ACT inhaler Commonly known as: VENTOLIN HFA Inhale 2 puffs into the lungs every 6 (six) hours as needed for wheezing or shortness of breath.   loratadine 10 MG tablet Commonly known as: CLARITIN Take 10 mg by mouth daily.   Magnesium 100 MG Caps Take by mouth.   prenatal multivitamin Tabs tablet Take 1 tablet by mouth daily at 12 noon.   PROBIOTIC ACIDOPHILUS PO Take by mouth.          Objective:   Physical Exam BP 123/73 (BP Location: Right Arm, Patient Position: Sitting, Cuff  Size: Large)   Pulse 86   Temp 98 F (36.7 C) (Oral)   Ht 5\' 6"  (1.676 m)   Wt 171 lb (77.6 kg)   SpO2 95%   BMI 27.60 kg/m  General:   Well developed, NAD, BMI noted. HEENT:  Normocephalic . Face symmetric, atraumatic MSK: Hands and wrists with no synovitis or deformities. Knees: No effusion, range of motion normal.  I did feel a mild crackling with range of motion. Feet: Symmetric, no edema, warm and well-perfused. Slightly TTP at the right plantar area near the heel. Slightly TTP at the base of the second left toe. Lower extremities: no pretibial edema bilaterally  Skin: Not pale. Not jaundice Neurologic:  alert & oriented X3.  Speech normal, gait appropriate for age and unassisted Psych--  Cognition and judgment appear intact.  Cooperative with normal attention span and concentration.  Behavior appropriate. No anxious or depressed appearing.      Assessment     Assessment Anxiety Asthma, allergies H/o lymph node abscesses d/t Salmonella at age 71 Gluten intolerant, on a gluten free diet  Covid dx 08/01/2019, dx @ the UC , treated at home, mild   PLAN: Arthralgias, mild stiffness: Since she was dx w/ Covid few months ago she is experiencing more ill-defined arthralgias in general and some stiffness at the  ankles specifically. Exam is benign.  For now recommend basic blood work including CBC, sed rate, ANA and rheumatoid factor. Left foot pain: DDx includes Morton's neuroma, metatarsalgia, a subacute fracture.  Get x-ray, if symptoms persist will rec  sports medicine. Right foot pain: Likely plantar fasciitis, recommend stretching. Narcolepsy: Saw Dr. Vickey Huger 2017, work-up was incomplete, still has symptoms, refer to neurology    This visit occurred during the SARS-CoV-2 public health emergency.  Safety protocols were in place, including screening questions prior to the visit, additional usage of staff PPE, and extensive cleaning of exam room while observing  appropriate contact time as indicated for disinfecting solutions.

## 2020-05-20 LAB — RHEUMATOID FACTOR: Rheumatoid fact SerPl-aCnc: <14 IU/mL (ref <14)

## 2020-05-20 LAB — ANA: Anti Nuclear Antibody (ANA): NEGATIVE

## 2020-05-21 NOTE — Assessment & Plan Note (Signed)
Arthralgias, mild stiffness: Since she was dx w/ Covid few months ago she is experiencing more ill-defined arthralgias in general and some stiffness at the ankles specifically. Exam is benign.  For now recommend basic blood work including CBC, sed rate, ANA and rheumatoid factor. Left foot pain: DDx includes Morton's neuroma, metatarsalgia, a subacute fracture.  Get x-ray, if symptoms persist will rec  sports medicine. Right foot pain: Likely plantar fasciitis, recommend stretching. Narcolepsy: Saw Dr. Vickey Huger 2017, work-up was incomplete, still has symptoms, refer to neurology

## 2020-06-30 ENCOUNTER — Ambulatory Visit (INDEPENDENT_AMBULATORY_CARE_PROVIDER_SITE_OTHER): Payer: BC Managed Care – PPO | Admitting: Neurology

## 2020-06-30 ENCOUNTER — Other Ambulatory Visit: Payer: Self-pay

## 2020-06-30 ENCOUNTER — Encounter: Payer: Self-pay | Admitting: Neurology

## 2020-06-30 VITALS — BP 121/80 | HR 80 | Ht 66.0 in | Wt 174.0 lb

## 2020-06-30 DIAGNOSIS — G47411 Narcolepsy with cataplexy: Secondary | ICD-10-CM | POA: Diagnosis not present

## 2020-06-30 NOTE — Patient Instructions (Signed)
I believe you have a condition called narcolepsy: This means, that you have a sleep disorder that manifests with at times severe excessive sleepiness during the day and often with problems with sleep at night. We may have to try different medications that may help you stay awake during the day. Not everything works with everybody the same way. Wake promoting agents include stimulants and non-stimulant type medications. The most common side effects with stimulants are weight loss, insomnia, nervousness, headaches, palpitations, rise in blood pressure, anxiety. Stimulants can be addictive and subject to abuse. Non-stimulant type wake promoting medications include Provigil and Nuvigil, most common side effects include headaches, nervousness, insomnia, hypertension. In addition there is a medication called Xyrem which has been proven to be very effective in patients with narcolepsy with or without cataplexy. Some patients with narcolepsy report episodes of weakness, such as jaw or facial weakness, legs giving out, feeling wobbly or like "Jell-o", etc. in situations of anxiety, stress, laughter, sudden sadness, surprise, etc., which is called cataplexy. You can also experience episodes of sleep paralysis during which you may feel unable to move upon awakening. Some people experience dreamlike sequences upon awakening or upon drifting off to sleep, called hypnopompic or hypnagogic hallucinations.     Narcolepsy Narcolepsy is a neurological disorder that causes people to fall asleep suddenly and without control (have sleep attacks) during the daytime. It is a lifelong disorder. Narcolepsy disrupts the sleep cycle at night, which then causes daytime sleepiness. What are the causes? The cause of narcolepsy is not fully understood, but it may be related to:  Low levels of hypocretin, a chemical (neurotransmitter) in the brain that controls sleep and wake cycles. Hypocretin imbalance may be caused by: ? Abnormal  genes that are passed from parent to child (inherited). ? An autoimmune disease in which the body's defense system (immune system) attacks the brain cells that make hypocretin.  Infection, tumor, or injury in the area of the brain that controls sleep.  Exposure to poisons (toxins), such as heavy metals, pesticides, and secondhand smoke. What are the signs or symptoms? Symptoms of this condition include:  Excessive daytime sleepiness. This is the most common symptom and is usually the first symptom you will notice. This may affect your performance at work or school.  Sleep attacks. You may fall asleep in the middle of an activity, especially low-energy activities like reading or watching TV.  Feeling like you cannot think clearly and trouble focusing or remembering things. You may also feel depressed.  Sudden muscle weakness (cataplexy). When this occurs, your speech may become slurred, or your knees may buckle. Cataplexy is usually triggered by surprise, anger, fear, or laughter.  Losing the ability to speak or move (sleep paralysis). This may occur just as you start to fall asleep or wake up. You will be aware of the paralysis. It usually lasts for just a few seconds or minutes.  Seeing, hearing, tasting, smelling, or feeling things that are not real (hallucinations). Hallucinations may occur with sleep paralysis. They can happen when you are falling asleep, waking up, or dozing.  Trouble staying asleep at night (insomnia) and restless sleep. How is this diagnosed? This condition may be diagnosed based on:  A physical exam to rule out any other problems that may be causing your symptoms. You may be asked to write down your sleeping patterns for several weeks in a sleep diary. This will help your health care provider make a diagnosis.  Sleep studies that measure how well  your REM sleep is regulated. These tests also measure your heart rate, breathing, movement, and brain waves. These tests  include: ? An overnight sleep study (polysomnogram). ? A daytime sleep study that is done while you take several naps during the day (multiple sleep latency test, MSLT). This test measures how quickly you fall asleep and how quickly you enter REM sleep.  Removal of spinal fluid to measure hypocretin levels. How is this treated? There is no cure for this condition, but treatment can help relieve symptoms. Treatment may include:  Lifestyle and sleeping strategies to help you cope with the condition, such as: ? Exercising regularly. ? Maintaining a regular sleep schedule. ? Avoiding caffeine and large meals before bed.  Medicines. These may include: ? Medicines that help keep you awake and alert (stimulants) to fight daytime sleepiness. ? Medicines that treat depression (antidepressants). These may be used to treat cataplexy. ? Sodium oxybate. This is a strong medicine to help you relax (sedative) that you may take at night. It can help control daytime sleepiness and cataplexy.  Other treatments may include mental health counseling or joining a support group. Follow these instructions at home: Sleeping habits  Get about 8 hours of sleep every night.  Go to sleep and get up at about the same time every day.  Keep your bedroom dark, quiet, and comfortable.  When you feel very tired, take short naps. Schedule naps so that you take them at about the same time every day.  Before bedtime: ? Avoid bright lights and screens. ? Relax. Try activities like reading or taking a warm bath.   Activity  Get at least 20 minutes of exercise every day. This will help you sleep better at night and reduce daytime sleepiness.  Avoid exercising within 3 hours of bedtime.  Do not drive or use heavy machinery if you are sleepy. If possible, take a nap before driving.  Do not swim or go out on the water without a life jacket. Eating and drinking  Do not drink alcohol or caffeinated beverages within  4-5 hours of bedtime.  Do not eat a large meal before bedtime.  Eat meals at about the same times every day. General instructions  Take over-the-counter and prescription medicines only as told by your health care provider.  Keep a sleep diary as told by your health care provider.  Tell your employer or teachers that you have narcolepsy. You may be able to adjust your schedule to include time for naps.  Do not use any products that contain nicotine or tobacco, such as cigarettes, e-cigarettes, and chewing tobacco. If you need help quitting, ask your health care provider.  Keep all follow-up visits as told by your health care provider. This is important.   Where to find more information  [employer] of Neurological Disorders: [employer] Contact a health care provider if:  Your symptoms are not getting better.  You have increasingly high blood pressure (hypertension).  You have changes in your heart rhythm.  You are having a hard time determining what is real and what is not (psychosis). Get help right away if you:  Hurt yourself during a sleep attack or an attack of cataplexy.  Have chest pain.  Have trouble breathing. These symptoms may represent a serious problem that is an emergency. Do not wait to see if the symptoms will go away. Get medical help right away. Call your local emergency services (911 in the [place] Do not drive yourself to the hospital.  Summary  Narcolepsy is a neurological disorder that causes people to fall asleep suddenly, and without control, during the daytime (sleep attacks). It is a lifelong disorder.  There is no cure for this condition, but treatment can help relieve symptoms.  Go to sleep and get up at about the same time every day. Follow instructions about sleep and activities as told by your health care provider.  Take over-the-counter and prescription medicines only as told by your health care provider. This information is not  intended to replace advice given to you by your health care provider. Make sure you discuss any questions you have with your health care provider. Document Revised: 01/15/2019 Document Reviewed: 01/15/2019 Elsevier Patient Education  2021 [employer].

## 2020-06-30 NOTE — Progress Notes (Signed)
SLEEP MEDICINE CLINIC    Provider:  Larey Seat, MD  Primary Care Physician:  Colon Branch, [address] [place] [address]     Referring Provider: Colon Branch, Md [address] [place],  [place] [address]          Chief Complaint according to patient   Patient presents with:    . New Patient (Initial Visit)           HISTORY OF PRESENT ILLNESS:  Brandi Hall is a 30 y.o. Caucasian female patient and  seen upon a consultation request / referral on 06/30/2020 from Dr. Larose Kells.   Chief concern according to patient : hypersomnia:    I have the pleasure of seeing Brandi Hall toda 06-30-2020, a right -handed female with a reported history of hypersomnia, onset over 10 years ago. She started drinking coffee at age 59 to combat  Asthma, and sleepiness.  Brandi Hall also reports that she was excessively sleepy when she couldn't drink caffeine and she named her pregnancies as an example.  She endorsed the Epworth sleepiness score at a very high rate of 19 out of 24 possible points, and stated that she is always taking naps and feels that she needs to sleep more.  She always feels tired. She had Covid 19 in February in 2021- and she was sick, headaches, and fatigue.  Her whole family got it. She has been told that she snores she has suffered from anxiety if there is also some joint pain that may contribute to some discomfort overnight and the level of decreased energy in daytime. She takes 1-2 hours naps, not power naps.  She dreams even in naps, has had sleep paralysis, dreams are very vivid , terrifying her. She sleep talks. She is restless, moves frequently in sleep.  She  has a past medical history of Allergic rhinitis, Anxiety,  Allergic Asthma, Lymph node abscess, Post term pregnancy, 41 weeks (12/18/2014), and Postpartum care following vaginal delivery (7/1) (12/18/2014).     Sleep relevant medical history: allergic rhinitis and asthma, hypersomnia,  excessive daytime sleepiness and vivid dreams. Cataplectic attacks, knees buckle. Dream intrusions.     Family medical /sleep history: MGF on CPAP with OSA, mother and sister have hypersomnia.     Social history: Patient is working as a Medical laboratory scientific officer- and lives in a household with husband and 2 daughters, and one cat.  . Tobacco use; none .  ETOH use ; none ,  Caffeine intake in form of Coffee(2 cups in AM and cold brew  2 cups in PM) Soda( /). Regular exercise in form of dancing       Sleep habits are as follows: The patient's dinner time is between 7-8 PM. The patient goes to bed at 9 PM and continues to sleep for 8-9 hours, she does not wakes for  bathroom breaks. The preferred sleep position is in fetal position, with the support of 1-2 pillows. Dreams are reportedly frequent/vivid  8 AM is the usual rise time.The patient wakes up with great difficulty, she sleeps through multiple alarms.  She reports not feeling refreshed or restored in AM, with symptoms such as dry mouth morning headaches, and residual fatigue. Naps are taken frequently, lasting 1-2 hours.     Review of Systems: Out of a complete 14 system review, the patient complains of only the following symptoms, and all other reviewed systems are negative.:  Fatigue, sleepiness , snoring, vivid dreams, sleep paralysis, cataplectic attacks.  How likely are you to doze in the following situations: 0 = not likely, 1 = slight chance, 2 = moderate chance, 3 = high chance   Sitting and Reading? Watching Television? Sitting inactive in a public place (theater or meeting)? As a passenger in a car for an hour without a break? Lying down in the afternoon when circumstances permit? Sitting and talking to someone? Sitting quietly after lunch without alcohol? In a car, while stopped for a few minutes in traffic?   Total = 19/ 24 points   FSS endorsed at 63/ 63 points.     Social History   Socioeconomic History  . Marital status:  Married    Spouse name: Not on file  . Number of children: 1  . Years of education: Not on file  . Highest education level: Not on file  Occupational History  . Occupation: finished college, works-- Medical laboratory scientific officer  Tobacco Use  . Smoking status: Never Smoker  . Smokeless tobacco: Never Used  Substance and Sexual Activity  . Alcohol use: Yes    Alcohol/week: 0.0 standard drinks    Comment: rarely  . Drug use: No  . Sexual activity: Not on file  Other Topics Concern  . Not on file  Social History Narrative   1 daughter -- 2016    Social Determinants of Health   Financial Resource Strain: Not on file  Food Insecurity: Not on file  Transportation Needs: Not on file  Physical Activity: Not on file  Stress: Not on file  Social Connections: Not on file    Family History  Problem Relation Age of Onset  . Breast cancer Mother        dx age late 4s  . Uterine cancer Maternal Aunt   . Colon cancer Other        Colon  . Diabetes Other        GGF  . Coronary artery disease Other   . Congestive Heart Failure Other     Past Medical History:  Diagnosis Date  . Allergic rhinitis   . Anxiety   . Asthma   . Lymph node abscess    hosp for infected lymph node (Salmonella) at 30 y/o  . Post term pregnancy, 41 weeks 12/18/2014  . Postpartum care following vaginal delivery (7/1) 12/18/2014    Past Surgical History:  Procedure Laterality Date  . LAD bx       Current Outpatient Medications on File Prior to Visit  Medication Sig Dispense Refill  . albuterol (VENTOLIN HFA) 108 (90 Base) MCG/ACT inhaler Inhale 2 puffs into the lungs every 6 (six) hours as needed for wheezing or shortness of breath. 18 g 5  . Lactobacillus (PROBIOTIC ACIDOPHILUS PO) Take by mouth.    . loratadine (CLARITIN) 10 MG tablet Take 10 mg by mouth daily.    . Magnesium 100 MG CAPS Take by mouth.    . Prenatal Vit-Fe Fumarate-FA (PRENATAL MULTIVITAMIN) TABS tablet Take 1 tablet by mouth daily at 12 noon.      No current facility-administered medications on file prior to visit.    No Known Allergies  Physical exam:  Today's Vitals   06/30/20 1103  BP: 121/80  Pulse: 80  Weight: 174 lb (78.9 kg)  Height: _0  (1.676 m)   Body mass index is 28.08 kg/m.   Wt Readings from Last 3 Encounters:  06/30/20 174 lb (78.9 kg)  05/19/20 171  lb (77.6 kg)  02/04/20 169 lb (76.7 kg)     Ht Readings from Last 3 Encounters:  06/30/20 _0  (1.676 m)  05/19/20 _1  (1.676 m)  02/04/20 _2  (1.676 m)      General: The patient is awake, alert and appears not in acute distress. The patient is well groomed. Head: Normocephalic, atraumatic. Neck is supple.  Mallampati 2,  neck circumference: 14 inches . Nasal airflow patent only on the left nasion- septal deviation.    Retrognathia is seen. Crowded teeth, wore an expanding brace Dental status: native.  Cardiovascular:  Regular rate and cardiac rhythm by pulse,  without distended neck veins. Respiratory: Lungs are clear to auscultation.  Skin:  Without evidence of ankle edema, or rash. Trunk: The patient's posture is erect.   Neurologic exam : The patient is awake and alert, oriented to place and time.   Memory subjective described as intact.  Attention span & concentration ability appears normal.  Speech is fluent,  without  dysarthria, dysphonia or aphasia.  Mood and affect are appropriate.   Cranial nerves: no loss of smell or taste reported  Pupils are equal and briskly reactive to light. Funduscopic exam  deferred.  Extraocular movements in vertical and horizontal planes were intact and without nystagmus. No Diplopia. Visual fields by finger perimetry are intact. Hearing was intact to soft voice and finger rubbing.    Facial sensation intact to fine touch.  Facial motor strength is symmetric and tongue and uvula move midline.  Neck ROM : rotation, tilt and flexion extension were normal for age and shoulder shrug was symmetrical.     Motor exam:  Symmetric bulk, tone and ROM.   Normal tone without cog wheeling, symmetric grip strength .   Sensory:  Fine touch, pinprick and vibration were tested  and  normal.  Proprioception tested in the upper extremities was normal.   Coordination: Rapid alternating movements in the fingers/hands were of normal speed.  The Finger-to-nose maneuver was intact without evidence of ataxia, dysmetria or tremor.   Gait and station: Patient could rise unassisted from a seated position, walked without assistive device.  Stance is of normal width/ base and the patient turned with 3 steps.  Toe and heel walk were deferred.  Deep tendon reflexes: in the  upper and lower extremities are symmetric and intact.  Babinski response was deferred.        After spending a total time of  45  minutes face to face and additional time for physical and neurologic examination, review of laboratory studies,  personal review of imaging studies, reports and results of other testing and review of referral information / records as far as provided in visit, I have established the following assessments:  1)  Patient with classic history of hypersomnia since childhood. Exacerbation over 10 years ago, used to fall asleep at high school,at  her life guard job.   2) the patient describes cataplexy, dream intrusion visit often threatening bothersome dream content.  She does take naps but not power naps naps last 2 hours easily and she will still not get her batteries recharged to the level where she feels a significant decrease in fatigue or sleepiness.  She has also described sleep paralysis.  3) goal is to differentiate narcolepsy type I from acquired narcolepsy or idiopathic hypersomnia.   My Plan is to proceed with:  1)  PSG followed by MSLT- cannot be on antidepressants, on sedatives or stimulants.   2) I  will order HLA test , narcolepsy    I would like to thank Colon Branch, MD and Colon Branch, [address] [place],  [place] [address] for allowing me to meet with and to take care of this pleasant patient.   In short, Rylan E Steffy is presenting with ataplexy, dream intrusion visit often threatening bothersome dream content.  She does take naps but not power naps naps last 2 hours easily and she will still not get her batteries recharged to the level where she feels a significant decrease in fatigue or sleepiness.  She has also described sleep paralysis.  I plan to follow up either personally or through our NP within 2-3 month.   CC: I will share my notes with PCP.   Electronically signed by: Larey Seat, MD 06/30/2020 11:22 AM  [facility] Board certified by The [employer] of Sleep Medicine and Diplomate of the [employer] of Sleep Medicine. Board certified In Neurology through the [employer], Fellow of the [employer] of Neurology. Medical Director of [facility].

## 2020-07-12 LAB — NARCOLEPSY EVALUATION
DQA1*01:02: NEGATIVE
DQB1*06:02: NEGATIVE

## 2020-07-12 NOTE — Progress Notes (Signed)
2 HLA narcolepsy factors were negative,

## 2020-07-13 ENCOUNTER — Encounter: Payer: Self-pay | Admitting: Neurology

## 2020-08-02 ENCOUNTER — Other Ambulatory Visit: Payer: Self-pay

## 2020-08-02 ENCOUNTER — Ambulatory Visit (INDEPENDENT_AMBULATORY_CARE_PROVIDER_SITE_OTHER): Payer: BC Managed Care – PPO | Admitting: Neurology

## 2020-08-02 DIAGNOSIS — G4719 Other hypersomnia: Secondary | ICD-10-CM

## 2020-08-02 DIAGNOSIS — G47411 Narcolepsy with cataplexy: Secondary | ICD-10-CM

## 2020-08-02 DIAGNOSIS — G4733 Obstructive sleep apnea (adult) (pediatric): Secondary | ICD-10-CM

## 2020-08-10 NOTE — Procedures (Signed)
PATIENT'S NAME:  Brandi, Hall DOB:      1991/02/21      MR#:    [id number]     DATE OF RECORDING: 08/02/2020 REFERRING M.D.:  Willow Ora, MD Study Performed:   Baseline Polysomnogram HISTORY:   Brandi Hall was seen on 06-30-2020, a right -handed female with a reported history of hypersomnia, onset over 10 years ago. She started drinking coffee at age 30 to combat Asthma and sleepiness. She also reports that she was excessively sleepy when she couldn't drink caffeine and she named her pregnancies as an example.  She endorsed the Epworth sleepiness score at a very high rate of 19 out of 24 possible points and stated that she is always taking naps and feels that she needs to sleep more.  She always feels tired. She had Covid 19 in February in 2021- and she was sick, nausea, headaches, and fatigue.  Her whole family got it.  She has been told that she snores she has suffered from anxiety if there is also some joint pain that may contribute to some discomfort overnight and the level of decreased energy in daytime. She takes 1-2 hours naps, not power naps. She dreams even in naps, has had sleep paralysis, dreams are very vivid, terrifying her. She sleep-talks. She is restless, moves frequently in sleep.  She has a past medical history of Allergic rhinitis, Anxiety, Allergic Asthma, Lymph node abscess, Post term pregnancy, 41 weeks (12/18/2014), and Postpartum care following vaginal delivery (7/1) (12/18/2014).   Sleep relevant medical history: allergic rhinitis and asthma, hypersomnia, excessive daytime sleepiness and vivid dreams. Cataplectic attacks, knees buckle. Dream intrusions.    The patient endorsed the Epworth Sleepiness Scale at 19 points.   The patient's weight 174 pounds with a height of 66 (inches), resulting in a BMI of 28. kg/m2. The patient's neck circumference measured 14 inches.  CURRENT MEDICATIONS: Ventolin, Probiotic, Claritin, Magnesium, Multivitamin   PROCEDURE:  This is a  multichannel digital polysomnogram utilizing the Somnostar 11.2 system.  Electrodes and sensors were applied and monitored per AASM Specifications.   EEG, EOG, Chin and Limb EMG, were sampled at 200 Hz.  ECG, Snore and Nasal Pressure, Thermal Airflow, Respiratory Effort, CPAP Flow and Pressure, Oximetry was sampled at 50 Hz. Digital video and audio were recorded.      BASELINE STUDY: Lights Out was at 21:16 and Lights On at 04:57.  Total recording time (TRT) was 461.5 minutes, with a total sleep time (TST) of 337.5 minutes.   The patient's sleep latency was 118.5 minutes.  REM latency was 258 minutes.  The sleep efficiency was 73.1 %.     SLEEP ARCHITECTURE: WASO (Wake after sleep onset) was 115.5 minutes.  There were 16 minutes in Stage N1, 167 minutes Stage N2, 104.5 minutes Stage N3 and 50 minutes in Stage REM.  The percentage of Stage N1 was 4.7%, Stage N2 was 49.5%, Stage N3 was 31.% and Stage R (REM sleep) was 14.8%.    RESPIRATORY ANALYSIS:  There were a total of 109 respiratory events:  0 obstructive apneas, 0 central apneas and 0 mixed apneas with a total of 0 apneas and an apnea index (AI) of 0 /hour. There were 109 hypopneas with a hypopnea index of 19.4 /hour. The patient also had 0 respiratory event related arousals (RERAs).      The total APNEA/HYPOPNEA INDEX (AHI) was 19.4/hour and the total RESPIRATORY DISTURBANCE INDEX was  19.4 /hour.  47 events occurred in REM  sleep and 124 events in NREM. The REM AHI was  56.4 /hour, versus a non-REM AHI of 12.9. The patient spent 238.5 minutes of total sleep time in the supine position and 99 minutes in non-supine.. The supine AHI was 26.7 versus a non-supine AHI of 1.8.  OXYGEN SATURATION & C02:  The Wake baseline 02 saturation was 95%, with the lowest being 88%. Time spent below 89% saturation equaled 0 minutes.  The arousals were noted as: 54 were spontaneous, 0 were associated with PLMs, 76 were associated with respiratory events. The patient  had a total of 0 Periodic Limb Movements.   Audio and video analysis did not show any abnormal or unusual movements, behaviors, phonations or vocalizations.  EKG was in keeping with normal sinus rhythm (NSR).  IMPRESSION:  1. This study found evidence of Obstructive Sleep Apnea (OSA) and therefore no MSLT has followed.   RECOMMENDATIONS:  1. Advise CPAP auto-titration, settings from 6-16 cm water, heated humidification and mask of choice. 2. Evaluation of new Epworth score in 60-90 days and if still very high consider repeat MSLT after a CPAP night.      I certify that I have reviewed the entire raw data recording prior to the issuance of this report in accordance with the Standards of Accreditation of the American Academy of Sleep Medicine (AASM)   Melvyn Novas, MD Diplomat, American Board of Psychiatry and Neurology  Diplomat, American Board of Sleep Medicine Wellsite geologist, [facility] [employer]

## 2020-08-10 NOTE — Progress Notes (Signed)
IMPRESSION:  1. This study found evidence of Obstructive Sleep Apnea (OSA) and therefore no MSLT has followed.   RECOMMENDATIONS:  1. Advise treatment for OSA first- this means CPAP auto-titration, settings from 6-16 cm water, heated humidification and mask of choice. 2. Evaluation of new Epworth score after CPAP therapy of 60-90 days and if still very high consider repeat MSLT after a PSG on CPAP night.

## 2020-08-10 NOTE — Addendum Note (Signed)
Addended by: Melvyn Novas on: 08/10/2020 05:27 PM   Modules accepted: Orders

## 2020-08-11 ENCOUNTER — Telehealth: Payer: Self-pay | Admitting: Neurology

## 2020-08-11 NOTE — Telephone Encounter (Signed)
I called pt. I advised pt that Dr. Vickey Huger reviewed their sleep study results and found that pt has moderate sleep apnea. Dr. Vickey Huger recommends that pt starts auto CPAP. I reviewed PAP compliance expectations with the pt. Pt is agreeable to starting a CPAP. I advised pt that an order will be sent to a DME, [employer], and [employer] will call the pt within about one week after they file with the pt's insurance. [employer] will show the pt how to use the machine, fit for masks, and troubleshoot the CPAP if needed. A follow up appt will need to be made for insurance purposes with Dr. Vickey Huger or NP. Pt verbalized understanding to call the office to be scheduled 31-90 days from the date she is set up.  A letter with all of this information in it will be sent to the pt as a reminder through my chart. Pt had no questions at this time but was encouraged to call back if questions arise. I have sent the order to [employer] and have received confirmation that they have received the order.  -

## 2020-08-11 NOTE — Telephone Encounter (Signed)
-----   Message from Melvyn Novas, MD sent at 08/10/2020  5:27 PM EST ----- IMPRESSION:  1. This study found evidence of Obstructive Sleep Apnea (OSA) and therefore no MSLT has followed.   RECOMMENDATIONS:  1. Advise treatment for OSA first- this means CPAP auto-titration, settings from 6-16 cm water, heated humidification and mask of choice. 2. Evaluation of new Epworth score after CPAP therapy of 60-90 days and if still very high consider repeat MSLT after a PSG on CPAP night.

## 2020-08-18 DIAGNOSIS — J029 Acute pharyngitis, unspecified: Secondary | ICD-10-CM | POA: Diagnosis not present

## 2020-08-18 DIAGNOSIS — R509 Fever, unspecified: Secondary | ICD-10-CM | POA: Diagnosis not present

## 2020-08-18 DIAGNOSIS — H6692 Otitis media, unspecified, left ear: Secondary | ICD-10-CM | POA: Diagnosis not present

## 2020-08-18 DIAGNOSIS — Z20822 Contact with and (suspected) exposure to covid-19: Secondary | ICD-10-CM | POA: Diagnosis not present

## 2020-08-18 DIAGNOSIS — J040 Acute laryngitis: Secondary | ICD-10-CM | POA: Diagnosis not present

## 2020-09-29 ENCOUNTER — Encounter: Payer: Self-pay | Admitting: Internal Medicine

## 2020-10-19 DIAGNOSIS — H6983 Other specified disorders of Eustachian tube, bilateral: Secondary | ICD-10-CM | POA: Diagnosis not present

## 2020-10-19 DIAGNOSIS — H65191 Other acute nonsuppurative otitis media, right ear: Secondary | ICD-10-CM | POA: Diagnosis not present

## 2020-11-17 DIAGNOSIS — Z6829 Body mass index (BMI) 29.0-29.9, adult: Secondary | ICD-10-CM | POA: Diagnosis not present

## 2020-11-17 DIAGNOSIS — Z124 Encounter for screening for malignant neoplasm of cervix: Secondary | ICD-10-CM | POA: Diagnosis not present

## 2020-11-17 DIAGNOSIS — Z01419 Encounter for gynecological examination (general) (routine) without abnormal findings: Secondary | ICD-10-CM | POA: Diagnosis not present

## 2020-11-22 LAB — HM PAP SMEAR: HM Pap smear: NEGATIVE

## 2020-11-29 DIAGNOSIS — N6021 Fibroadenosis of right breast: Secondary | ICD-10-CM | POA: Diagnosis not present

## 2020-11-29 DIAGNOSIS — N6011 Diffuse cystic mastopathy of right breast: Secondary | ICD-10-CM | POA: Diagnosis not present

## 2020-11-29 DIAGNOSIS — N6321 Unspecified lump in the left breast, upper outer quadrant: Secondary | ICD-10-CM | POA: Diagnosis not present

## 2020-11-29 DIAGNOSIS — Z803 Family history of malignant neoplasm of breast: Secondary | ICD-10-CM | POA: Diagnosis not present

## 2020-11-29 DIAGNOSIS — R922 Inconclusive mammogram: Secondary | ICD-10-CM | POA: Diagnosis not present

## 2020-11-29 LAB — HM MAMMOGRAPHY

## 2020-11-30 DIAGNOSIS — Z1329 Encounter for screening for other suspected endocrine disorder: Secondary | ICD-10-CM | POA: Diagnosis not present

## 2020-11-30 DIAGNOSIS — R1032 Left lower quadrant pain: Secondary | ICD-10-CM | POA: Diagnosis not present

## 2020-12-01 ENCOUNTER — Emergency Department (HOSPITAL_COMMUNITY): Payer: BC Managed Care – PPO

## 2020-12-01 ENCOUNTER — Other Ambulatory Visit: Payer: Self-pay

## 2020-12-01 ENCOUNTER — Encounter: Payer: Self-pay | Admitting: Internal Medicine

## 2020-12-01 ENCOUNTER — Ambulatory Visit (INDEPENDENT_AMBULATORY_CARE_PROVIDER_SITE_OTHER): Payer: BC Managed Care – PPO | Admitting: Internal Medicine

## 2020-12-01 ENCOUNTER — Emergency Department (HOSPITAL_COMMUNITY)
Admission: EM | Admit: 2020-12-01 | Discharge: 2020-12-01 | Disposition: A | Discharge status: Home / Self Care | Payer: BC Managed Care – PPO | Attending: Emergency Medicine | Admitting: Emergency Medicine

## 2020-12-01 ENCOUNTER — Encounter (HOSPITAL_COMMUNITY): Payer: Self-pay

## 2020-12-01 VITALS — BP 122/82 | HR 97 | Temp 98.0°F | Resp 16 | Ht 66.0 in | Wt 176.2 lb

## 2020-12-01 DIAGNOSIS — R519 Headache, unspecified: Secondary | ICD-10-CM

## 2020-12-01 DIAGNOSIS — J45909 Unspecified asthma, uncomplicated: Secondary | ICD-10-CM | POA: Diagnosis not present

## 2020-12-01 DIAGNOSIS — M542 Cervicalgia: Secondary | ICD-10-CM

## 2020-12-01 DIAGNOSIS — R42 Dizziness and giddiness: Secondary | ICD-10-CM

## 2020-12-01 DIAGNOSIS — R112 Nausea with vomiting, unspecified: Secondary | ICD-10-CM | POA: Insufficient documentation

## 2020-12-01 DIAGNOSIS — H748X3 Other specified disorders of middle ear and mastoid, bilateral: Secondary | ICD-10-CM | POA: Insufficient documentation

## 2020-12-01 LAB — BASIC METABOLIC PANEL
Anion gap: 8 (ref 5–15)
BUN: 9 mg/dL (ref 6–20)
CO2: 26 mmol/L (ref 22–32)
Calcium: 9.5 mg/dL (ref 8.9–10.3)
Chloride: 103 mmol/L (ref 98–111)
Creatinine, Ser: 0.68 mg/dL (ref 0.44–1.00)
GFR, Estimated: >60 mL/min (ref >60)
Glucose, Bld: 103 mg/dL — ABNORMAL HIGH (ref 70–99)
Potassium: 4.1 mmol/L (ref 3.5–5.1)
Sodium: 137 mmol/L (ref 135–145)

## 2020-12-01 LAB — CBC WITH DIFFERENTIAL/PLATELET
Abs Immature Granulocytes: 0.02 10*3/uL (ref 0.00–0.07)
Basophils Absolute: 0.1 10*3/uL (ref 0.0–0.1)
Basophils Relative: 1 %
Eosinophils Absolute: 0.2 10*3/uL (ref 0.0–0.5)
Eosinophils Relative: 3 %
HCT: 43.6 % (ref 36.0–46.0)
Hemoglobin: 14.6 g/dL (ref 12.0–15.0)
Immature Granulocytes: 0 %
Lymphocytes Relative: 16 %
Lymphs Abs: 1 10*3/uL (ref 0.7–4.0)
MCH: 29.9 pg (ref 26.0–34.0)
MCHC: 33.5 g/dL (ref 30.0–36.0)
MCV: 89.3 fL (ref 80.0–100.0)
Monocytes Absolute: 0.2 10*3/uL (ref 0.1–1.0)
Monocytes Relative: 3 %
Neutro Abs: 5 10*3/uL (ref 1.7–7.7)
Neutrophils Relative %: 77 %
Platelets: 225 10*3/uL (ref 150–400)
RBC: 4.88 MIL/uL (ref 3.87–5.11)
RDW: 12.7 % (ref 11.5–15.5)
WBC: 6.6 10*3/uL (ref 4.0–10.5)
nRBC: 0 % (ref 0.0–0.2)

## 2020-12-01 LAB — I-STAT BETA HCG BLOOD, ED (MC, WL, AP ONLY): I-stat hCG, quantitative: <5 m[IU]/mL (ref <5)

## 2020-12-01 LAB — TSH: TSH: 1.934 u[IU]/mL (ref 0.350–4.500)

## 2020-12-01 LAB — POCT URINE PREGNANCY: Preg Test, Ur: NEGATIVE

## 2020-12-01 LAB — MAGNESIUM: Magnesium: 2.1 mg/dL (ref 1.7–2.4)

## 2020-12-01 MED ORDER — LORAZEPAM 1 MG PO TABS
1.0000 mg | ORAL_TABLET | Freq: Once | ORAL | Status: AC
  Administered 2020-12-01: 1 mg via ORAL
  Filled 2020-12-01: qty 1

## 2020-12-01 MED ORDER — MECLIZINE HCL 25 MG PO TABS: 25.0000 mg | ORAL_TABLET | Freq: Two times a day (BID) | ORAL | 0 refills | Status: DC | PRN

## 2020-12-01 MED ORDER — MECLIZINE HCL 25 MG PO TABS
12.5000 mg | ORAL_TABLET | Freq: Once | ORAL | Status: AC
  Administered 2020-12-01: 12.5 mg via ORAL
  Filled 2020-12-01: qty 1

## 2020-12-01 MED ORDER — LORAZEPAM 1 MG PO TABS: 1.0000 mg | ORAL_TABLET | Freq: Every day | ORAL | 0 refills | Status: DC | PRN

## 2020-12-01 MED ORDER — MECLIZINE HCL 25 MG PO TABS
25.0000 mg | ORAL_TABLET | Freq: Once | ORAL | Status: AC
  Administered 2020-12-01: 25 mg via ORAL
  Filled 2020-12-01: qty 1

## 2020-12-01 NOTE — ED Notes (Signed)
Patient ambulated in the hall good.

## 2020-12-01 NOTE — ED Provider Notes (Signed)
[facility]-EMERGENCY DEPT Provider Note   CSN: [id number] Arrival date & time: 12/01/20  1217     History Chief Complaint  Patient presents with   Dizziness    Brandi Hall is a 30 y.o. female.  HPI Patient is a 30 year old female with a history of asthma, anxiety, allergic rhinitis, who presents to the emergency department due to dizziness that started 2 days ago.  Describes it as the room spinning vertically.  It is intermittent and worse with head movement as well as ambulation.  She states that it initially lasted for hours to days ago but then became intermittent.  She states that she woke this morning with a severe diffuse headache but has not typical of her prior headaches.  She states that she typically will get occipital headaches.  No numbness, weakness, chest pain, shortness of breath.  She reports intermittent nausea/vomiting when her dizziness worsens.  Of note, patient states that she has been dealing with recurrent ear infections recently.  She has an appointment with ENT in 2 weeks.  She states that in addition to her ear infection she has intermittent tinnitus as well as ear popping.    Past Medical History:  Diagnosis Date   Allergic rhinitis    Anxiety    Asthma    Lymph node abscess    hosp for infected lymph node (Salmonella) at 30 y/o   Post term pregnancy, 41 weeks 12/18/2014   Postpartum care following vaginal delivery (7/1) 12/18/2014    Patient Active Problem List   Diagnosis Date Noted   Narcolepsy with cataplexy 06/30/2020   Narcolepsy 05/19/2020   Nose pain 12/10/2017   PCP NOTES >>>>>>>>>>>>>>>>>>> 07/17/2016   Status post nail surgery 03/10/2015   Abdominal pain, epigastric 03/11/2014   Annual physical exam 04/01/2012   ALLERGIC RHINITIS 07/06/2008   Asthma 07/06/2008    Past Surgical History:  Procedure Laterality Date   LAD bx       OB History     Gravida  2   Para  1   Term  1   Preterm      AB       Living  1      SAB      IAB      Ectopic      Multiple  0   Live Births  1           Family History  Problem Relation Age of Onset   Breast cancer Mother        dx age late 67s   Uterine cancer Maternal Aunt    Colon cancer Other        Colon   Diabetes Other        GGF   Coronary artery disease Other    Congestive Heart Failure Other     Social History   Tobacco Use   Smoking status: Never   Smokeless tobacco: Never  Substance Use Topics   Alcohol use: Yes    Alcohol/week: 0.0 standard drinks    Comment: rarely   Drug use: No    Home Medications Prior to Admission medications   Medication Sig Start Date End Date Taking? Authorizing Provider  acetaminophen (TYLENOL) 500 MG tablet Take 1,000 mg by mouth every 6 (six) hours as needed for mild pain.   Yes [provider]  albuterol (VENTOLIN HFA) 108 (90 Base) MCG/ACT inhaler Inhale 2 puffs into the lungs every 6 (six) hours as needed  for wheezing or shortness of breath. 02/04/20  Yes Paz, Nolon Rod, MD  cetirizine (ZYRTEC) 10 MG tablet Take 10 mg by mouth daily.   Yes [provider]  Lactobacillus (PROBIOTIC ACIDOPHILUS PO) Take 1 capsule by mouth daily.   Yes [provider]  loratadine (CLARITIN) 10 MG tablet Take 10 mg by mouth daily.   Yes [provider]  LORazepam (ATIVAN) 1 MG tablet Take 1 tablet (1 mg total) by mouth daily as needed (dizziness that is not relieved with meclizine). 12/01/20  Yes Placido Sou, PA-C  meclizine (ANTIVERT) 25 MG tablet Take 1 tablet (25 mg total) by mouth 2 (two) times daily as needed for dizziness. 12/01/20  Yes Placido Sou, PA-C  ondansetron (ZOFRAN-ODT) 4 MG disintegrating tablet Take 4 mg by mouth every 8 (eight) hours as needed for nausea or vomiting.   Yes [provider]  OVER THE COUNTER MEDICATION Take 1 capsule by mouth daily. Veg juice plus   Yes [provider]  Tetrahydrozoline-Zn Sulfate (EYE DROPS  ALLERGY RELIEF OP) Place 1 drop into both eyes daily as needed (allergy).   Yes [provider]    Allergies    Gluten meal and Soy allergy  Review of Systems   Review of Systems  All other systems reviewed and are negative. Ten systems reviewed and are negative for acute change, except as noted in the HPI.   Physical Exam Updated Vital Signs BP 118/70   Pulse 76   Temp 97.7 F (36.5 C) (Oral)   Resp 20   LMP 11/08/2020 (Approximate)   SpO2 96%   Physical Exam Vitals and nursing note reviewed.  Constitutional:      General: She is not in acute distress.    Appearance: Normal appearance. She is not ill-appearing, toxic-appearing or diaphoretic.  HENT:     Head: Normocephalic and atraumatic.     Right Ear: Tympanic membrane, ear canal and external ear normal. There is no impacted cerumen.     Left Ear: Tympanic membrane, ear canal and external ear normal. There is no impacted cerumen.     Ears:     Comments: Bilateral ears, EACs, and and TMs appear normal.  Bilateral middle ear effusions.  Nonpurulent.  TM is not bulging.  Good bony landmarks.  TMs are pearly gray.    Nose: Nose normal.     Mouth/Throat:     Mouth: Mucous membranes are moist.     Pharynx: Oropharynx is clear. No oropharyngeal exudate or posterior oropharyngeal erythema.  Eyes:     General: No scleral icterus.       Right eye: No discharge.        Left eye: No discharge.     Extraocular Movements: Extraocular movements intact.     Conjunctiva/sclera: Conjunctivae normal.     Pupils: Pupils are equal, round, and reactive to light.     Comments: EOMI. PERRL.  Cardiovascular:     Rate and Rhythm: Normal rate and regular rhythm.     Pulses: Normal pulses.     Heart sounds: Normal heart sounds. No murmur heard.   No friction rub. No gallop.  Pulmonary:     Effort: Pulmonary effort is normal. No respiratory distress.     Breath sounds: Normal breath sounds. No stridor. No wheezing, rhonchi or rales.   Abdominal:     General: Abdomen is flat.     Tenderness: There is no abdominal tenderness.  Musculoskeletal:  General: Normal range of motion.     Cervical back: Normal range of motion and neck supple. No tenderness.  Skin:    General: Skin is warm and dry.  Neurological:     General: No focal deficit present.     Mental Status: She is alert and oriented to person, place, and time.     Comments: Patient is oriented to person, place, and time. Patient phonates in clear, complete, and coherent sentences. Negative arm drift. Strength is 5/5 in all four extremities. Distal sensation intact in all four extremities.  Psychiatric:        Mood and Affect: Mood normal.        Behavior: Behavior normal.   ED Results / Procedures / Treatments   Labs (all labs ordered are listed, but only abnormal results are displayed) Labs Reviewed  BASIC METABOLIC PANEL - Abnormal; Notable for the following components:      Result Value   Glucose, Bld 103 (*)    All other components within normal limits  CBC WITH DIFFERENTIAL/PLATELET  MAGNESIUM  TSH  I-STAT BETA HCG BLOOD, ED ([facility], [facility], [facility] ONLY)   EKG EKG Interpretation  Date/Time:  Wednesday December 01 2020 17:28:34 EDT Ventricular Rate:  75 PR Interval:  130 QRS Duration: 85 QT Interval:  406 QTC Calculation: 454 R Axis:   78 Text Interpretation: Sinus rhythm Confirmed by Cathren LaineSteinl, Kevin ([id number]) on 12/01/2020 5:31:32 PM  Radiology CT Head Wo Contrast  Result Date: 12/01/2020 CLINICAL DATA:  Intermittent dizziness since Monday.  New headache. EXAM: CT HEAD WITHOUT CONTRAST TECHNIQUE: Contiguous axial images were obtained from the base of the skull through the vertex without intravenous contrast. COMPARISON:  None. FINDINGS: Brain: No evidence of acute infarction, hemorrhage, hydrocephalus, extra-axial collection or mass lesion/mass effect. Vascular: No hyperdense vessel or unexpected calcification. Skull: Normal. Negative for fracture or focal  lesion. Sinuses/Orbits: No acute finding. Other: None. IMPRESSION: 1. Normal noncontrast head CT. Electronically Signed   By: Obie DredgeWilliam T Derry M.D.   On: 12/01/2020 13:53   MR BRAIN WO CONTRAST  Result Date: 12/01/2020 CLINICAL DATA:  Dizziness EXAM: MRI HEAD WITHOUT CONTRAST TECHNIQUE: Multiplanar, multiecho pulse sequences of the brain and surrounding structures were obtained without intravenous contrast. COMPARISON:  None. FINDINGS: Brain: No acute infarct, mass effect or extra-axial collection. No acute or chronic hemorrhage. There is multifocal hyperintense T2-weighted signal within the white matter. Parenchymal volume and CSF spaces are normal. The midline structures are normal. Vascular: Major flow voids are preserved. Skull and upper cervical spine: Normal calvarium and skull base. Visualized upper cervical spine and soft tissues are normal. Sinuses/Orbits:No paranasal sinus fluid levels or advanced mucosal thickening. Right mastoid effusion. Normal orbits. IMPRESSION: 1. No acute intracranial abnormality. 2. Multifocal hyperintense T2-weighted signal within the white matter, most commonly seen in the setting of chronic microvascular ischemia. Electronically Signed   By: Deatra RobinsonKevin  Herman M.D.   On: 12/01/2020 21:51    Procedures Procedures   Medications Ordered in ED Medications  meclizine (ANTIVERT) tablet 25 mg (25 mg Oral Given 12/01/20 1647)  LORazepam (ATIVAN) tablet 1 mg (1 mg Oral Given 12/01/20 1721)  meclizine (ANTIVERT) tablet 12.5 mg (12.5 mg Oral Given 12/01/20 2235)   ED Course  I have reviewed the triage vital signs and the nursing notes.  Pertinent labs & imaging results that were available during my care of the patient were reviewed by me and considered in my medical decision making (see chart for details).  Clinical Course as of  12/01/20 2304  Wed Dec 01, 2020  1843 Patient reassessed and she states that her dizziness has improved significantly.  Awaiting MRI.  Will reassess.  [LJ]    Clinical Course User Index [LJ] Placido Sou, PA-C   MDM Rules/Calculators/A&P                          Pt is a 30 y.o. female who presents to the emergency department due to dizziness.  Labs: CBC without abnormalities. BMP with a glucose of 103. Magnesium of 2.1. TSH 1.934. I-STAT beta-hCG less than 5.  Imaging: CT scan of the head shows normal noncontrast head CT. MRI of the brain shows no acute intracranial abnormality.  Multifocal hyperintense T2 weighted signal within the white matter most commonly seen in the setting of chronic microvascular ischemia.  I, Placido Sou, PA-C, personally reviewed and evaluated these images and lab results as part of my medical decision-making.  Patient reports dizziness for the past 2 to 3 days.  Has been experiencing recurrent ear infections and inner ear issues.  Exam consistent with a peripheral vertigo.  Patient also reports a significant headache earlier today that is atypical for her.  Due to this I also obtained an MRI which showed no acute intracranial abnormalities.  Discussed findings at length with the patient.  She was given meclizine as well as Ativan for her symptoms and notes moderate improvement.  She was ambulated in the emergency department without difficulty.  Patient given a prescription for additional meclizine as well as Ativan.  We discussed safety regarding these medications.  She has a follow-up with ENT in 2 weeks.  Discussed return precautions.  Her questions were answered and she was amicable at the time of discharge.  Note: Portions of this report may have been transcribed using voice recognition software. Every effort was made to ensure accuracy; however, inadvertent computerized transcription errors may be present.   Final Clinical Impression(s) / ED Diagnoses Final diagnoses:  Dizziness    Rx / DC Orders ED Discharge Orders          Ordered    LORazepam (ATIVAN) 1 MG tablet  Daily PRN         12/01/20 2218    meclizine (ANTIVERT) 25 MG tablet  2 times daily PRN        12/01/20 2218             Placido Sou, PA-C 12/01/20 2309    Cathren Laine, MD 12/02/20 1650

## 2020-12-01 NOTE — Patient Instructions (Signed)
Please go to the [facility], ER now. I spoke with the doctor on-call and he agreed to see you.

## 2020-12-01 NOTE — ED Notes (Signed)
Called MRI, they stated pt was 3rd on list and will hopefully go for MRI around 2000-2030

## 2020-12-01 NOTE — Progress Notes (Signed)
Subjective:    Patient ID: Brandi Hall, female    DOB: Jun 25, 1990, 30 y.o.   MRN: [id number]  DOS:  12/01/2020 Type of visit - description: acute   Sxs started 2 days ago: Woke up and got dizzy as soon as she got up: Sxs are spinning, + associated N, dry heaves, worse w/ certain movements. Decrease w/ rest but never goes completely away  When asked, reports had a chiropractor manipulate her neck ~ 10 days ago. Also admits to neck pain , mostly @ the R posterior area   Review of Systems Has problems w/ R ear poppping x a while, to see ENT in 2 weeks No F/C + allergies w/ RN and watery eyes  Having a HA today, no diplopia, slurred speech or motor deficits  Past Medical History:  Diagnosis Date   Allergic rhinitis    Anxiety    Asthma    Lymph node abscess    hosp for infected lymph node (Salmonella) at 30 y/o   Post term pregnancy, 41 weeks 12/18/2014   Postpartum care following vaginal delivery (7/1) 12/18/2014    Past Surgical History:  Procedure Laterality Date   LAD bx      Allergies as of 12/01/2020       Reactions   Gluten Meal Diarrhea   Soy Allergy Nausea And Vomiting        Medication List        Accurate as of December 01, 2020 11:59 PM. If you have any questions, ask your nurse or doctor.          STOP taking these medications    Magnesium 100 MG Caps Stopped by: Willow Ora, MD   prenatal multivitamin Tabs tablet Stopped by: Willow Ora, MD       TAKE these medications    acetaminophen 500 MG tablet Commonly known as: TYLENOL Take 1,000 mg by mouth every 6 (six) hours as needed for mild pain.   albuterol 108 (90 Base) MCG/ACT inhaler Commonly known as: VENTOLIN HFA Inhale 2 puffs into the lungs every 6 (six) hours as needed for wheezing or shortness of breath.   cetirizine 10 MG tablet Commonly known as: ZYRTEC Take 10 mg by mouth daily.   EYE DROPS ALLERGY RELIEF OP Place 1 drop into both eyes daily as needed (allergy).    loratadine 10 MG tablet Commonly known as: CLARITIN Take 10 mg by mouth daily.   LORazepam 1 MG tablet Commonly known as: Ativan Take 1 tablet (1 mg total) by mouth daily as needed (dizziness that is not relieved with meclizine).   meclizine 25 MG tablet Commonly known as: ANTIVERT Take 1 tablet (25 mg total) by mouth 2 (two) times daily as needed for dizziness.   ondansetron 4 MG disintegrating tablet Commonly known as: ZOFRAN-ODT Take 4 mg by mouth every 8 (eight) hours as needed for nausea or vomiting.   OVER THE COUNTER MEDICATION Take 1 capsule by mouth daily. Veg juice plus   PROBIOTIC ACIDOPHILUS PO Take 1 capsule by mouth daily. What changed: Another medication with the same name was removed. Continue taking this medication, and follow the directions you see here. Changed by: Willow Ora, MD           Objective:   Physical Exam BP 122/82 (BP Location: Left Arm, Patient Position: Sitting, Cuff Size: Small)   Pulse 97   Temp 98 F (36.7 C) (Oral)   Resp 16   Ht 5\' 6"  (1.676 m)  Wt 176 lb 4 oz (79.9 kg)   LMP 11/08/2020 (Exact Date)   SpO2 97%   Breastfeeding Yes   BMI 28.45 kg/m  General:   Well developed, uncomfortable d/t dizzines, walks slowly HEENT:  Normocephalic . Face symmetric, atraumatic Lungs:  CTA B Normal respiratory effort, no intercostal retractions, no accessory muscle use. Heart: RRR,  no murmur.  Lower extremities: no pretibial edema bilaterally  Skin: Not pale. Not jaundice Neurologic:  alert & oriented X3.  Speech normal, gait appropriate for age and unassisted EOMI, pupils equal and reactive. Romberg: Present. Psych--  Cognition and judgment appear intact.  Cooperative with normal attention span and concentration.  Behavior appropriate. No anxious or depressed appearing.      Assessment     Assessment Anxiety Asthma, allergies H/o lymph node abscesses d/t Salmonella at age 52 Gluten intolerant, on a gluten free diet   OSA, per sleep study 07-2020 group, Rx CPAP. Covid dx 08/01/2019, dx @ the [facility] , treated at home, mild   PLAN: Severe dizziness, headache, some neck pain, recent neck chiropractor manipulation: I am concerned about her symptoms, neurological exam nonfocal but Romberg was present. I think she needs further evaluation to rule out vertebral dissection, MRI/MRA brain and neck or CT angiogram. I spoke with the emergency room doctor at [facility] with my concerns and he agreed to see the patient.  I appreciate his help. I did a urinary pregnancy test in preparation for possible scans, negative. Above discussed with the patient and she verbalized understanding.    Time spent with the patient: 30 minutes.  Visit was prolonged as I help to coordinate her care, also explained the patient why I  think she needs further evaluation today this visit occurred during the SARS-CoV-2 public health emergency.  Safety protocols were in place, including screening questions prior to the visit, additional usage of staff PPE, and extensive cleaning of exam room while observing appropriate contact time as indicated for disinfecting solutions.

## 2020-12-01 NOTE — ED Provider Notes (Signed)
Emergency Medicine Provider Triage Evaluation Note  Brandi Hall , a 30 y.o. female  was evaluated in triage.  Pt complains of room-spinning dizziness beginning Monday, June 13, intermittent, usually for several hours in the morning. New headache this morning, severe. Today sx started around 8:30am, constant since then.   Review of Systems  Positive: Dizziness, headache Negative: Syncope,   Physical Exam  BP 125/81 (BP Location: Right Arm)   Pulse (!) 108   Temp 97.7 F (36.5 C) (Oral)   Resp 17   LMP 11/08/2020 (Approximate)   SpO2 100%  Gen:   Awake, no distress   Resp:  Normal effort  MSK:   Moves extremities without difficulty  Other:    Medical Decision Making  Medically screening exam initiated at 1:00 PM.  Appropriate orders placed.  Brandi Hall was informed that the remainder of the evaluation will be completed by another provider, this initial triage assessment does not replace that evaluation, and the importance of remaining in the ED until their evaluation is complete.     Anselm Pancoast, PA-C 12/01/20 1304    Bethann Berkshire, MD 12/03/20 [phone]

## 2020-12-01 NOTE — ED Triage Notes (Signed)
Pt arrived via walk in, c/o room spinning dizziness on and off since Monday, and headache that started today. States she has had ear pain and "popping" for about a month and believes this has thrown off her equilibrium.

## 2020-12-01 NOTE — Discharge Instructions (Addendum)
I prescribed you 2 medications.  The first medication is called meclizine.  You can take this up to three times a day for the next 7 days.  This will help with your dizziness.  The second medication is called Ativan.  This is a benzodiazepine and is similar to medication such as Xanax.  Only take this as needed if you are experiencing dizziness that is not resolving with meclizine.  Do not mix this with alcohol.  Do not drive a car after taking it.  Do not breastfeed while taking this medication.   If you develop new or worsening symptoms please come back to the emergency department.  Otherwise, please follow-up with your regular doctor as well as your ENT.  It was a pleasure to meet you.

## 2020-12-02 NOTE — Assessment & Plan Note (Signed)
Severe dizziness, headache, some neck pain, recent neck chiropractor manipulation: I am concerned about her symptoms, neurological exam nonfocal but Romberg was present. I think she needs further evaluation to rule out vertebral dissection, MRI/MRA brain and neck or CT angiogram. I spoke with the emergency room doctor at [facility] with my concerns and he agreed to see the patient.  I appreciate his help. I did a urinary pregnancy test in preparation for possible scans, negative. Above discussed with the patient and she verbalized understanding.

## 2020-12-08 ENCOUNTER — Ambulatory Visit: Payer: BC Managed Care – PPO | Admitting: Internal Medicine

## 2020-12-08 ENCOUNTER — Ambulatory Visit (INDEPENDENT_AMBULATORY_CARE_PROVIDER_SITE_OTHER): Payer: BC Managed Care – PPO | Admitting: Otolaryngology

## 2020-12-09 ENCOUNTER — Encounter: Payer: Self-pay | Admitting: Internal Medicine

## 2020-12-15 ENCOUNTER — Other Ambulatory Visit: Payer: Self-pay

## 2020-12-15 ENCOUNTER — Ambulatory Visit (INDEPENDENT_AMBULATORY_CARE_PROVIDER_SITE_OTHER): Payer: BC Managed Care – PPO | Admitting: Internal Medicine

## 2020-12-15 ENCOUNTER — Encounter: Payer: Self-pay | Admitting: Internal Medicine

## 2020-12-15 ENCOUNTER — Ambulatory Visit (INDEPENDENT_AMBULATORY_CARE_PROVIDER_SITE_OTHER): Payer: BC Managed Care – PPO | Admitting: Otolaryngology

## 2020-12-15 VITALS — BP 122/80 | HR 79 | Temp 97.8°F | Resp 16 | Ht 66.0 in | Wt 177.5 lb

## 2020-12-15 DIAGNOSIS — J310 Chronic rhinitis: Secondary | ICD-10-CM | POA: Diagnosis not present

## 2020-12-15 DIAGNOSIS — R9089 Other abnormal findings on diagnostic imaging of central nervous system: Secondary | ICD-10-CM

## 2020-12-15 DIAGNOSIS — R42 Dizziness and giddiness: Secondary | ICD-10-CM | POA: Diagnosis not present

## 2020-12-15 DIAGNOSIS — H9311 Tinnitus, right ear: Secondary | ICD-10-CM | POA: Diagnosis not present

## 2020-12-15 DIAGNOSIS — H8110 Benign paroxysmal vertigo, unspecified ear: Secondary | ICD-10-CM | POA: Diagnosis not present

## 2020-12-15 DIAGNOSIS — J343 Hypertrophy of nasal turbinates: Secondary | ICD-10-CM

## 2020-12-15 DIAGNOSIS — J342 Deviated nasal septum: Secondary | ICD-10-CM

## 2020-12-15 DIAGNOSIS — E282 Polycystic ovarian syndrome: Secondary | ICD-10-CM

## 2020-12-15 MED ORDER — TRIAMCINOLONE ACETONIDE 55 MCG/ACT NA AERO: 2.0000 | INHALATION_SPRAY | Freq: Every day | NASAL | 12 refills | Status: DC

## 2020-12-15 NOTE — Progress Notes (Addendum)
HPI: Brandi Hall is a 30 y.o. female who presents is referred by her PCP Dr. Drue Novel for evaluation of hearing complaints and tinnitus.  She had noticed some fluctuating hearing problems especially in the right ear.  She also noted previously some pain behind the right ear.  She has noticed chronic problems with her eustachian tube function again which is generally worse on the right side.  She is hearing much better today.  She described episode of losing hearing in her right ear that gradually got better but she still has some tinnitus in the ear. In addition to this she has had history of positional vertigo that she is treated with the Epley maneuver and it is doing better presently.  The positional vertigo apparently lasted for couple months but she had vertigo in certain positions that lasted less than a minute.  But this has gotten better using the Epley maneuver. In addition to this she has obstructive sleep apnea for which she is using CPAP machine.  She was noted to have a deviated septum and intermittent nasal obstruction. I reviewed her MRI scan that was performed 2 weeks ago for evaluation of her dizziness and this demonstrated some effusion in the right mastoid area but no obvious middle ear effusion. She was also noted on the MRI scan to have a deviated septum to the left with possible polyp in the right nasal cavity.  But no significant sinus disease.  Past Medical History:  Diagnosis Date   Allergic rhinitis    Anxiety    Asthma    Lymph node abscess    hosp for infected lymph node (Salmonella) at 30 y/o   Post term pregnancy, 41 weeks 12/18/2014   Postpartum care following vaginal delivery (7/1) 12/18/2014   Past Surgical History:  Procedure Laterality Date   LAD bx     Social History   Socioeconomic History   Marital status: Married    Spouse name: Not on file   Number of children: 1   Years of education: Not on file   Highest education level: Not on file  Occupational  History   Occupation: finished college, works-- Tourist information centre manager  Tobacco Use   Smoking status: Never   Smokeless tobacco: Never  Substance and Sexual Activity   Alcohol use: Yes    Alcohol/week: 0.0 standard drinks    Comment: rarely   Drug use: No   Sexual activity: Not on file  Other Topics Concern   Not on file  Social History Narrative   1 daughter -- 2016    Social Determinants of Health   Financial Resource Strain: Not on file  Food Insecurity: Not on file  Transportation Needs: Not on file  Physical Activity: Not on file  Stress: Not on file  Social Connections: Not on file   Family History  Problem Relation Age of Onset   Breast cancer Mother        dx age late 76s   Uterine cancer Maternal Aunt    Colon cancer Other        Colon   Diabetes Other        GGF   Coronary artery disease Other    Congestive Heart Failure Other    Allergies  Allergen Reactions   Gluten Meal Diarrhea   Soy Allergy Nausea And Vomiting   Prior to Admission medications   Medication Sig Start Date End Date Taking? Authorizing Provider  acetaminophen (TYLENOL) 500 MG tablet Take 1,000 mg by mouth  every 6 (six) hours as needed for mild pain.    [provider]  albuterol (VENTOLIN HFA) 108 (90 Base) MCG/ACT inhaler Inhale 2 puffs into the lungs every 6 (six) hours as needed for wheezing or shortness of breath. 02/04/20   Wanda Plump, MD  cetirizine (ZYRTEC) 10 MG tablet Take 10 mg by mouth daily.    [provider]  Lactobacillus (PROBIOTIC ACIDOPHILUS PO) Take 1 capsule by mouth daily.    [provider]  loratadine (CLARITIN) 10 MG tablet Take 10 mg by mouth daily.    [provider]  LORazepam (ATIVAN) 1 MG tablet Take 1 tablet (1 mg total) by mouth daily as needed (dizziness that is not relieved with meclizine). 12/01/20   Placido Sou, PA-C  meclizine (ANTIVERT) 25 MG tablet Take 1 tablet (25 mg total) by mouth 2 (two) times daily as needed for  dizziness. 12/01/20   Placido Sou, PA-C  ondansetron (ZOFRAN-ODT) 4 MG disintegrating tablet Take 4 mg by mouth every 8 (eight) hours as needed for nausea or vomiting.    [provider]  OVER THE COUNTER MEDICATION Take 1 capsule by mouth daily. Veg juice plus    [provider]  Tetrahydrozoline-Zn Sulfate (EYE DROPS ALLERGY RELIEF OP) Place 1 drop into both eyes daily as needed (allergy).    [provider]     Positive ROS: Otherwise negative  All other systems have been reviewed and were otherwise negative with the exception of those mentioned in the HPI and as above.  Physical Exam: Constitutional: Alert, well-appearing, no acute distress Ears: External ears without lesions or tenderness. Ear canals are clear bilaterally.  On microscopic exam both TMs are clear with good mobility on pneumatic otoscopy. Nasal: External nose without lesions. Septum is deviated to the left..  Left middle meatus is slightly congested but no mucopurulent discharge noted.  On anterior rhinoscopy on the right side she has a polypoid mass within the right nasal cavity in the region of the right middle meatus.  No mucopurulent discharge noted but moderate edema with polypoid mass possibly arising from the right middle turbinate or right middle meatus. Oral: Lips and gums without lesions. Tongue and palate mucosa without lesions. Posterior oropharynx clear.  Clear tonsil regions bilaterally.  Average sized tonsils bilaterally. Neck: No palpable adenopathy or masses Respiratory: Breathing comfortably  Skin: No facial/neck lesions or rash noted.  Procedures  Assessment: Septal deviation to the left with polypoid mass on the right side involving the right middle turbinate.  This appears benign.  But is contributing to her nasal obstruction.  This can be visualized on the recent MRI scan.  Sinuses are  clear otherwise. History of benign paroxysmal positional vertigo. Normal hearing  evaluation today but history of eustachian tube dysfunction.  Plan: Would recommend regular use of Nasacort 2 sprays each nostril at night as this will only help with nasal congestion as well as help with eustachian tube dysfunction. If she has a lot of trouble with nasal congestion and use of CPAP could consider surgical intervention which would require septoplasty turbinate reductions and removal of the right nasal polypoid mass. I also gave her information on benign paroxysmal positional vertigo and treatment with Epley maneuver which she is familiar with.   Narda Bonds, MD   CC:

## 2020-12-15 NOTE — Patient Instructions (Signed)
Please come back fasting for blood work  We are referring you to a neurologist.  Recommend to avoid neck manipulation

## 2020-12-15 NOTE — Progress Notes (Signed)
Subjective:    Patient ID: Brandi Hall, female    DOB: 29-Aug-1990, 30 y.o.   MRN: [id number]  DOS:  12/15/2020 Type of visit - description: ER follow-up At the last visit, she was referred to the ER due to severe dizziness   Work-up: CT head without normal MRI brain without contrast no acute, multifocal hyperintense T2 weighted signal commonly seen on chronic microvascular ischemia. BMP, magnesium, TSH, CBC, were okay.  Review of Systems Since she left the hospital, she did the Epley maneuver few times and shortly after she felt back to normal. Subsequently saw ENT.   Past Medical History:  Diagnosis Date   Allergic rhinitis    Anxiety    Asthma    Lymph node abscess    hosp for infected lymph node (Salmonella) at 30 y/o   PCOS (polycystic ovarian syndrome)    Post term pregnancy, 41 weeks 12/18/2014   Postpartum care following vaginal delivery (7/1) 12/18/2014    Past Surgical History:  Procedure Laterality Date   LAD bx      Allergies as of 12/15/2020       Reactions   Gluten Meal Diarrhea   Soy Allergy Nausea And Vomiting        Medication List        Accurate as of December 15, 2020 11:59 PM. If you have any questions, ask your nurse or doctor.          acetaminophen 500 MG tablet Commonly known as: TYLENOL Take 1,000 mg by mouth every 6 (six) hours as needed for mild pain.   albuterol 108 (90 Base) MCG/ACT inhaler Commonly known as: VENTOLIN HFA Inhale 2 puffs into the lungs every 6 (six) hours as needed for wheezing or shortness of breath.   cetirizine 10 MG tablet Commonly known as: ZYRTEC Take 10 mg by mouth daily.   EYE DROPS ALLERGY RELIEF OP Place 1 drop into both eyes daily as needed (allergy).   loratadine 10 MG tablet Commonly known as: CLARITIN Take 10 mg by mouth daily.   LORazepam 1 MG tablet Commonly known as: Ativan Take 1 tablet (1 mg total) by mouth daily as needed (dizziness that is not relieved with meclizine).    meclizine 25 MG tablet Commonly known as: ANTIVERT Take 1 tablet (25 mg total) by mouth 2 (two) times daily as needed for dizziness.   ondansetron 4 MG disintegrating tablet Commonly known as: ZOFRAN-ODT Take 4 mg by mouth every 8 (eight) hours as needed for nausea or vomiting.   OVER THE COUNTER MEDICATION Take 1 capsule by mouth daily. Veg juice plus   OVER THE COUNTER MEDICATION Myo-inositol   PROBIOTIC ACIDOPHILUS PO Take 1 capsule by mouth daily.   triamcinolone 55 MCG/ACT Aero nasal inhaler Commonly known as: NASACORT Place 2 sprays into the nose daily. 2 sprays each nostril at night. Started by: Dillard Cannon, MD           Objective:   Physical Exam BP 122/80 (BP Location: Left Arm, Patient Position: Sitting, Cuff Size: Small)   Pulse 79   Temp 97.8 F (36.6 C) (Oral)   Resp 16   Ht 5\' 6"  (1.676 m)   Wt 177 lb 8 oz (80.5 kg)   SpO2 98%   Breastfeeding No   BMI 28.65 kg/m  General:   Well developed, NAD, BMI noted. HEENT:  Normocephalic . Face symmetric, atraumatic Lungs:  CTA B Normal respiratory effort, no intercostal retractions, no accessory muscle use. Heart:  RRR,  no murmur.  Lower extremities: no pretibial edema bilaterally  Skin: Not pale. Not jaundice Neurologic:  alert & oriented X3.  Speech normal, gait appropriate for age and unassisted.  EOMI, pupils equal and reactive, motor symmetric Psych--  Cognition and judgment appear intact.  Cooperative with normal attention span and concentration.  Behavior appropriate. No anxious or depressed appearing.      Assessment      Assessment Anxiety Asthma, allergies H/o lymph node abscesses d/t Salmonella at age 37 Gluten intolerant, on a gluten free diet  OSA, per sleep study 07-2020 group, Rx CPAP, not using it as off 11/2020. Covid dx 08/01/2019, dx @ the [facility] , treated at home, mild  PLAN: Severe dizziness, headache, neck pain: Work-up the ER showed no acute issues, symptoms  resolved, sxs  likely d/t  a vertigo episode. No further eval is needed.  Rec to avoid chiropractor manipulation of the neck. Abnormal brain MRI: See report,  " multifocal hyperintense T2 weighted signal commonly seen on chronic microvascular ischemia". Unclear what is the meaning of that on a 30 year old patient. Plan: She will come back fasting for FLP, A1c.  Referral to neurology to clarify the meaning of the MRI. Deviated septum: After the ER visit, went to ENT, was diagnosed with a nose polyp, deviated septum, Rx Nasacort and they are considering surgery.      This visit occurred during the SARS-CoV-2 public health emergency.  Safety protocols were in place, including screening questions prior to the visit, additional usage of staff PPE, and extensive cleaning of exam room while observing appropriate contact time as indicated for disinfecting solutions.

## 2020-12-16 ENCOUNTER — Encounter: Payer: Self-pay | Admitting: Internal Medicine

## 2020-12-16 NOTE — Assessment & Plan Note (Signed)
Severe dizziness, headache, neck pain: Work-up the ER showed no acute issues, symptoms resolved, sxs  likely d/t  a vertigo episode. No further eval is needed.  Rec to avoid chiropractor manipulation of the neck. Abnormal brain MRI: See report,  " multifocal hyperintense T2 weighted signal commonly seen on chronic microvascular ischemia". Unclear what is the meaning of that on a 30 year old patient. Plan: She will come back fasting for FLP, A1c.  Referral to neurology to clarify the meaning of the MRI. Deviated septum: After the ER visit, went to ENT, was diagnosed with a nose polyp, deviated septum, Rx Nasacort and they are considering surgery.

## 2020-12-25 DIAGNOSIS — U071 COVID-19: Secondary | ICD-10-CM | POA: Diagnosis not present

## 2021-01-12 DIAGNOSIS — G4733 Obstructive sleep apnea (adult) (pediatric): Secondary | ICD-10-CM | POA: Diagnosis not present

## 2021-01-17 DIAGNOSIS — G4733 Obstructive sleep apnea (adult) (pediatric): Secondary | ICD-10-CM | POA: Diagnosis not present

## 2021-01-18 ENCOUNTER — Telehealth: Payer: Self-pay | Admitting: Neurology

## 2021-01-18 NOTE — Telephone Encounter (Signed)
I called patient off wait list to see if she was able to come in tomorrow. Patient was not able to and let me know she just received her CPAP and wanted to know if she needed to make a separate f/u for that or if both can be addressed in the same visit in September.

## 2021-01-18 NOTE — Telephone Encounter (Signed)
Called pt back. She stated on cpap machine 01/14/21. She has appt on 03/01/21 w/ Dr. Vickey Huger for new evaluation for ab MRI. Advised Dr. Vickey Huger should be able to combine appt to do initial pap f/u as well but just in case she cannot, we scheduled separate appt for 03/07/21 at 10 am w/ AL,NP. She is aware if she addresses w/ Dr. Vickey Huger on 03/01/21, she can cx this appt.

## 2021-02-04 ENCOUNTER — Encounter: Payer: BC Managed Care – PPO | Admitting: Internal Medicine

## 2021-02-15 ENCOUNTER — Ambulatory Visit (INDEPENDENT_AMBULATORY_CARE_PROVIDER_SITE_OTHER): Payer: BC Managed Care – PPO | Admitting: Internal Medicine

## 2021-02-15 ENCOUNTER — Encounter: Payer: Self-pay | Admitting: Internal Medicine

## 2021-02-15 ENCOUNTER — Other Ambulatory Visit: Payer: Self-pay

## 2021-02-15 VITALS — BP 122/82 | HR 89 | Temp 98.0°F | Resp 16 | Ht 66.0 in | Wt 176.4 lb

## 2021-02-15 DIAGNOSIS — Z803 Family history of malignant neoplasm of breast: Secondary | ICD-10-CM | POA: Diagnosis not present

## 2021-02-15 DIAGNOSIS — Z0000 Encounter for general adult medical examination without abnormal findings: Secondary | ICD-10-CM | POA: Diagnosis not present

## 2021-02-15 DIAGNOSIS — Z1159 Encounter for screening for other viral diseases: Secondary | ICD-10-CM | POA: Diagnosis not present

## 2021-02-15 LAB — LIPID PANEL
Cholesterol: 178 mg/dL (ref 0–200)
HDL: 56.1 mg/dL (ref >39.00)
LDL Cholesterol: 89 mg/dL (ref 0–99)
NonHDL: 122.28
Total CHOL/HDL Ratio: 3
Triglycerides: 164 mg/dL — ABNORMAL HIGH (ref 0.0–149.0)
VLDL: 32.8 mg/dL (ref 0.0–40.0)

## 2021-02-15 LAB — HEMOGLOBIN A1C: Hgb A1c MFr Bld: 5.1 % (ref 4.6–6.5)

## 2021-02-15 LAB — HEPATIC FUNCTION PANEL
ALT: 17 U/L (ref 0–35)
AST: 14 U/L (ref 0–37)
Albumin: 4.4 g/dL (ref 3.5–5.2)
Alkaline Phosphatase: 40 U/L (ref 39–117)
Bilirubin, Direct: 0.1 mg/dL (ref 0.0–0.3)
Total Bilirubin: 0.6 mg/dL (ref 0.2–1.2)
Total Protein: 7.1 g/dL (ref 6.0–8.3)

## 2021-02-15 NOTE — Assessment & Plan Note (Signed)
-  Td 2013  - pnm 23: 2015 (asthma) -COVID VAX x3 -Rec flu shot yearly -Female care per gynecology -Labs: LFTs, FLP, A1c, hep C. -Diet and exercise: discussed - FH breast cancer: Patient is concerned about it, she has been told she has a dense breast, will refer her to a geneticist.  Might need more aggressive screening with mammograms/MRIs.

## 2021-02-15 NOTE — Pre-Procedure Instructions (Signed)
Surgical Instructions   Your procedure is scheduled on Tuesday, September 6th. Report to [facility] Main Entrance "A" at 07:15 A.M., then check in with the Admitting office. Call this number if you have problems the morning of surgery: [phone]   If you have any questions prior to your surgery date call [phone]: Open Monday-Friday 8am-4pm   Remember: Do not eat after midnight the night before your surgery  You may drink clear liquids until 06:15 AM the morning of your surgery.   Clear liquids allowed are: Water, Non-Citrus Juices (without pulp), Carbonated Beverages, Clear Tea, Black Coffee ONLY (NO MILK, CREAM OR POWDERED CREAMER of any kind), and Gatorade   Take these medicines the morning of surgery with A SIP OF WATER  Norgestimate-Ethinyl Estradiol Triphasic    If needed: acetaminophen (TYLENOL) albuterol (VENTOLIN HFA)- bring inhaler with you on day of surgery Tetrahydrozoline-Zn Sulfate (EYE DROPS ALLERGY RELIEF OP)   As of today, STOP taking any Aspirin (unless otherwise instructed by your surgeon) Aleve, Naproxen, Ibuprofen, Motrin, Advil, Goody's, BC's, all herbal medications, fish oil, and all vitamins.          Do not wear jewelry or makeup Do not wear lotions, powders, perfumes, or deodorant. Do not shave 48 hours prior to surgery.   Do not bring valuables to the hospital. [facility] is not responsible for any belongings or valuables. Do not wear nail polish, gel polish, artificial nails, or any other type of covering on natural nails including finger and toenails. If patients have artificial nails, gel coating, etc. that need to be removed by a nail salon please have this removed prior to surgery or surgery may need to be canceled/delayed if the surgeon/ anesthesia feels like the patient is unable to be adequately monitored.               Do NOT Smoke (Tobacco/Vaping) or drink Alcohol 24 hours prior to your procedure If you use a CPAP at night, you may  bring all equipment for your overnight stay.   Contacts, glasses, dentures or bridgework may not be worn into surgery, please bring cases for these belongings   For patients admitted to the hospital, discharge time will be determined by your treatment team.   Patients discharged the day of surgery will not be allowed to drive home, and someone needs to stay with them for 24 hours.  ONLY 1 SUPPORT PERSON MAY BE PRESENT WHILE YOU ARE IN SURGERY. IF YOU ARE TO BE ADMITTED ONCE YOU ARE IN YOUR ROOM YOU WILL BE ALLOWED TWO (2) VISITORS.  Minor children may have two parents present. Special consideration for safety and communication needs will be reviewed on a case by case basis.  Special instructions:    Oral Hygiene is also important to reduce your risk of infection.  Remember - BRUSH YOUR TEETH THE MORNING OF SURGERY WITH YOUR REGULAR TOOTHPASTE   [facility]- Preparing For Surgery  Before surgery, you can play an important role. Because skin is not sterile, your skin needs to be as free of germs as possible. You can reduce the number of germs on your skin by washing with CHG (chlorahexidine gluconate) Soap before surgery.  CHG is an antiseptic cleaner which kills germs and bonds with the skin to continue killing germs even after washing.     Please do not use if you have an allergy to CHG or antibacterial soaps. If your skin becomes reddened/irritated stop using the CHG.  Do not shave (including  legs and underarms) for at least 48 hours prior to first CHG shower. It is OK to shave your face.  Please follow these instructions carefully.     Shower the NIGHT BEFORE SURGERY and the MORNING OF SURGERY with CHG Soap.   If you chose to wash your hair, wash your hair first as usual with your normal shampoo. After you shampoo, rinse your hair and body thoroughly to remove the shampoo.  Then [employer] and genitals (private parts) with your normal soap and rinse thoroughly to remove soap.  After  that Use CHG Soap as you would any other liquid soap. You can apply CHG directly to the skin and wash gently with a scrungie or a clean washcloth.   Apply the CHG Soap to your body ONLY FROM THE NECK DOWN.  Do not use on open wounds or open sores. Avoid contact with your eyes, ears, mouth and genitals (private parts). Wash Face and genitals (private parts)  with your normal soap.   Wash thoroughly, paying special attention to the area where your surgery will be performed.  Thoroughly rinse your body with warm water from the neck down.  DO NOT shower/wash with your normal soap after using and rinsing off the CHG Soap.  Pat yourself dry with a CLEAN TOWEL.  Wear CLEAN PAJAMAS to bed the night before surgery  Place CLEAN SHEETS on your bed the night before your surgery  DO NOT SLEEP WITH PETS.   Day of Surgery:  Take a shower with CHG soap. Wear Clean/Comfortable clothing the morning of surgery Do not apply any deodorants/lotions.   Remember to brush your teeth WITH YOUR REGULAR TOOTHPASTE.   Please read over the following fact sheets that you were given.

## 2021-02-15 NOTE — Patient Instructions (Addendum)
We will refer you to the geneticist due to your family history of breast cancer  GO TO THE LAB : Get the blood work     GO TO THE FRONT DESK, PLEASE SCHEDULE YOUR APPOINTMENTS Come back for a physical exam in 1 year

## 2021-02-15 NOTE — Assessment & Plan Note (Signed)
Here for CPX Severe dizziness, headache neck pain: see last OV, sxs essentially resolved Abnormal brain MRI: To see neurology soon. IBS: Self diagnosed, diarrhea/constipation/ abdominal pain chronic for years.  No weight loss, no blood in the stools.  Observation Deviated septum: To have surgery soon OSA: Hopes that deviated septum surgery will facilitate use of CPAP (has some forgetfulness, probably related to OSA). RTC 1 year

## 2021-02-15 NOTE — Progress Notes (Signed)
Subjective:    Patient ID: Brandi Hall, female    DOB: 23-Dec-1990, 30 y.o.   MRN: [id number]  DOS:  02/15/2021 Type of visit - description: CPX We discussed several issues today. In general feels well. She self diagnosed IBS, occasional left-sided abdominal pain, constipation and diarrhea on and off for many years. No blood in the stools. No fever chills. No weight loss. Occasionally feels forgetful.    Review of Systems  Other than above, a 14 point review of systems is negative     Past Medical History:  Diagnosis Date   Allergic rhinitis    Anxiety    Asthma    Lymph node abscess    hosp for infected lymph node (Salmonella) at 30 y/o   PCOS (polycystic ovarian syndrome)    Post term pregnancy, 41 weeks 12/18/2014   Postpartum care following vaginal delivery (7/1) 12/18/2014    Past Surgical History:  Procedure Laterality Date   LAD bx     Social History   Socioeconomic History   Marital status: Married    Spouse name: Not on file   Number of children: 2   Years of education: Not on file   Highest education level: Not on file  Occupational History   Occupation: finished college, works-- Tourist information centre manager  Tobacco Use   Smoking status: Never   Smokeless tobacco: Never  Substance and Sexual Activity   Alcohol use: Yes    Alcohol/week: 0.0 standard drinks    Comment: rarely   Drug use: No   Sexual activity: Not on file  Other Topics Concern   Not on file  Social History Narrative   2 daughter - 2016 , 39   Social Determinants of Health   Financial Resource Strain: Not on file  Food Insecurity: Not on file  Transportation Needs: Not on file  Physical Activity: Not on file  Stress: Not on file  Social Connections: Not on file  Intimate Partner Violence: Not on file    Allergies as of 02/15/2021       Reactions   Gluten Meal Diarrhea   Soy Allergy Nausea And Vomiting   Wound Dressing Adhesive Rash   Glue on Bandaids        Medication  List        Accurate as of February 15, 2021  4:36 PM. If you have any questions, ask your nurse or doctor.          STOP taking these medications    LORazepam 1 MG tablet Commonly known as: Ativan Stopped by: Willow Ora, MD   meclizine 25 MG tablet Commonly known as: ANTIVERT Stopped by: Willow Ora, MD       TAKE these medications    acetaminophen 500 MG tablet Commonly known as: TYLENOL Take 1,000 mg by mouth every 6 (six) hours as needed for mild pain.   albuterol 108 (90 Base) MCG/ACT inhaler Commonly known as: VENTOLIN HFA Inhale 2 puffs into the lungs every 6 (six) hours as needed for wheezing or shortness of breath.   ASHWAGANDHA PO Take 1 capsule by mouth daily.   EYE DROPS ALLERGY RELIEF OP Place 1 drop into both eyes daily as needed (allergy).   JUICE PLUS FIBRE PO Take 3 capsules by mouth daily. Veggie   MAGNESIUM CITRATE PO Take 1 tablet by mouth daily.   Norgestimate-Ethinyl Estradiol Triphasic 0.18/0.215/0.25 MG-25 MCG tab Take 1 tablet by mouth daily.   OVER THE COUNTER MEDICATION Take 4 capsules by  mouth daily. Myo-inositol   PROBIOTIC ACIDOPHILUS PO Take 1 capsule by mouth daily.   triamcinolone 55 MCG/ACT Aero nasal inhaler Commonly known as: NASACORT Place 2 sprays into the nose daily. 2 sprays each nostril at night. What changed:  when to take this reasons to take this additional instructions           Objective:   Physical Exam BP 122/82 (BP Location: Left Arm, Patient Position: Sitting, Cuff Size: Small)   Pulse 89   Temp 98 F (36.7 C) (Oral)   Resp 16   Ht 5\' 6"  (1.676 m)   Wt 176 lb 6 oz (80 kg)   LMP 02/15/2021 (Exact Date)   SpO2 98%   BMI 28.47 kg/m  General: Well developed, NAD, BMI noted Neck: No  thyromegaly  HEENT:  Normocephalic . Face symmetric, atraumatic Lungs:  CTA B Normal respiratory effort, no intercostal retractions, no accessory muscle use. Heart: RRR,  no murmur.  Abdomen:  Not distended,  soft, non-tender. No rebound or rigidity.   Lower extremities: no pretibial edema bilaterally  Skin: Exposed areas without rash. Not pale. Not jaundice Neurologic:  alert & oriented X3.  Speech normal, gait appropriate for age and unassisted Strength symmetric and appropriate for age.  Psych: Cognition and judgment appear intact.  Cooperative with normal attention span and concentration.  Behavior appropriate. No anxious or depressed appearing.     Assessment      Assessment Anxiety Asthma, allergies H/o lymph node abscesses d/t Salmonella at age 16 Gluten intolerant, on a gluten free diet  OSA, per sleep study 07-2020 group, Rx CPAP, not using it as off 11/2020. FH breast ca mother age 46 (reportedly neg genetic testing) , personal h/o dense breast tissue  IBS self dx sx since teenage years Covid dx 08/01/2019, dx @ the [facility] , treated at home, mild  PLAN: Here for CPX Severe dizziness, headache neck pain: see last OV, sxs essentially resolved Abnormal brain MRI: To see neurology soon. IBS: Self diagnosed, diarrhea/constipation/ abdominal pain chronic for years.  No weight loss, no blood in the stools.  Observation Deviated septum: To have surgery soon OSA: Hopes that deviated septum surgery will facilitate use of CPAP (has some forgetfulness, probably related to OSA). RTC 1 year    This visit occurred during the SARS-CoV-2 public health emergency.  Safety protocols were in place, including screening questions prior to the visit, additional usage of staff PPE, and extensive cleaning of exam room while observing appropriate contact time as indicated for disinfecting solutions.

## 2021-02-16 ENCOUNTER — Ambulatory Visit (INDEPENDENT_AMBULATORY_CARE_PROVIDER_SITE_OTHER): Payer: Self-pay | Admitting: Otolaryngology

## 2021-02-16 ENCOUNTER — Other Ambulatory Visit: Payer: Self-pay

## 2021-02-16 ENCOUNTER — Encounter (HOSPITAL_COMMUNITY): Payer: Self-pay

## 2021-02-16 ENCOUNTER — Encounter (HOSPITAL_COMMUNITY)
Admission: RE | Admit: 2021-02-16 | Discharge: 2021-02-16 | Disposition: A | Discharge status: Home / Self Care | Payer: BC Managed Care – PPO | Source: Ambulatory Visit | Attending: Otolaryngology | Admitting: Otolaryngology

## 2021-02-16 DIAGNOSIS — Z01812 Encounter for preprocedural laboratory examination: Secondary | ICD-10-CM | POA: Diagnosis not present

## 2021-02-16 DIAGNOSIS — J342 Deviated nasal septum: Secondary | ICD-10-CM

## 2021-02-16 HISTORY — DX: Bronchitis, not specified as acute or chronic: J40

## 2021-02-16 HISTORY — DX: Sleep apnea, unspecified: G47.30

## 2021-02-16 HISTORY — DX: Postpartum depression: F53.0

## 2021-02-16 HISTORY — DX: Other mental disorders complicating the puerperium: O99.345

## 2021-02-16 HISTORY — DX: Pneumonia, unspecified organism: J18.9

## 2021-02-16 LAB — CBC
HCT: 41.2 % (ref 36.0–46.0)
Hemoglobin: 13.8 g/dL (ref 12.0–15.0)
MCH: 29.9 pg (ref 26.0–34.0)
MCHC: 33.5 g/dL (ref 30.0–36.0)
MCV: 89.2 fL (ref 80.0–100.0)
Platelets: 241 10*3/uL (ref 150–400)
RBC: 4.62 MIL/uL (ref 3.87–5.11)
RDW: 12.6 % (ref 11.5–15.5)
WBC: 5.9 10*3/uL (ref 4.0–10.5)
nRBC: 0 % (ref 0.0–0.2)

## 2021-02-16 LAB — HEPATITIS C ANTIBODY
Hepatitis C Ab: NONREACTIVE
SIGNAL TO CUT-OFF: 0.01 (ref <1.00)

## 2021-02-16 NOTE — H&P (View-Only) (Signed)
PREOPERATIVE H&P  Chief Complaint: Chronic nasal obstruction  HPI: Brandi Hall is a 30 y.o. female who presents for evaluation of chronic nasal obstruction making it difficult to use CPAP machine for obstructive sleep apnea.  On exam in the office patient has a septal deviation to the left with turbinate hypertrophy as well as a polypoid mass arising from the right middle turbinate.  She is taken operating room at this time for septoplasty, bilateral inferior turbinate reductions and removal of right intranasal polypoid mass.  CT scan of the head demonstrated mild mucosal swelling within the ethmoid region but clear paranasal sinuses otherwise.  Past Medical History:  Diagnosis Date   Allergic rhinitis    Anxiety    Asthma    Bronchitis    Lymph node abscess    hosp for infected lymph node (Salmonella) at 30 y/o   PCOS (polycystic ovarian syndrome)    Pneumonia    Post partum depression    Post term pregnancy, 41 weeks 12/18/2014   Postpartum care following vaginal delivery (7/1) 12/18/2014   Sleep apnea    CPAP @ [facility]   Past Surgical History:  Procedure Laterality Date   LAD bx     WISDOM TOOTH EXTRACTION     Social History   Socioeconomic History   Marital status: Married    Spouse name: Not on file   Number of children: 2   Years of education: Not on file   Highest education level: Not on file  Occupational History   Occupation: finished college, works-- Tourist information centre manager  Tobacco Use   Smoking status: Never   Smokeless tobacco: Never  Vaping Use   Vaping Use: Never used  Substance and Sexual Activity   Alcohol use: Yes    Alcohol/week: 0.0 standard drinks    Comment: rarely   Drug use: No   Sexual activity: Not on file  Other Topics Concern   Not on file  Social History Narrative   2 daughter - 2016 , 8   Social Determinants of Corporate investment banker Strain: Not on file  Food Insecurity: Not on file  Transportation Needs: Not on file  Physical  Activity: Not on file  Stress: Not on file  Social Connections: Not on file   Family History  Problem Relation Age of Onset   Breast cancer Mother        dx age late 84s   Uterine cancer Maternal Aunt    Colon cancer Other        Colon   Diabetes Other        GGF   Coronary artery disease Other    Congestive Heart Failure Other    Allergies  Allergen Reactions   Gluten Meal Diarrhea   Soy Allergy Nausea And Vomiting   Wound Dressing Adhesive Rash    Glue on Bandaids   Prior to Admission medications   Medication Sig Start Date End Date Taking? Authorizing Provider  acetaminophen (TYLENOL) 500 MG tablet Take 1,000 mg by mouth every 6 (six) hours as needed for mild pain.    [provider]  albuterol (VENTOLIN HFA) 108 (90 Base) MCG/ACT inhaler Inhale 2 puffs into the lungs every 6 (six) hours as needed for wheezing or shortness of breath. 02/04/20   Wanda Plump, MD  ASHWAGANDHA PO Take 1 capsule by mouth daily.    [provider]  Lactobacillus (PROBIOTIC ACIDOPHILUS PO) Take 1 capsule by mouth daily.    [provider]  MAGNESIUM CITRATE PO Take 1 tablet by mouth daily.    [provider]  Norgestimate-Ethinyl Estradiol Triphasic 0.18/0.215/0.25 MG-25 MCG tab Take 1 tablet by mouth daily.    [provider]  Nutritional Supplements (JUICE PLUS FIBRE PO) Take 3 capsules by mouth daily. Veggie    [provider]  OVER THE COUNTER MEDICATION Take 4 capsules by mouth daily. Myo-inositol    [provider]  Tetrahydrozoline-Zn Sulfate (EYE DROPS ALLERGY RELIEF OP) Place 1 drop into both eyes daily as needed (allergy).    [provider]  triamcinolone (NASACORT) 55 MCG/ACT AERO nasal inhaler Place 2 sprays into the nose daily. 2 sprays each nostril at night. Patient taking differently: Place 2 sprays into the nose daily as needed (allergies). 12/15/20   Drema Halon, MD     Positive ROS: Otherwise  negative  All other systems have been reviewed and were otherwise negative with the exception of those mentioned in the HPI and as above.  Physical Exam: There were no vitals filed for this visit.  General: Alert, no acute distress Oral: Normal oral mucosa and tonsils Nasal: Septum is deviated to the left.  She has bilateral enlarged inferior turbinates.  After decongesting the nose on nasal endoscopy she has a polypoid mass arising from the right middle turbinate. Neck: No palpable adenopathy or thyroid nodules Ear: Ear canal is clear with normal appearing TMs Cardiovascular: Regular rate and rhythm, no murmur.  Respiratory: Clear to auscultation Neurologic: Alert and oriented x 3   Assessment/Plan: Nasal obstruction secondary to septal deviation and turbinate hypertrophy as well as polypoid mass involving the right middle turbinate Plan for septoplasty, turbinate reductions and removal of right middle turbinate polypoid mass   Dillard Cannon, MD 02/16/2021 1:37 PM

## 2021-02-16 NOTE — Progress Notes (Signed)
PCP - Willow Ora, MD Cardiologist - Denies  PPM/ICD - Denies  Chest x-ray - N/A EKG - 12/01/20; NSR Stress Test - Denies ECHO - Denies Cardiac Cath - Denies  Sleep Study - 08/02/20, Positive for OSA CPAP - Yes, nightly  DM- Denies  Blood Thinner Instructions: N/A Aspirin Instructions:N/A  ERAS Protcol - Yes PRE-SURGERY Ensure or G2- Not ordred  COVID TEST- N/A; Ambulatory   Anesthesia review: No  Patient denies shortness of breath, fever, cough and chest pain at PAT appointment   All instructions explained to the patient, with a verbal understanding of the material. Patient agrees to go over the instructions while at home for a better understanding. The opportunity to ask questions was provided.

## 2021-02-16 NOTE — Progress Notes (Signed)
IBM'd Dr. Ezzard Standing for procedure orders.

## 2021-02-16 NOTE — Pre-Procedure Instructions (Signed)
Surgical Instructions   Your procedure is scheduled on Tuesday, September 6th. Report to [facility] Main Entrance "A" at 07:15 A.M., then check in with the Admitting office. Call this number if you have problems the morning of surgery: [phone]   If you have any questions prior to your surgery date call [phone]: Open Monday-Friday 8am-4pm   Remember: Do not eat after midnight the night before your surgery  You may drink clear liquids until 06:15 AM the morning of your surgery.   Clear liquids allowed are: Water, Non-Citrus Juices (without pulp), Carbonated Beverages, Clear Tea, Black Coffee ONLY (NO MILK, CREAM OR POWDERED CREAMER of any kind), and Gatorade   Take these medicines the morning of surgery with A SIP OF WATER:  Norgestimate-Ethinyl Estradiol Triphasic   If needed: acetaminophen (TYLENOL) albuterol (VENTOLIN HFA)- bring inhaler with you on day of surgery Tetrahydrozoline-Zn Sulfate (EYE DROPS ALLERGY RELIEF OP) triamcinolone (NASACORT) nasal inhaler   As of today, STOP taking any Aspirin (unless otherwise instructed by your surgeon) Aleve, Naproxen, Ibuprofen, Motrin, Advil, Goody's, BC's, all herbal medications, fish oil, and all vitamins.          Do not wear jewelry or makeup Do not wear lotions, powders, perfumes, or deodorant. Do not shave 48 hours prior to surgery.   Do not bring valuables to the hospital. [facility] is not responsible for any belongings or valuables. Do not wear nail polish, gel polish, artificial nails, or any other type of covering on natural nails including finger and toenails. If patients have artificial nails, gel coating, etc. that need to be removed by a nail salon please have this removed prior to surgery or surgery may need to be canceled/delayed if the surgeon/ anesthesia feels like the patient is unable to be adequately monitored.               Do NOT Smoke (Tobacco/Vaping) or drink Alcohol 24 hours prior to your  procedure If you use a CPAP at night, you may bring all equipment for your overnight stay.   Contacts, glasses, dentures or bridgework may not be worn into surgery, please bring cases for these belongings   For patients admitted to the hospital, discharge time will be determined by your treatment team.   Patients discharged the day of surgery will not be allowed to drive home, and someone needs to stay with them for 24 hours.  ONLY 1 SUPPORT PERSON MAY BE PRESENT WHILE YOU ARE IN SURGERY. IF YOU ARE TO BE ADMITTED ONCE YOU ARE IN YOUR ROOM YOU WILL BE ALLOWED TWO (2) VISITORS.  Minor children may have two parents present. Special consideration for safety and communication needs will be reviewed on a case by case basis.  Special instructions:    Oral Hygiene is also important to reduce your risk of infection.  Remember - BRUSH YOUR TEETH THE MORNING OF SURGERY WITH YOUR REGULAR TOOTHPASTE   [place]- Preparing For Surgery  Before surgery, you can play an important role. Because skin is not sterile, your skin needs to be as free of germs as possible. You can reduce the number of germs on your skin by washing with CHG (chlorahexidine gluconate) Soap before surgery.  CHG is an antiseptic cleaner which kills germs and bonds with the skin to continue killing germs even after washing.     Please do not use if you have an allergy to CHG or antibacterial soaps. If your skin becomes reddened/irritated stop using the CHG.  Do  not shave (including legs and underarms) for at least 48 hours prior to first CHG shower. It is OK to shave your face.  Please follow these instructions carefully.     Shower the NIGHT BEFORE SURGERY and the MORNING OF SURGERY with CHG Soap.   If you chose to wash your hair, wash your hair first as usual with your normal shampoo. After you shampoo, rinse your hair and body thoroughly to remove the shampoo.  Then [employer] and genitals (private parts) with your normal soap  and rinse thoroughly to remove soap.  After that Use CHG Soap as you would any other liquid soap. You can apply CHG directly to the skin and wash gently with a scrungie or a clean washcloth.   Apply the CHG Soap to your body ONLY FROM THE NECK DOWN.  Do not use on open wounds or open sores. Avoid contact with your eyes, ears, mouth and genitals (private parts). Wash Face and genitals (private parts)  with your normal soap.   Wash thoroughly, paying special attention to the area where your surgery will be performed.  Thoroughly rinse your body with warm water from the neck down.  DO NOT shower/wash with your normal soap after using and rinsing off the CHG Soap.  Pat yourself dry with a CLEAN TOWEL.  Wear CLEAN PAJAMAS to bed the night before surgery  Place CLEAN SHEETS on your bed the night before your surgery  DO NOT SLEEP WITH PETS.   Day of Surgery:  Take a shower with CHG soap. Wear Clean/Comfortable clothing the morning of surgery Do not apply any deodorants/lotions.   Remember to brush your teeth WITH YOUR REGULAR TOOTHPASTE.   Please read over the following fact sheets that you were given.

## 2021-02-16 NOTE — H&P (Signed)
PREOPERATIVE H&P  Chief Complaint: Chronic nasal obstruction  HPI: Brandi Hall is a 30 y.o. female who presents for evaluation of chronic nasal obstruction making it difficult to use CPAP machine for obstructive sleep apnea.  On exam in the office patient has a septal deviation to the left with turbinate hypertrophy as well as a polypoid mass arising from the right middle turbinate.  She is taken operating room at this time for septoplasty, bilateral inferior turbinate reductions and removal of right intranasal polypoid mass.  CT scan of the head demonstrated mild mucosal swelling within the ethmoid region but clear paranasal sinuses otherwise.  Past Medical History:  Diagnosis Date   Allergic rhinitis    Anxiety    Asthma    Bronchitis    Lymph node abscess    hosp for infected lymph node (Salmonella) at 30 y/o   PCOS (polycystic ovarian syndrome)    Pneumonia    Post partum depression    Post term pregnancy, 41 weeks 12/18/2014   Postpartum care following vaginal delivery (7/1) 12/18/2014   Sleep apnea    CPAP @ [facility]   Past Surgical History:  Procedure Laterality Date   LAD bx     WISDOM TOOTH EXTRACTION     Social History   Socioeconomic History   Marital status: Married    Spouse name: Not on file   Number of children: 2   Years of education: Not on file   Highest education level: Not on file  Occupational History   Occupation: finished college, works-- dance teacher  Tobacco Use   Smoking status: Never   Smokeless tobacco: Never  Vaping Use   Vaping Use: Never used  Substance and Sexual Activity   Alcohol use: Yes    Alcohol/week: 0.0 standard drinks    Comment: rarely   Drug use: No   Sexual activity: Not on file  Other Topics Concern   Not on file  Social History Narrative   2 daughter - 2016 , 2020   Social Determinants of Health   Financial Resource Strain: Not on file  Food Insecurity: Not on file  Transportation Needs: Not on file  Physical  Activity: Not on file  Stress: Not on file  Social Connections: Not on file   Family History  Problem Relation Age of Onset   Breast cancer Mother        dx age late 30s   Uterine cancer Maternal Aunt    Colon cancer Other        Colon   Diabetes Other        GGF   Coronary artery disease Other    Congestive Heart Failure Other    Allergies  Allergen Reactions   Gluten Meal Diarrhea   Soy Allergy Nausea And Vomiting   Wound Dressing Adhesive Rash    Glue on Bandaids   Prior to Admission medications   Medication Sig Start Date End Date Taking? Authorizing Provider  acetaminophen (TYLENOL) 500 MG tablet Take 1,000 mg by mouth every 6 (six) hours as needed for mild pain.    [provider]  albuterol (VENTOLIN HFA) 108 (90 Base) MCG/ACT inhaler Inhale 2 puffs into the lungs every 6 (six) hours as needed for wheezing or shortness of breath. 02/04/20   Paz, Jose E, MD  ASHWAGANDHA PO Take 1 capsule by mouth daily.    [provider]  Lactobacillus (PROBIOTIC ACIDOPHILUS PO) Take 1 capsule by mouth daily.    [provider]    MAGNESIUM CITRATE PO Take 1 tablet by mouth daily.    [provider]  Norgestimate-Ethinyl Estradiol Triphasic 0.18/0.215/0.25 MG-25 MCG tab Take 1 tablet by mouth daily.    [provider]  Nutritional Supplements (JUICE PLUS FIBRE PO) Take 3 capsules by mouth daily. Veggie    [provider]  OVER THE COUNTER MEDICATION Take 4 capsules by mouth daily. Myo-inositol    [provider]  Tetrahydrozoline-Zn Sulfate (EYE DROPS ALLERGY RELIEF OP) Place 1 drop into both eyes daily as needed (allergy).    [provider]  triamcinolone (NASACORT) 55 MCG/ACT AERO nasal inhaler Place 2 sprays into the nose daily. 2 sprays each nostril at night. Patient taking differently: Place 2 sprays into the nose daily as needed (allergies). 12/15/20   Drema Halon, MD     Positive ROS: Otherwise  negative  All other systems have been reviewed and were otherwise negative with the exception of those mentioned in the HPI and as above.  Physical Exam: There were no vitals filed for this visit.  General: Alert, no acute distress Oral: Normal oral mucosa and tonsils Nasal: Septum is deviated to the left.  She has bilateral enlarged inferior turbinates.  After decongesting the nose on nasal endoscopy she has a polypoid mass arising from the right middle turbinate. Neck: No palpable adenopathy or thyroid nodules Ear: Ear canal is clear with normal appearing TMs Cardiovascular: Regular rate and rhythm, no murmur.  Respiratory: Clear to auscultation Neurologic: Alert and oriented x 3   Assessment/Plan: Nasal obstruction secondary to septal deviation and turbinate hypertrophy as well as polypoid mass involving the right middle turbinate Plan for septoplasty, turbinate reductions and removal of right middle turbinate polypoid mass   Dillard Cannon, MD 02/16/2021 1:37 PM

## 2021-02-17 DIAGNOSIS — G4733 Obstructive sleep apnea (adult) (pediatric): Secondary | ICD-10-CM | POA: Diagnosis not present

## 2021-02-21 ENCOUNTER — Other Ambulatory Visit: Payer: Self-pay | Admitting: Internal Medicine

## 2021-02-22 ENCOUNTER — Other Ambulatory Visit: Payer: Self-pay

## 2021-02-22 ENCOUNTER — Encounter (HOSPITAL_COMMUNITY)
Admission: RE | Disposition: A | Discharge status: Home / Self Care | Payer: Self-pay | Source: Home / Self Care | Attending: Otolaryngology

## 2021-02-22 ENCOUNTER — Observation Stay (HOSPITAL_COMMUNITY)
Admission: RE | Admit: 2021-02-22 | Discharge: 2021-02-23 | Disposition: A | Discharge status: Home / Self Care | Payer: BC Managed Care – PPO | Attending: Otolaryngology | Admitting: Otolaryngology

## 2021-02-22 ENCOUNTER — Encounter (HOSPITAL_COMMUNITY): Payer: Self-pay | Admitting: Otolaryngology

## 2021-02-22 ENCOUNTER — Ambulatory Visit (HOSPITAL_COMMUNITY): Payer: BC Managed Care – PPO

## 2021-02-22 DIAGNOSIS — J343 Hypertrophy of nasal turbinates: Secondary | ICD-10-CM | POA: Diagnosis not present

## 2021-02-22 DIAGNOSIS — J339 Nasal polyp, unspecified: Secondary | ICD-10-CM | POA: Diagnosis not present

## 2021-02-22 DIAGNOSIS — J342 Deviated nasal septum: Secondary | ICD-10-CM | POA: Diagnosis not present

## 2021-02-22 DIAGNOSIS — J3489 Other specified disorders of nose and nasal sinuses: Secondary | ICD-10-CM | POA: Diagnosis not present

## 2021-02-22 DIAGNOSIS — J330 Polyp of nasal cavity: Secondary | ICD-10-CM | POA: Diagnosis not present

## 2021-02-22 HISTORY — PX: MASS EXCISION: SHX2000

## 2021-02-22 HISTORY — PX: NASAL SEPTOPLASTY W/ TURBINOPLASTY: SHX2070

## 2021-02-22 LAB — POCT PREGNANCY, URINE: Preg Test, Ur: NEGATIVE

## 2021-02-22 SURGERY — SEPTOPLASTY, NOSE, WITH NASAL TURBINATE REDUCTION
Anesthesia: General | Site: Nose | Laterality: Right

## 2021-02-22 MED ORDER — LIDOCAINE 2% (20 MG/ML) 5 ML SYRINGE
INTRAMUSCULAR | Status: DC | PRN
  Administered 2021-02-22: 60 mg via INTRAVENOUS

## 2021-02-22 MED ORDER — 0.9 % SODIUM CHLORIDE (POUR BTL) OPTIME
TOPICAL | Status: DC | PRN
  Administered 2021-02-22: 1000 mL

## 2021-02-22 MED ORDER — ORAL CARE MOUTH RINSE: 15.0000 mL | Freq: Once | OROMUCOSAL | Status: AC

## 2021-02-22 MED ORDER — KETOROLAC TROMETHAMINE 30 MG/ML IJ SOLN
INTRAMUSCULAR | Status: DC | PRN
  Administered 2021-02-22: 30 mg via INTRAVENOUS

## 2021-02-22 MED ORDER — PROMETHAZINE HCL 25 MG/ML IJ SOLN
6.2500 mg | INTRAMUSCULAR | Status: AC | PRN
  Administered 2021-02-22 (×2): 6.25 mg via INTRAVENOUS

## 2021-02-22 MED ORDER — KCL IN DEXTROSE-NACL 20-5-0.45 MEQ/L-%-% IV SOLN
INTRAVENOUS | Status: DC
  Filled 2021-02-22: qty 1000

## 2021-02-22 MED ORDER — SUGAMMADEX SODIUM 200 MG/2ML IV SOLN
INTRAVENOUS | Status: DC | PRN
  Administered 2021-02-22: 160 mg via INTRAVENOUS

## 2021-02-22 MED ORDER — FENTANYL CITRATE (PF) 100 MCG/2ML IJ SOLN: 25.0000 ug | INTRAMUSCULAR | Status: DC | PRN

## 2021-02-22 MED ORDER — ONDANSETRON HCL 4 MG/2ML IJ SOLN
INTRAMUSCULAR | Status: DC | PRN
  Administered 2021-02-22: 4 mg via INTRAVENOUS

## 2021-02-22 MED ORDER — MUPIROCIN 2 % EX OINT
TOPICAL_OINTMENT | CUTANEOUS | Status: DC | PRN
  Administered 2021-02-22: 1 via NASAL

## 2021-02-22 MED ORDER — OXYCODONE HCL 5 MG PO TABS: 5.0000 mg | ORAL_TABLET | Freq: Once | ORAL | Status: DC | PRN

## 2021-02-22 MED ORDER — HYDROCODONE-ACETAMINOPHEN 5-325 MG PO TABS: 1.0000 | ORAL_TABLET | ORAL | Status: DC | PRN

## 2021-02-22 MED ORDER — CHLORHEXIDINE GLUCONATE 0.12 % MT SOLN
15.0000 mL | Freq: Once | OROMUCOSAL | Status: AC
  Administered 2021-02-22: 15 mL via OROMUCOSAL
  Filled 2021-02-22: qty 15

## 2021-02-22 MED ORDER — PROMETHAZINE HCL 25 MG/ML IJ SOLN
INTRAMUSCULAR | Status: AC
  Filled 2021-02-22: qty 1

## 2021-02-22 MED ORDER — OXYMETAZOLINE HCL 0.05 % NA SOLN
NASAL | Status: AC
  Filled 2021-02-22: qty 30

## 2021-02-22 MED ORDER — MORPHINE SULFATE (PF) 2 MG/ML IV SOLN: 2.0000 mg | INTRAVENOUS | Status: DC | PRN

## 2021-02-22 MED ORDER — PROPOFOL 10 MG/ML IV BOLUS
INTRAVENOUS | Status: DC | PRN
  Administered 2021-02-22: 200 mg via INTRAVENOUS

## 2021-02-22 MED ORDER — ACETAMINOPHEN 10 MG/ML IV SOLN: 1000.0000 mg | Freq: Once | INTRAVENOUS | Status: DC | PRN

## 2021-02-22 MED ORDER — CEFAZOLIN SODIUM-DEXTROSE 2-4 GM/100ML-% IV SOLN
2.0000 g | INTRAVENOUS | Status: AC
  Administered 2021-02-22: 2 g via INTRAVENOUS
  Filled 2021-02-22: qty 100

## 2021-02-22 MED ORDER — OXYCODONE HCL 5 MG/5ML PO SOLN: 5.0000 mg | Freq: Once | ORAL | Status: DC | PRN

## 2021-02-22 MED ORDER — ONDANSETRON HCL 4 MG/2ML IJ SOLN: 4.0000 mg | INTRAMUSCULAR | Status: DC | PRN

## 2021-02-22 MED ORDER — ROCURONIUM BROMIDE 10 MG/ML (PF) SYRINGE
PREFILLED_SYRINGE | INTRAVENOUS | Status: DC | PRN
  Administered 2021-02-22: 40 mg via INTRAVENOUS

## 2021-02-22 MED ORDER — AMISULPRIDE (ANTIEMETIC) 5 MG/2ML IV SOLN
10.0000 mg | Freq: Once | INTRAVENOUS | Status: AC | PRN
  Administered 2021-02-22: 10 mg via INTRAVENOUS

## 2021-02-22 MED ORDER — SCOPOLAMINE 1 MG/3DAYS TD PT72
MEDICATED_PATCH | TRANSDERMAL | Status: AC
  Filled 2021-02-22: qty 1

## 2021-02-22 MED ORDER — MIDAZOLAM HCL 2 MG/2ML IJ SOLN
INTRAMUSCULAR | Status: DC | PRN
  Administered 2021-02-22: 2 mg via INTRAVENOUS

## 2021-02-22 MED ORDER — ONDANSETRON HCL 4 MG PO TABS: 4.0000 mg | ORAL_TABLET | ORAL | Status: DC | PRN

## 2021-02-22 MED ORDER — MUPIROCIN 2 % EX OINT
TOPICAL_OINTMENT | CUTANEOUS | Status: AC
  Filled 2021-02-22: qty 22

## 2021-02-22 MED ORDER — ACETAMINOPHEN 500 MG PO TABS
1000.0000 mg | ORAL_TABLET | Freq: Four times a day (QID) | ORAL | Status: DC | PRN
  Administered 2021-02-22 (×2): 1000 mg via ORAL
  Filled 2021-02-22 (×2): qty 2

## 2021-02-22 MED ORDER — SODIUM CHLORIDE 0.9 % IR SOLN
Status: DC | PRN
  Administered 2021-02-22: 1000 mL

## 2021-02-22 MED ORDER — FENTANYL CITRATE (PF) 250 MCG/5ML IJ SOLN
INTRAMUSCULAR | Status: AC
  Filled 2021-02-22: qty 5

## 2021-02-22 MED ORDER — AMISULPRIDE (ANTIEMETIC) 5 MG/2ML IV SOLN
INTRAVENOUS | Status: AC
  Filled 2021-02-22: qty 4

## 2021-02-22 MED ORDER — CEFAZOLIN SODIUM-DEXTROSE 1-4 GM/50ML-% IV SOLN
1.0000 g | Freq: Three times a day (TID) | INTRAVENOUS | Status: DC
Start: 2021-02-22 — End: 2021-02-23
  Administered 2021-02-22 – 2021-02-23 (×2): 1 g via INTRAVENOUS
  Filled 2021-02-22 (×3): qty 50

## 2021-02-22 MED ORDER — LIDOCAINE 2% (20 MG/ML) 5 ML SYRINGE
INTRAMUSCULAR | Status: AC
  Filled 2021-02-22: qty 5

## 2021-02-22 MED ORDER — KETOROLAC TROMETHAMINE 30 MG/ML IJ SOLN
INTRAMUSCULAR | Status: AC
  Filled 2021-02-22: qty 1

## 2021-02-22 MED ORDER — OXYMETAZOLINE HCL 0.05 % NA SOLN
NASAL | Status: DC | PRN
  Administered 2021-02-22: 1

## 2021-02-22 MED ORDER — ROCURONIUM BROMIDE 10 MG/ML (PF) SYRINGE
PREFILLED_SYRINGE | INTRAVENOUS | Status: AC
  Filled 2021-02-22: qty 10

## 2021-02-22 MED ORDER — ONDANSETRON HCL 4 MG/2ML IJ SOLN
INTRAMUSCULAR | Status: AC
  Filled 2021-02-22: qty 2

## 2021-02-22 MED ORDER — MIDAZOLAM HCL 2 MG/2ML IJ SOLN
INTRAMUSCULAR | Status: AC
  Filled 2021-02-22: qty 2

## 2021-02-22 MED ORDER — CHLORHEXIDINE GLUCONATE CLOTH 2 % EX PADS: 6.0000 | MEDICATED_PAD | Freq: Once | CUTANEOUS | Status: DC

## 2021-02-22 MED ORDER — DEXAMETHASONE SODIUM PHOSPHATE 10 MG/ML IJ SOLN
INTRAMUSCULAR | Status: AC
  Filled 2021-02-22: qty 1

## 2021-02-22 MED ORDER — LIDOCAINE-EPINEPHRINE 1 %-1:100000 IJ SOLN
INTRAMUSCULAR | Status: DC | PRN
  Administered 2021-02-22: 12 mL

## 2021-02-22 MED ORDER — DEXAMETHASONE SODIUM PHOSPHATE 10 MG/ML IJ SOLN
INTRAMUSCULAR | Status: DC | PRN
  Administered 2021-02-22: 5 mg via INTRAVENOUS

## 2021-02-22 MED ORDER — LACTATED RINGERS IV SOLN: INTRAVENOUS | Status: DC

## 2021-02-22 MED ORDER — FENTANYL CITRATE (PF) 250 MCG/5ML IJ SOLN
INTRAMUSCULAR | Status: DC | PRN
  Administered 2021-02-22: 150 ug via INTRAVENOUS

## 2021-02-22 MED ORDER — ALBUTEROL SULFATE (2.5 MG/3ML) 0.083% IN NEBU
3.0000 mL | INHALATION_SOLUTION | Freq: Four times a day (QID) | RESPIRATORY_TRACT | Status: DC
  Administered 2021-02-22: 3 mL via RESPIRATORY_TRACT
  Filled 2021-02-22: qty 3

## 2021-02-22 MED ORDER — LIDOCAINE-EPINEPHRINE 1 %-1:100000 IJ SOLN
INTRAMUSCULAR | Status: AC
  Filled 2021-02-22: qty 1

## 2021-02-22 MED ORDER — SCOPOLAMINE 1 MG/3DAYS TD PT72
MEDICATED_PATCH | TRANSDERMAL | Status: DC | PRN
  Administered 2021-02-22: 1 via TRANSDERMAL

## 2021-02-22 MED ORDER — IBUPROFEN 100 MG/5ML PO SUSP
400.0000 mg | Freq: Four times a day (QID) | ORAL | Status: DC | PRN
  Administered 2021-02-22 – 2021-02-23 (×3): 400 mg via ORAL
  Filled 2021-02-22 (×3): qty 20

## 2021-02-22 SURGICAL SUPPLY — 79 items
ADH SKN CLS APL DERMABOND .7 (GAUZE/BANDAGES/DRESSINGS) ×2
ATTRACTOMAT 16X20 MAGNETIC DRP (DRAPES) IMPLANT
BAG COUNTER SPONGE SURGICOUNT (BAG) ×2 IMPLANT
BAG SPNG CNTER NS LX DISP (BAG)
BLADE INF TURB ROT M4 2 5PK (BLADE) IMPLANT
BLADE SURG 15 STRL LF DISP TIS (BLADE) ×2 IMPLANT
BLADE SURG 15 STRL SS (BLADE) ×3
BLADE TRICUT ROTATE M4 4 5PK (BLADE) ×3 IMPLANT
BNDG CONFORM 2 STRL LF (GAUZE/BANDAGES/DRESSINGS) IMPLANT
BNDG GAUZE ELAST 4 BULKY (GAUZE/BANDAGES/DRESSINGS) IMPLANT
CANISTER SUCT 3000ML PPV (MISCELLANEOUS) ×6 IMPLANT
CATH ROBINSON RED A/P 16FR (CATHETERS) IMPLANT
CLEANER TIP ELECTROSURG 2X2 (MISCELLANEOUS) ×3 IMPLANT
CNTNR URN SCR LID CUP LEK RST (MISCELLANEOUS) ×2 IMPLANT
COAGULATOR SUCT 8FR VV (MISCELLANEOUS) ×3 IMPLANT
CONT SPEC 4OZ STRL OR WHT (MISCELLANEOUS) ×3
COVER SURGICAL LIGHT HANDLE (MISCELLANEOUS) ×3 IMPLANT
DECANTER SPIKE VIAL GLASS SM (MISCELLANEOUS) ×3 IMPLANT
DERMABOND ADVANCED (GAUZE/BANDAGES/DRESSINGS) ×1
DERMABOND ADVANCED .7 DNX12 (GAUZE/BANDAGES/DRESSINGS) ×2 IMPLANT
DRAIN PENROSE 1/4X12 LTX STRL (WOUND CARE) IMPLANT
DRAIN SNY 10 ROU (WOUND CARE) IMPLANT
DRAIN SNY 7 FPER (WOUND CARE) IMPLANT
DRAPE HALF SHEET 40X57 (DRAPES) ×1 IMPLANT
DRSG EMULSION OIL 3X3 NADH (GAUZE/BANDAGES/DRESSINGS) IMPLANT
DRSG NASOPORE 8CM (GAUZE/BANDAGES/DRESSINGS) ×2 IMPLANT
DRSG TELFA 3X8 NADH (GAUZE/BANDAGES/DRESSINGS) ×3 IMPLANT
ELECT COATED BLADE 2.86 ST (ELECTRODE) ×3 IMPLANT
ELECT NDL TIP 2.8 STRL (NEEDLE) IMPLANT
ELECT NEEDLE TIP 2.8 STRL (NEEDLE) IMPLANT
ELECT REM PT RETURN 9FT ADLT (ELECTROSURGICAL) ×3
ELECTRODE REM PT RTRN 9FT ADLT (ELECTROSURGICAL) ×2 IMPLANT
GAUZE 4X4 16PLY ~~LOC~~+RFID DBL (SPONGE) IMPLANT
GAUZE SPONGE 2X2 8PLY STRL LF (GAUZE/BANDAGES/DRESSINGS) ×2 IMPLANT
GAUZE SPONGE 4X4 12PLY STRL (GAUZE/BANDAGES/DRESSINGS) IMPLANT
GLOVE SURG LTX SZ7.5 (GLOVE) ×3 IMPLANT
GLOVE SURG MICRO LTX SZ7.5 (GLOVE) ×3 IMPLANT
GOWN STRL REUS W/ TWL LRG LVL3 (GOWN DISPOSABLE) ×4 IMPLANT
GOWN STRL REUS W/ TWL XL LVL3 (GOWN DISPOSABLE) ×2 IMPLANT
GOWN STRL REUS W/TWL LRG LVL3 (GOWN DISPOSABLE) ×6
GOWN STRL REUS W/TWL XL LVL3 (GOWN DISPOSABLE) ×3
KIT BASIN OR (CUSTOM PROCEDURE TRAY) ×3 IMPLANT
KIT TURNOVER KIT B (KITS) ×3 IMPLANT
NDL 25GX 5/8IN NON SAFETY (NEEDLE) IMPLANT
NDL HYPO 25GX1X1/2 BEV (NEEDLE) IMPLANT
NDL PRECISIONGLIDE 27X1.5 (NEEDLE) ×2 IMPLANT
NEEDLE 25GX 5/8IN NON SAFETY (NEEDLE) IMPLANT
NEEDLE HYPO 25GX1X1/2 BEV (NEEDLE) ×3 IMPLANT
NEEDLE PRECISIONGLIDE 27X1.5 (NEEDLE) ×3 IMPLANT
NS IRRIG 1000ML POUR BTL (IV SOLUTION) ×3 IMPLANT
PAD ARMBOARD 7.5X6 YLW CONV (MISCELLANEOUS) ×6 IMPLANT
PAD DRESSING TELFA 3X8 NADH (GAUZE/BANDAGES/DRESSINGS) ×2 IMPLANT
PATTIES SURGICAL .5 X3 (DISPOSABLE) ×3 IMPLANT
PENCIL FOOT CONTROL (ELECTRODE) ×3 IMPLANT
POSITIONER HEAD DONUT 9IN (MISCELLANEOUS) IMPLANT
SOL ANTI FOG 6CC (MISCELLANEOUS) ×2 IMPLANT
SOLUTION ANTI FOG 6CC (MISCELLANEOUS) ×1
SPLINT NASAL DOYLE BI-VL (GAUZE/BANDAGES/DRESSINGS) ×1 IMPLANT
SPONGE GAUZE 2X2 STER 10/PKG (GAUZE/BANDAGES/DRESSINGS) ×1
SUT CHROMIC 3 0 PS 2 (SUTURE) ×2 IMPLANT
SUT CHROMIC 3 0 SH 27 (SUTURE) IMPLANT
SUT CHROMIC 4 0 P 3 18 (SUTURE) ×1 IMPLANT
SUT ETHILON 4 0 PS 2 18 (SUTURE) ×1 IMPLANT
SUT ETHILON 5 0 P 3 18 (SUTURE)
SUT NYLON ETHILON 5-0 P-3 1X18 (SUTURE) IMPLANT
SUT SILK 2 0 PERMA HAND 18 BK (SUTURE) ×2 IMPLANT
SUT SILK 4 0 (SUTURE)
SUT SILK 4-0 18XBRD TIE 12 (SUTURE) ×2 IMPLANT
SUT VIC AB 5-0 P-3 18X BRD (SUTURE) IMPLANT
SUT VIC AB 5-0 P3 18 (SUTURE)
SWAB COLLECTION DEVICE MRSA (MISCELLANEOUS) IMPLANT
SWAB CULTURE ESWAB REG 1ML (MISCELLANEOUS) IMPLANT
SYR BULB IRRIG 60ML STRL (SYRINGE) IMPLANT
SYR TB 1ML LUER SLIP (SYRINGE) IMPLANT
TOWEL GREEN STERILE FF (TOWEL DISPOSABLE) ×3 IMPLANT
TRAY ENT MC OR (CUSTOM PROCEDURE TRAY) ×3 IMPLANT
TUBE CONNECTING 12X1/4 (SUCTIONS) ×3 IMPLANT
WATER STERILE IRR 1000ML POUR (IV SOLUTION) ×3 IMPLANT
YANKAUER SUCT BULB TIP NO VENT (SUCTIONS) IMPLANT

## 2021-02-22 NOTE — Progress Notes (Signed)
Postop check AF VSS Patient awake and alert doing well. Mild bleeding from the nose.  Packing intact. No specific complaints. Stable postop course. Plan on removing nasal packing in the morning and discharging her home.

## 2021-02-22 NOTE — Transfer of Care (Signed)
Immediate Anesthesia Transfer of Care Note  Patient: Brandi Hall  Procedure(s) Performed: NASAL SEPTOPLASTY WITH TURBINATE REDUCTION (Nose) EXCISION RIGHT NASAL POLYPOID MASS (Right: Nose)  Patient Location: PACU  Anesthesia Type:General  Level of Consciousness: drowsy, patient cooperative and responds to stimulation  Airway & Oxygen Therapy: Patient Spontanous Breathing  Post-op Assessment: Report given to RN, Post -op Vital signs reviewed and stable and Patient moving all extremities X 4  Post vital signs: Reviewed and stable  Last Vitals:  Vitals Value Taken Time  BP 121/69 02/22/21 1116  Temp    Pulse 91 02/22/21 1117  Resp 14 02/22/21 1117  SpO2 91 % 02/22/21 1117  Vitals shown include unvalidated device data.  Last Pain:  Vitals:   02/22/21 0747  PainSc: 0-No pain         Complications: No notable events documented.

## 2021-02-22 NOTE — Brief Op Note (Signed)
02/22/2021  11:07 AM  PATIENT:  Brandi Hall  29 y.o. female  PRE-OPERATIVE DIAGNOSIS:  SEPTAL DEVIATION AND NASAL OBSTRUCTION WITH POLYPOID TISSUE ON THE MIDDLE TURBINATES  POST-OPERATIVE DIAGNOSIS:  SEPTAL DEVIATION AND NASAL OBSTRUCTION WITH POLYPOID TISSUE ON THE MIDDLE TURBINATE  PROCEDURE:  Procedure(s): NASAL SEPTOPLASTY WITH TURBINATE REDUCTION (N/A) EXCISION RIGHT NASAL POLYPOID MASS (Right)  SURGEON:  Surgeon(s) and Role:    Drema Halon, MD - Primary  PHYSICIAN ASSISTANT:   ASSISTANTS: none   ANESTHESIA:   general  EBL:  15 mL   BLOOD ADMINISTERED:none  DRAINS: none   LOCAL MEDICATIONS USED:  XYLOCAINE   SPECIMEN:  No Specimen  DISPOSITION OF SPECIMEN:  N/A  COUNTS:  YES  TOURNIQUET:  * No tourniquets in log *  DICTATION: .Other Dictation: Dictation Number [id number]  PLAN OF CARE: Admit for overnight observation  PATIENT DISPOSITION:  PACU - hemodynamically stable.   Delay start of Pharmacological VTE agent (>24hrs) due to surgical blood loss or risk of bleeding: yes

## 2021-02-22 NOTE — Anesthesia Preprocedure Evaluation (Addendum)
Anesthesia Evaluation  Patient identified by MRN, date of birth, ID band Patient awake    Reviewed: Allergy & Precautions, NPO status , Patient's Chart, lab work & pertinent test results  Airway Mallampati: I  TM Distance: >3 FB Neck ROM: Full    Dental no notable dental hx.    Pulmonary asthma , sleep apnea and Continuous Positive Airway Pressure Ventilation ,    Pulmonary exam normal breath sounds clear to auscultation       Cardiovascular negative cardio ROS Normal cardiovascular exam Rhythm:Regular Rate:Normal  ECG: rate 75   Neuro/Psych PSYCHIATRIC DISORDERS Anxiety Depression negative neurological ROS     GI/Hepatic negative GI ROS, Neg liver ROS,   Endo/Other  negative endocrine ROS  Renal/GU negative Renal ROS     Musculoskeletal negative musculoskeletal ROS (+)   Abdominal   Peds  Hematology negative hematology ROS (+)   Anesthesia Other Findings SEPTAL DEVIATION AND NASAL OBSTRUCTION  Reproductive/Obstetrics hcg negative                            Anesthesia Physical Anesthesia Plan  ASA: 2  Anesthesia Plan: General   Post-op Pain Management:    Induction: Intravenous  PONV Risk Score and Plan: 4 or greater and Scopolamine patch - Pre-op, Midazolam, Dexamethasone, Ondansetron and Treatment may vary due to age or medical condition  Airway Management Planned: Oral ETT  Additional Equipment:   Intra-op Plan:   Post-operative Plan: Extubation in OR  Informed Consent: I have reviewed the patients History and Physical, chart, labs and discussed the procedure including the risks, benefits and alternatives for the proposed anesthesia with the patient or authorized representative who has indicated his/her understanding and acceptance.     Dental advisory given  Plan Discussed with: CRNA  Anesthesia Plan Comments: (Scop patch)       Anesthesia Quick Evaluation

## 2021-02-22 NOTE — Anesthesia Postprocedure Evaluation (Signed)
Anesthesia Post Note  Patient: Avilyn E Johndrow  Procedure(s) Performed: NASAL SEPTOPLASTY WITH TURBINATE REDUCTION (Nose) EXCISION RIGHT NASAL POLYPOID MASS (Right: Nose)     Patient location during evaluation: PACU Anesthesia Type: General Level of consciousness: awake Pain management: pain level controlled Vital Signs Assessment: post-procedure vital signs reviewed and stable Respiratory status: spontaneous breathing, nonlabored ventilation, respiratory function stable and patient connected to nasal cannula oxygen Cardiovascular status: blood pressure returned to baseline and stable Postop Assessment: no apparent nausea or vomiting Anesthetic complications: no   No notable events documented.  Last Vitals:  Vitals:   02/22/21 1931 02/22/21 2119  BP:  117/67  Pulse:  82  Resp:  20  Temp:  36.7 C  SpO2: 96% 95%    Last Pain:  Vitals:   02/22/21 2119  TempSrc: Oral  PainSc:                  Catheryn Bacon Justun Anaya

## 2021-02-22 NOTE — Anesthesia Procedure Notes (Signed)
Procedure Name: Intubation Date/Time: 02/22/2021 9:17 AM Performed by: Aundria Rud, CRNA Pre-anesthesia Checklist: Patient identified, Emergency Drugs available, Suction available and Patient being monitored Patient Re-evaluated:Patient Re-evaluated prior to induction Oxygen Delivery Method: Circle System Utilized Preoxygenation: Pre-oxygenation with 100% oxygen Induction Type: IV induction Ventilation: Mask ventilation without difficulty and Oral airway inserted - appropriate to patient size Laryngoscope Size: Hyacinth Meeker and 2 Grade View: Grade I Tube type: Oral Tube size: 7.0 mm Number of attempts: 1 Airway Equipment and Method: Stylet and Oral airway Placement Confirmation: ETT inserted through vocal cords under direct vision, positive ETCO2 and breath sounds checked- equal and bilateral Secured at: 21 cm Tube secured with: Tape Dental Injury: Teeth and Oropharynx as per pre-operative assessment  Comments: Intubated by Jaquelyn Bitter

## 2021-02-22 NOTE — Discharge Instructions (Addendum)
Take antibiotics as prescribed. Tylenol and/or ibuprofen as needed pain. Can use hydrocodone 5 mg tabs as needed more severe pain. Use saline nasal irrigation once or twice a day or as needed. Start tomorrow. Return to see Dr. Ezzard Standing next Wednesday at 11:45 at his office. Call Dr. Ezzard Standing if you have any problems or questions before then.   [phone] May use Afrin or similar decongestant spray prn bleeding or bad nasal congestion.

## 2021-02-22 NOTE — Interval H&P Note (Signed)
History and Physical Interval Note:  02/22/2021 8:34 AM  Brandi Hall  has presented today for surgery, with the diagnosis of SEPTAL DEVIATION AND NASAL OBSTRUCTION.  The various methods of treatment have been discussed with the patient and family. After consideration of risks, benefits and other options for treatment, the patient has consented to  Procedure(s): NASAL SEPTOPLASTY WITH TURBINATE REDUCTION (N/A) EXCISION RIGHT NASAL POLYPOID MASS (Right) as a surgical intervention.  The patient's history has been reviewed, patient examined, no change in status, stable for surgery.  I have reviewed the patient's chart and labs.  Questions were answered to the patient's satisfaction.     Dillard Cannon

## 2021-02-22 NOTE — Op Note (Signed)
NAME: Brandi Hall, Brandi Hall MEDICAL RECORD NO: [id number] ACCOUNT NO: [id number] DATE OF BIRTH: Jul 15, 1990 FACILITY: [facility] LOCATION: [facility] PHYSICIAN: Kristine Garbe. Ezzard Standing, MD  Operative Report   DATE OF PROCEDURE: 02/22/2021  PREOPERATIVE DIAGNOSES:  Septal deviation with nasal obstruction and turbinate hypertrophy.  Right nasal polypoid mass.  POSTOPERATIVE DIAGNOSES:  Septal deviation with nasal obstruction and turbinate hypertrophy.  Right nasal polypoid mass. OPERATION PERFORMED:  Septoplasty with bilateral inferior turbinate reductions and endoscopic removal of right middle turbinate polypoid mass with reduction of right middle turbinate concha bullosa.  SURGEON:  Dillard Cannon, MD  ANESTHESIA:  General endotracheal.  ESTIMATED BLOOD LOSS:  30 mL.  COMPLICATIONS:  None.  BRIEF CLINICAL NOTE:  The patient is a 30 year old female who has had chronic problems with nasal obstruction, especially on the left side.  She also has mild-to-moderate obstructive sleep apnea and has been using a nasal CPAP which but she does not  tolerate well because of nasal obstruction.  On exam in the office, she has a moderate-to-severe septal deviation to the left with a polypoid mass arising from the right middle turbinate on the right side.  She is taken to the operating room at this time  for septoplasty and inferior turbinate reductions along with removal of right intranasal polypoid mass.  DESCRIPTION OF PROCEDURE:  After adequate endotracheal anesthesia, nose was prepped with Betadine solution and was further prepped with cotton pledgets soaked in Afrin and septum, turbinates and right middle turbinate area was injected with Xylocaine  with epinephrine for hemostasis. First, using endoscope, the nose was examined.  Of note, she had some polypoid mass arising from small area on the left middle turbinate and a large polypoid mass on the right middle turbinate along with a very thickened  right  middle turbinate.  The polypoid mass on the right side was removed with a straight Thru-Cut forceps along with the more medial portion of the concha bullosa.  Hemostasis was obtained with suction cautery.  Following this, septoplasty was performed.   A hemitransfixion incision was made along the cartilage of the septum on the right side.  Mucoperichondrial, mucoperiosteal flaps were elevated posteriorly.  The patient had a cartilaginous deviation to the left and a vertical incision was made through  the cartilaginous septum at approximately area of the deviation.  Then, the mucoperichondrium was elevated off the cartilaginous septum on either side posterior to this vertical incision.  Vertical incision was just anterior to the middle turbinate on  the left side.  Then, cartilaginous septum and bony septum protruded to the left side was removed after elevating mucoperichondrial, mucoperiosteal flaps on either side of this.  This allowed the septum to return much better toward midline.  Now, the  left middle turbinate could be better visualized and some of the left middle turbinate also had some polypoid tissue that was removed with a small Thru-Cut forceps and it was cauterized using suction cautery.  This completed the septoplasty portion of  procedure and removal of the polypoid tissue from the middle turbinate on the right side.  Next, inferior turbinate reductions were performed with turbinate blade. Submucosal turbinate tissue was removed from both inferior turbinates.  The remaining  turbinate bone was outfractured and suction cautery was used for hemostasis and cauterized the posterior and inferior turbinate on both sides.  This completed the procedure.  Splints were secured to either side of the septum with a 4-0 nylon suture.  The  hemitransfixion incision was closed with interrupted 5-0 chromic  sutures x2 and the septum was basted with 3-0 chromic suture prior to placing the splints.  Nose was packed  with Telfa soaked in mupirocin ointment on both sides.  This completed the  procedure.  The patient was awoken from anesthesia and transferred to recovery room postop doing well.  DISPOSITION:  Because of the patient's history of sleep apnea, she will be observed overnight in the hospital.  We will plan on removing the nasal packing tomorrow morning and discharge her home.  We will have her follow up in my office in one week for  recheck and removal of septal splints.   SHW D: 02/22/2021 11:16:13 am T: 02/22/2021 11:47:00 am  JOB: [id number]/ [id number]

## 2021-02-22 NOTE — Plan of Care (Signed)

## 2021-02-23 ENCOUNTER — Encounter (HOSPITAL_COMMUNITY): Payer: Self-pay | Admitting: Otolaryngology

## 2021-02-23 DIAGNOSIS — J342 Deviated nasal septum: Secondary | ICD-10-CM | POA: Diagnosis not present

## 2021-02-23 DIAGNOSIS — J343 Hypertrophy of nasal turbinates: Secondary | ICD-10-CM | POA: Diagnosis not present

## 2021-02-23 MED ORDER — CEPHALEXIN 500 MG PO CAPS: 500.0000 mg | ORAL_CAPSULE | Freq: Two times a day (BID) | ORAL | 0 refills | Status: DC

## 2021-02-23 MED ORDER — HYDROCODONE-ACETAMINOPHEN 5-325 MG PO TABS: 1.0000 | ORAL_TABLET | Freq: Four times a day (QID) | ORAL | 0 refills | Status: DC | PRN

## 2021-02-23 MED ORDER — ALBUTEROL SULFATE (2.5 MG/3ML) 0.083% IN NEBU: 3.0000 mL | INHALATION_SOLUTION | Freq: Four times a day (QID) | RESPIRATORY_TRACT | Status: DC | PRN

## 2021-02-23 NOTE — Progress Notes (Signed)
Discharge instructions explained and given to patient. IV removed. Patient dressed herself and declined assistance off the unit. Patient states her ride is out front waiting for her to come down.

## 2021-02-23 NOTE — Discharge Summary (Signed)
NAME: Brandi Hall, Brandi Hall MEDICAL RECORD NO: [id number] ACCOUNT NO: [id number] DATE OF BIRTH: 07/06/1990 FACILITY: [facility] LOCATION: [facility] PHYSICIAN: Kristine Garbe. Ezzard Standing, MD  Discharge Summary   DATE OF DISCHARGE: 02/23/2021  DIAGNOSES: 1.  Nasal obstruction secondary to septal deviation, turbinate hypertrophy and nasal polypoid tissue. 2.  Obstructive sleep apnea.  The patient's operations during this hospitalization would be septoplasty, bilateral inferior turbinate reductions and removal of intranasal polyps, performed on 02/22/2021.  HOSPITAL COURSE:  The patient was admitted via the operating room on 02/22/2021, at which time she underwent a septoplasty, turbinate reductions and removal of intranasal polyps because of severe nasal obstruction.  Because of her history of obstructive  sleep apnea and difficulty using nasal CPAP and postoperative nasal packing required, she is admitted for overnight observation.  She tolerated the procedure well and was admitted for overnight observation and had no breathing issues overnight.  She  received perioperative Ancef 1 gram IV q.8 hours following surgery.  Her nasal packing was removed on the morning of 02/23/2021, with mild bleeding and the patient was subsequently discharged home.  She was doing well at time of discharge.  DISPOSITION:  The patient is discharged home on Keflex 500 mg b.i.d. for [redacted] week along with Tylenol, ibuprofen and hydrocodone p.r.n. pain.  She will follow up in my office in one week for recheck and to have septal splints removed.   SHW D: 02/23/2021 7:58:57 am T: 02/23/2021 10:01:00 am  JOB: [id number]/ [id number]

## 2021-02-23 NOTE — Progress Notes (Signed)
POD 1 Afebrile vital signs stable Patient did well last night with no significant respiratory problems.. Nasal packing was removed this morning with minimal bleeding. She is subsequent discharged home. Discharge medications include her regular medications.  Also Keflex 500 mg twice daily for the next week.  Tylenol, ibuprofen and/or hydrocodone 5 mg tabs every 6 hours as needed pain. She will follow-up in my office in 1 week to have the nasal septal splints removed.  Discharge dictated #[id number]

## 2021-03-01 ENCOUNTER — Encounter: Payer: Self-pay | Admitting: Neurology

## 2021-03-01 ENCOUNTER — Ambulatory Visit (INDEPENDENT_AMBULATORY_CARE_PROVIDER_SITE_OTHER): Payer: BC Managed Care – PPO | Admitting: Neurology

## 2021-03-01 VITALS — BP 125/78 | HR 93 | Ht 66.0 in | Wt 177.5 lb

## 2021-03-01 DIAGNOSIS — R9089 Other abnormal findings on diagnostic imaging of central nervous system: Secondary | ICD-10-CM | POA: Insufficient documentation

## 2021-03-01 DIAGNOSIS — G4733 Obstructive sleep apnea (adult) (pediatric): Secondary | ICD-10-CM | POA: Diagnosis not present

## 2021-03-01 DIAGNOSIS — H814 Vertigo of central origin: Secondary | ICD-10-CM | POA: Diagnosis not present

## 2021-03-01 DIAGNOSIS — J342 Deviated nasal septum: Secondary | ICD-10-CM

## 2021-03-01 DIAGNOSIS — Z9989 Dependence on other enabling machines and devices: Secondary | ICD-10-CM

## 2021-03-01 MED ORDER — ALPRAZOLAM 0.25 MG PO TABS: 0.2500 mg | ORAL_TABLET | Freq: Every evening | ORAL | 0 refills | Status: DC | PRN

## 2021-03-01 NOTE — Progress Notes (Signed)
SLEEP MED[facility]INE CL[facility]IC    Provider:  Larey Seat, MD  Primary Care Physician:  Colon Branch, [address] [place] [address]     Referring Provider: Colon Branch, [address] [place],  [place] [address]          Chief Complaint according to patient   Patient presents with:     New Patient (Initial Visit)     Internal referral for abnormal brain MRI. Pt went to the ER for dizziness. On 12/01/20. Dizziness from possible positional vertigo. BP has been normal. Pt taking meclizine. Pt had nasal nasalpasty on 96 and has not been able to use cpap recenlty      HISTORY OF PRESENT ILLNESS:  Brandi Hall is a 30 y.o. Caucasian female patient and  seen upon a consultation request / referral on 03/01/2021 from Dr. Larose Kells.   Internal referral for abnormal brain MRI. Pt went to the ER for dizziness. Vertigo- feeling as if she would tumble over, forward, not a spinning -rotating sensation.  ED On 12/01/20. Dizziness from possible positional vertigo. BP has been normal. Pt taking meclizine. Pt had nasal nasalpasty on 96 and has not been able to use CPAP recenltey. I had tested the patient for narcolepsy, HLA was negative, but sleep study had demonstrated OSA and she has been using auto pap for 6-16 cm water,  AHI is 0.2/h ! 95%6.1 cm water, no air leak.  She has been using CPAP with a 85% compliance.  [name] had undergone a nasal septal plasty on 6 September and she is now rather congested.  I advised her not to use the CPAP for another 10 to 14 days until the swelling has come down.  She can use Afrin but I rather have her use a nondecongestant nasal spray so that there is no rebound effect.  The rebound effect is based on the vasoconstricting mechanisms and decongestants but also can lead to high blood pressure peaks.  They may also cause some dizziness or even pulsatile tinnitus tinnitus.  So she was seen in the emergency room for dizziness just in June  and her CT of the head was a normal noncontrast CT it was followed by an MRI of the which was read as showing no acute infarct but multifocal hyperintense T2 weighted signal within the white matter.  No atrophy was noted this would not be typical for her age to present with chronic microvascular ischemia.  He can see that with advanced diabetes or a longstanding history of vascular headaches and migraines.  So her primary care physician asked her also to see me because of these spells.        Chief concern according to patient : hypersomnia:    I have the pleasure of seeing Brandi Hall toda 06-30-2021, a right -handed female with a reported history of hypersomnia, onset over 10 years ago. She started drinking coffee at age 89 to combat  Asthma, and sleepiness.  [name] also reports that she was excessively sleepy when she couldn't drink caffeine and she named her pregnancies as an example.  She endorsed the Epworth sleepiness score at a very high rate of 19 out of 24 possible points, and stated that she is always taking naps and feels that she needs to sleep more.  She always feels tired. She had Covid 19 in February in 2021- and she was sick, headaches, and  fatigue.  Her whole family got it. She has been told that she snores she has suffered from anxiety if there is also some joint pain that may contribute to some discomfort overnight and the level of decreased energy in daytime. She takes 1-2 hours naps, not power naps.  She dreams even in naps, has had sleep paralysis, dreams are very vivid , terrifying her. She sleep talks. She is restless, moves frequently in sleep.  She  has a past medical history of Allergic rhinitis, Anxiety,  Allergic Asthma, Lymph node abscess, Post term pregnancy, 41 weeks (12/18/2014), and Postpartum care following vaginal delivery (7/1) (12/18/2014).     Sleep relevant medical history: allergic rhinitis and asthma, hypersomnia, excessive daytime sleepiness and vivid  dreams. Cataplectic attacks, knees buckle. Dream intrusions.     Family medical /sleep history: MGF on CPAP with OSA, mother and sister have hypersomnia.     Social history: Patient is working as a Medical laboratory scientific officer- and lives in a household with husband and 2 daughters, and one cat.  . Tobacco use; none .  ETOH use ; none ,  Caffeine intake in form of Coffee(2 cups in AM and cold brew  2 cups in PM) Soda( /). Regular exercise in form of dancing       Sleep habits are as follows: The patient's dinner time is between 7-8 PM. The patient goes to bed at 9 PM and continues to sleep for 8-9 hours, she does not wakes for  bathroom breaks. The preferred sleep position is in fetal position, with the support of 1-2 pillows. Dreams are reportedly frequent/vivid  8 AM is the usual rise time.The patient wakes up with great difficulty, she sleeps through multiple alarms.  She reports not feeling refreshed or restored in AM, with symptoms such as dry mouth morning headaches, and residual fatigue. Naps are taken frequently, lasting 1-2 hours.     Review of Systems: Out of a complete 14 system review, the patient complains of only the following symptoms, and all other reviewed systems are negative.:  Fatigue, sleepiness , snoring, vivid dreams, sleep paralysis, cataplectic attacks.  How likely are you to doze in the following situations: 0 = not likely, 1 = slight chance, 2 = moderate chance, 3 = high chance   Sitting and Reading? Watching Television? Sitting inactive in a public place (theater or meeting)? As a passenger in a car for an hour without a break? Lying down in the afternoon when circumstances permit? Sitting and talking to someone? Sitting quietly after lunch without alcohol? In a car, while stopped for a few minutes in traffic?   Total = 12/ 24 points   FSS endorsed at 63/ 63 points.    My nurse today undertook the effort to do an orthostatic blood pressure measurement in a lying position  the patient had 128/78 mmHg blood pressure with a heart rate of 91 she felt actually slightly dizzy just lying down.  In a seated position the blood pressure was only attached at 230/84 and her heart rate was reduced initially 90 bpm regular but she felt slightly dizzy.  Standing blood pressure increased further 236/84 heart rate increased significantly 216 and she again had a slight dizziness after 3 minutes of standing blood pressure reduced 222/86 and heart rate went back to 99 she still felt dizzy but these values would be considered in normal range.      Social History   Socioeconomic History   Marital status: Married  Spouse name: marc   Number of children: 2   Years of education: Not on file   Highest education level: Bachelor's degree (e.g., BA, AB, BS)  Occupational History   Occupation: finished college, works-- Medical laboratory scientific officer  Tobacco Use   Smoking status: Never   Smokeless tobacco: Never  Vaping Use   Vaping Use: Never used  Substance and Sexual Activity   Alcohol use: Yes    Alcohol/week: 0.0 standard drinks    Comment: rarely   Drug use: No   Sexual activity: Not on file  Other Topics Concern   Not on file  Social History Narrative   Live with husband 2 daughter - 2016 , 2020   Right handed   Caffeine: 3 cups of coffee a day and 1 tea or soda a day   Social Determinants of Health   Financial Resource Strain: Not on file  Food Insecurity: Not on file  Transportation Needs: Not on file  Physical Activity: Not on file  Stress: Not on file  Social Connections: Not on file    Family History  Problem Relation Age of Onset   Breast cancer Mother        dx age late 52s   Asthma Father    Uterine cancer Maternal Aunt    Colon cancer Other        Colon   Diabetes Other        GGF   Coronary artery disease Other    Congestive Heart Failure Other     Past Medical History:  Diagnosis Date   Allergic rhinitis    Anxiety    Asthma    Bronchitis    Lymph  node abscess    hosp for infected lymph node (Salmonella) at 30 y/o   PCOS (polycystic ovarian syndrome)    Pneumonia    Post partum depression    Post term pregnancy, 41 weeks 12/18/2014   Postpartum care following vaginal delivery (7/1) 12/18/2014   Sleep apnea    CPAP @ [facility]    Past Surgical History:  Procedure Laterality Date   LAD bx     MASS EXCISION Right 02/22/2021   Procedure: EXCISION RIGHT NASAL POLYPOID MASS;  Surgeon: Rozetta Nunnery, MD;  Location: [facility];  Service: ENT;  Laterality: Right;   NASAL SEPTOPLASTY W/ TURBINOPLASTY N/A 02/22/2021   Procedure: NASAL SEPTOPLASTY WITH TURBINATE REDUCTION;  Surgeon: Rozetta Nunnery, MD;  Location: [facility];  Service: ENT;  Laterality: N/A;   WISDOM TOOTH EXTRACTION       Current Outpatient Medications on File Prior to Visit  Medication Sig Dispense Refill   acetaminophen (TYLENOL) 500 MG tablet Take 1,000 mg by mouth every 6 (six) hours as needed for mild pain.     albuterol (VENTOLIN HFA) 108 (90 Base) MCG/ACT inhaler INHALE TWO PUFFS BY MOUTH EVERY 6 HOURS AS NEEDED FOR WHEEZING OR FOR SHORTNESS OF BREATH 18 g 5   ASHWAGANDHA PO Take 1 capsule by mouth daily.     cephALEXin (KEFLEX) 500 MG capsule Take 1 capsule (500 mg total) by mouth 2 (two) times daily. 14 capsule 0   Lactobacillus (PROBIOTIC ACIDOPHILUS PO) Take 1 capsule by mouth daily.     MAGNESIUM CITRATE PO Take 1 tablet by mouth daily.     Norgestimate-Ethinyl Estradiol Triphasic 0.18/0.215/0.25 MG-25 MCG tab Take 1 tablet by mouth daily.     Nutritional Supplements (JUICE PLUS FIBRE PO) Take 3 capsules by mouth daily. Veggie  OVER THE COUNTER MEDICATION Take 4 capsules by mouth daily. Myo-inositol     Tetrahydrozoline-Zn Sulfate (EYE DROPS ALLERGY RELIEF OP) Place 1 drop into both eyes daily as needed (allergy).     triamcinolone (NASACORT) 55 MCG/ACT AERO nasal inhaler Place 2 sprays into the nose daily. 2 sprays each nostril at night. (Patient taking  differently: Place 2 sprays into the nose daily as needed (allergies).) 1 each 12   No current facility-administered medications on file prior to visit.    Allergies  Allergen Reactions   Gluten Meal Diarrhea   Soy Allergy Nausea And Vomiting   Wound Dressing Adhesive Rash    Glue on Bandaids    Physical exam:  Today's Vitals   03/01/21 1422  BP: 125/78  Pulse: 93  Weight: 177 lb 8 oz (80.5 kg)  Height: '5\' 6"'  (1.676 m)   Body mass index is 28.65 kg/m.   Wt Readings from Last 3 Encounters:  03/01/21 177 lb 8 oz (80.5 kg)  02/22/21 175 lb (79.4 kg)  02/16/21 174 lb 12.8 oz (79.3 kg)     Ht Readings from Last 3 Encounters:  03/01/21 '5\' 6"'  (1.676 m)  02/22/21 '5\' 6"'  (1.676 m)  02/16/21 '5\' 6"'  (1.676 m)      General: The patient is awake, alert and appears not in acute distress. The patient is well groomed. Head: Normocephalic, atraumatic. Neck is supple.  Mallampati 2,  neck circumference: 14 inches . Nasal airflow patent only on the left nasion- septal deviation.    Retrognathia is seen. Crowded teeth, wore an expanding brace Dental status: native.  Cardiovascular:  Regular rate and cardiac rhythm by pulse,  without distended neck veins. Respiratory: Lungs are clear to auscultation.  Skin:  Without evidence of ankle edema, or rash. Trunk: The patient's posture is erect.   Neurologic exam : The patient is awake and alert, oriented to place and time.   Memory subjective described as intact.  Attention span & concentration ability appears normal.  Speech is fluent,  without  dysarthria, dysphonia or aphasia.  Mood and affect are appropriate.   Cranial nerves: no loss of smell or taste reported  Pupils are equal and briskly reactive to light. Funduscopic exam  deferred.  Extraocular movements in vertical and horizontal planes were intact and without nystagmus. No Diplopia. Visual fields by finger perimetry are intact. Hearing was intact to soft voice and finger  rubbing.    Facial sensation intact to fine touch.  Facial motor strength is symmetric and tongue and uvula move midline.  Neck ROM : rotation, tilt and flexion extension were normal for age and shoulder shrug was symmetrical.    Motor exam:  Symmetric bulk, tone and ROM.   Normal tone without cog wheeling, symmetric grip strength .   Sensory:  Fine touch, pinprick and vibration were tested  and  normal.  Proprioception tested in the upper extremities was normal.   Coordination: Rapid alternating movements in the fingers/hands were of normal speed.  The Finger-to-nose maneuver was intact without evidence of ataxia, dysmetria or tremor.   Gait and station: Patient could rise unassisted from a seated position, walked without assistive device.  Stance is of normal width/ base and the patient turned with 3 steps.  Toe and heel walk were deferred.  Deep tendon reflexes: in the  upper and lower extremities are symmetric and intact.  Babinski response was deferred.        After spending a total time of  45  minutes face to face and additional time for physical and neurologic examination, review of laboratory studies,  personal review of imaging studies, reports and results of other testing and review of referral information / records as far as provided in visit, I have established the following assessments:  CLINICAL DATA:  Dizziness   EXAM: MRI HEAD WITHOUT CONTRAST   TECHNIQUE: Multiplanar, multiecho pulse sequences of the brain and surrounding structures were obtained without intravenous contrast.   COMPARISON:  None.   FINDINGS: Brain: No acute infarct, mass effect or extra-axial collection. No acute or chronic hemorrhage. There is multifocal hyperintense T2-weighted signal within the white matter. Parenchymal volume and CSF spaces are normal. The midline structures are normal.   Vascular: Major flow voids are preserved.   Skull and upper cervical spine: Normal calvarium and  skull base. Visualized upper cervical spine and soft tissues are normal.   Sinuses/Orbits:No paranasal sinus fluid levels or advanced mucosal thickening. Right mastoid effusion. Normal orbits.   IMPRESSION: 1. No acute intracranial abnormality. 2. Multifocal hyperintense T2-weighted signal within the white matter, most commonly seen in the setting of chronic microvascular ischemia.      1)  Patient with dizziness spells.    My nurse today undertook the effort to do an orthostatic blood pressure measurement in a lying position the patient had 128/78 mmHg blood pressure with a heart rate of 91 she felt actually slightly dizzy just lying down.  In a seated position the blood pressure was only attached at 230/84 and her heart rate was reduced initially 90 bpm regular but she felt slightly dizzy.  Standing blood pressure increased further 236/84 heart rate increased significantly 216 and she again had a slight dizziness after 3 minutes of standing blood pressure reduced 222/86 and heart rate went back to 99 she still felt dizzy but these values would be considered in normal range. Tinnitus in right ear, vertigo in 11-2020. Ed note and images reviewed.   2)  HLA was negative, but sleep study had demonstrated OSA and she has been using auto pap for 6-16 cm water,  AHI is 0.2/h ! 95%6.1 cm water, no air leak.  She has been using CPAP with a 85% compliance.  [name] had undergone a nasal septal plasty on 6 September and she is now rather congested.  I advised her not to use the CPAP for another 10 to 14 days until the swelling has come down.  She can use Afrin but I rather have her use a nondecongestant nasal spray so that there is no rebound effect.  The rebound effect is based on the vasoconstricting mechanisms and decongestants but also can lead to high blood pressure peaks.   I would like to thank Colon Branch, MD and Colon Branch, [address] [place],  [place] [address] for  allowing me to meet with and to take care of this pleasant patient.   The patient has been recovering form recent septoplasty, not allowed to use CPAP until swelling subsides.  In June she had 3 days of very short spells a minute and a half with the longest of abnormal eye movements giving her a sensation of vertigo.  This was the reason that she presented to the emergency room at that this MRI was obtained.  I applaud that that MRI was obtained but unfortunately without contrast.  What I would like to see is that she is trying to do the Epley maneuver at home as  much as possible and tolerated, I will be happy to provide her with an antinausea medication should she have trouble trouble to recover from the maneuvers effect.   Xanax 0. 5 mg prn .   And then I would like to meet again in about 4-5 months and we do a new evaluation when she has returned back on CPAP and when we have normal nasal airflow hopefully restored, and at that time we can decide if we want another MRI at that time with contrast in about 12 months from now or if her symptoms are subsiding and staying away I do not necessarily need to follow-up.  I think there is evidence of a possible vasculitis but this is scarred over to this is a long-term very small scar that has been distributed over both hemispheres of the brain, it does not bear  a resemblance of a demyelinating disease. I plan to follow up either personally or through our NP within 4-5 month.   CC: I will share my notes with PCP.   Electronically signed by: Larey Seat, MD 03/01/2021 2:47 PM  [facility] Board certified by The [employer] of Sleep Medicine and Diplomate of the [employer] of Sleep Medicine. Board certified In Neurology through the [employer], Fellow of the [employer] of Neurology. Medical Director of [employer].

## 2021-03-01 NOTE — Patient Instructions (Signed)
How to Perform the Epley Maneuver The Epley maneuver is an exercise that relieves symptoms of vertigo. Vertigo is the feeling that you or your surroundings are moving when they are not. When you feel vertigo, you may feel like the room is spinning and may have trouble walking. The Epley maneuver is used for a type of vertigo caused by a calcium deposit in a part of the inner ear. The maneuver involves changing headpositions to help the deposit move out of the area. You can do this maneuver at home whenever you have symptoms of vertigo. You canrepeat it in 24 hours if your vertigo has not gone away. Even though the Epley maneuver may relieve your vertigo for a few weeks, it is possible that your symptoms will return. This maneuver relieves vertigo, but itdoes not relieve dizziness. What are the risks? If it is done correctly, the Epley maneuver is considered safe. Sometimes it can lead to dizziness or nausea that goes away after a short time. If you develop other symptoms--such as changes in vision, weakness, or numbness--stopdoing the maneuver and call your health care provider. Supplies needed: A bed or table. A pillow. How to do the Epley maneuver     Sit on the edge of a bed or table with your back straight and your legs extended or hanging over the edge of the bed or table. Turn your head halfway toward the affected ear or side as told by your health care provider. Lie backward quickly with your head turned until you are lying flat on your back. Your head should dangle (head-hanging position). You may want to position a pillow under your shoulders. Hold this position for at least 30 seconds. If you feel dizzy or have symptoms of vertigo, continue to hold the position until the symptoms stop. Turn your head to the opposite direction until your unaffected ear is facing down. Your head should continue to dangle. Hold this position for at least 30 seconds. If you feel dizzy or have symptoms of  vertigo, continue to hold the position until the symptoms stop. Turn your whole body to the same side as your head so that you are positioned on your side. Your head will now be nearly facedown and no longer needs to dangle. Hold for at least 30 seconds. If you feel dizzy or have symptoms of vertigo, continue to hold the position until the symptoms stop. Sit back up. You can repeat the maneuver in 24 hours if your vertigo does not go away. Follow these instructions at home: For 24 hours after doing the Epley maneuver: Keep your head in an upright position. When lying down to sleep or rest, keep your head raised (elevated) with two or more pillows. Avoid excessive neck movements. Activity Do not drive or use machinery if you feel dizzy. After doing the Epley maneuver, return to your normal activities as told by your health care provider. Ask your health care provider what activities are safe for you. General instructions Drink enough fluid to keep your urine pale yellow. Do not drink alcohol. Take over-the-counter and prescription medicines only as told by your health care provider. Keep all follow-up visits. This is important. Preventing vertigo symptoms Ask your health care provider if there is anything you should do at home to prevent vertigo. He or she may recommend that you: Keep your head elevated with two or more pillows while you sleep. Do not sleep on the side of your affected ear. Get up slowly from bed.   Avoid sudden movements during the day. Avoid extreme head positions or movement, such as looking up or bending over. Contact a health care provider if: Your vertigo gets worse. You have other symptoms, including: Nausea. Vomiting. Headache. Get help right away if you: Have vision changes. Have a headache or neck pain that is severe or getting worse. Cannot stop vomiting. Have new numbness or weakness in any part of your body. These symptoms may represent a serious problem  that is an emergency. Do not wait to see if the symptoms will go away. Get medical help right away. Call your local emergency services (911 in the [place] Do not drive yourself to the hospital. Summary Vertigo is the feeling that you or your surroundings are moving when they are not. The Epley maneuver is an exercise that relieves symptoms of vertigo. If the Epley maneuver is done correctly, it is considered safe. This information is not intended to replace advice given to you by your health care provider. Make sure you discuss any questions you have with your healthcare provider. Document Revised: 05/05/2020 Document Reviewed: 05/05/2020 Elsevier Patient Education  2022 [employer]  

## 2021-03-02 ENCOUNTER — Other Ambulatory Visit: Payer: Self-pay

## 2021-03-02 ENCOUNTER — Ambulatory Visit (INDEPENDENT_AMBULATORY_CARE_PROVIDER_SITE_OTHER): Payer: BC Managed Care – PPO | Admitting: Otolaryngology

## 2021-03-02 DIAGNOSIS — Z4889 Encounter for other specified surgical aftercare: Secondary | ICD-10-CM

## 2021-03-02 NOTE — Progress Notes (Signed)
HPI: Brandi Hall is a 30 y.o. female who presents 8 days s/p septoplasty, turbinate reductions and removal of right nasal cavity polypoid mass from right middle turbinate.  She is doing well using the saline rinses..   Past Medical History:  Diagnosis Date   Allergic rhinitis    Anxiety    Asthma    Bronchitis    Lymph node abscess    hosp for infected lymph node (Salmonella) at 30 y/o   PCOS (polycystic ovarian syndrome)    Pneumonia    Post partum depression    Post term pregnancy, 41 weeks 12/18/2014   Postpartum care following vaginal delivery (7/1) 12/18/2014   Sleep apnea    CPAP @ [facility]   Past Surgical History:  Procedure Laterality Date   LAD bx     MASS EXCISION Right 02/22/2021   Procedure: EXCISION RIGHT NASAL POLYPOID MASS;  Surgeon: Drema Halon, MD;  Location: [facility] OR;  Service: ENT;  Laterality: Right;   NASAL SEPTOPLASTY W/ TURBINOPLASTY N/A 02/22/2021   Procedure: NASAL SEPTOPLASTY WITH TURBINATE REDUCTION;  Surgeon: Drema Halon, MD;  Location: [facility] OR;  Service: ENT;  Laterality: N/A;   WISDOM TOOTH EXTRACTION     Social History   Socioeconomic History   Marital status: Married    Spouse name: marc   Number of children: 2   Years of education: Not on file   Highest education level: Bachelor's degree (e.g., BA, AB, BS)  Occupational History   Occupation: finished college, works-- Tourist information centre manager  Tobacco Use   Smoking status: Never   Smokeless tobacco: Never  Vaping Use   Vaping Use: Never used  Substance and Sexual Activity   Alcohol use: Yes    Alcohol/week: 0.0 standard drinks    Comment: rarely   Drug use: No   Sexual activity: Not on file  Other Topics Concern   Not on file  Social History Narrative   Live with husband 2 daughter - 2016 , 2020   Right handed   Caffeine: 3 cups of coffee a day and 1 tea or soda a day   Social Determinants of Health   Financial Resource Strain: Not on file  Food Insecurity: Not on file   Transportation Needs: Not on file  Physical Activity: Not on file  Stress: Not on file  Social Connections: Not on file   Family History  Problem Relation Age of Onset   Breast cancer Mother        dx age late 51s   Asthma Father    Uterine cancer Maternal Aunt    Colon cancer Other        Colon   Diabetes Other        GGF   Coronary artery disease Other    Congestive Heart Failure Other    Allergies  Allergen Reactions   Gluten Meal Diarrhea   Soy Allergy Nausea And Vomiting   Wound Dressing Adhesive Rash    Glue on Bandaids   Prior to Admission medications   Medication Sig Start Date End Date Taking? Authorizing Provider  acetaminophen (TYLENOL) 500 MG tablet Take 1,000 mg by mouth every 6 (six) hours as needed for mild pain.    [provider]  albuterol (VENTOLIN HFA) 108 (90 Base) MCG/ACT inhaler INHALE TWO PUFFS BY MOUTH EVERY 6 HOURS AS NEEDED FOR WHEEZING OR FOR SHORTNESS OF BREATH 02/22/21   Wanda Plump, MD  ALPRAZolam Prudy Feeler) 0.25 MG tablet Take 1 tablet (0.25  mg total) by mouth at bedtime as needed for anxiety. 03/01/21   Dohmeier, Porfirio Mylar, MD  ASHWAGANDHA PO Take 1 capsule by mouth daily.    [provider]  cephALEXin (KEFLEX) 500 MG capsule Take 1 capsule (500 mg total) by mouth 2 (two) times daily. 02/23/21   Drema Halon, MD  Lactobacillus (PROBIOTIC ACIDOPHILUS PO) Take 1 capsule by mouth daily.    [provider]  MAGNESIUM CITRATE PO Take 1 tablet by mouth daily.    [provider]  Norgestimate-Ethinyl Estradiol Triphasic 0.18/0.215/0.25 MG-25 MCG tab Take 1 tablet by mouth daily.    [provider]  Nutritional Supplements (JUICE PLUS FIBRE PO) Take 3 capsules by mouth daily. Veggie    [provider]  OVER THE COUNTER MEDICATION Take 4 capsules by mouth daily. Myo-inositol    [provider]  Tetrahydrozoline-Zn Sulfate (EYE DROPS ALLERGY RELIEF OP) Place 1 drop into both eyes daily as  needed (allergy).    [provider]  triamcinolone (NASACORT) 55 MCG/ACT AERO nasal inhaler Place 2 sprays into the nose daily. 2 sprays each nostril at night. Patient taking differently: Place 2 sprays into the nose daily as needed (allergies). 12/15/20   Drema Halon, MD     Physical Exam: Splints were removed from the nasal cavity on both sides.  She had minimal crusting along the inferior turbinates.  Breathing was much better.   Assessment: S/p septoplasty, turbinate reductions and removal of right nasal cavity polypoid mass.  Plan: She will continue with the saline rinses and follow-up in 2 to 3 weeks for recheck and cleaning nose.   Narda Bonds, MD

## 2021-03-07 ENCOUNTER — Ambulatory Visit: Payer: Self-pay | Admitting: Family Medicine

## 2021-03-19 DIAGNOSIS — G4733 Obstructive sleep apnea (adult) (pediatric): Secondary | ICD-10-CM | POA: Diagnosis not present

## 2021-03-23 ENCOUNTER — Other Ambulatory Visit: Payer: Self-pay

## 2021-03-23 ENCOUNTER — Ambulatory Visit (INDEPENDENT_AMBULATORY_CARE_PROVIDER_SITE_OTHER): Payer: BC Managed Care – PPO | Admitting: Otolaryngology

## 2021-03-23 ENCOUNTER — Encounter (INDEPENDENT_AMBULATORY_CARE_PROVIDER_SITE_OTHER): Payer: Self-pay

## 2021-03-23 DIAGNOSIS — Z4889 Encounter for other specified surgical aftercare: Secondary | ICD-10-CM

## 2021-03-23 NOTE — Progress Notes (Signed)
HPI: Brandi Hall is a 30 y.o. female who presents 1 month s/p septoplasty and turbinate reductions with a reduction of right middle turbinate concha bullosa removal polypoid disease.  She is doing better and breathing much better..   Past Medical History:  Diagnosis Date   Allergic rhinitis    Anxiety    Asthma    Bronchitis    Lymph node abscess    hosp for infected lymph node (Salmonella) at 30 y/o   PCOS (polycystic ovarian syndrome)    Pneumonia    Post partum depression    Post term pregnancy, 41 weeks 12/18/2014   Postpartum care following vaginal delivery (7/1) 12/18/2014   Sleep apnea    CPAP @ HS   Past Surgical History:  Procedure Laterality Date   LAD bx     MASS EXCISION Right 02/22/2021   Procedure: EXCISION RIGHT NASAL POLYPOID MASS;  Surgeon: Drema Halon, MD;  Location: [facility] OR;  Service: ENT;  Laterality: Right;   NASAL SEPTOPLASTY W/ TURBINOPLASTY N/A 02/22/2021   Procedure: NASAL SEPTOPLASTY WITH TURBINATE REDUCTION;  Surgeon: Drema Halon, MD;  Location: [facility] OR;  Service: ENT;  Laterality: N/A;   WISDOM TOOTH EXTRACTION     Social History   Socioeconomic History   Marital status: Married    Spouse name: marc   Number of children: 2   Years of education: Not on file   Highest education level: Bachelor's degree (e.g., BA, AB, BS)  Occupational History   Occupation: finished college, works-- Tourist information centre manager  Tobacco Use   Smoking status: Never   Smokeless tobacco: Never  Vaping Use   Vaping Use: Never used  Substance and Sexual Activity   Alcohol use: Yes    Alcohol/week: 0.0 standard drinks    Comment: rarely   Drug use: No   Sexual activity: Not on file  Other Topics Concern   Not on file  Social History Narrative   Live with husband 2 daughter - 2016 , 2020   Right handed   Caffeine: 3 cups of coffee a day and 1 tea or soda a day   Social Determinants of Health   Financial Resource Strain: Not on file  Food Insecurity: Not  on file  Transportation Needs: Not on file  Physical Activity: Not on file  Stress: Not on file  Social Connections: Not on file   Family History  Problem Relation Age of Onset   Breast cancer Mother        dx age late 81s   Asthma Father    Uterine cancer Maternal Aunt    Colon cancer Other        Colon   Diabetes Other        GGF   Coronary artery disease Other    Congestive Heart Failure Other    Allergies  Allergen Reactions   Gluten Meal Diarrhea   Soy Allergy Nausea And Vomiting   Wound Dressing Adhesive Rash    Glue on Bandaids   Prior to Admission medications   Medication Sig Start Date End Date Taking? Authorizing Provider  acetaminophen (TYLENOL) 500 MG tablet Take 1,000 mg by mouth every 6 (six) hours as needed for mild pain.    [provider]  albuterol (VENTOLIN HFA) 108 (90 Base) MCG/ACT inhaler INHALE TWO PUFFS BY MOUTH EVERY 6 HOURS AS NEEDED FOR WHEEZING OR FOR SHORTNESS OF BREATH 02/22/21   Wanda Plump, MD  ALPRAZolam Prudy Feeler) 0.25 MG tablet Take 1 tablet (  0.25 mg total) by mouth at bedtime as needed for anxiety. 03/01/21   Dohmeier, Porfirio Mylar, MD  ASHWAGANDHA PO Take 1 capsule by mouth daily.    [provider]  cephALEXin (KEFLEX) 500 MG capsule Take 1 capsule (500 mg total) by mouth 2 (two) times daily. 02/23/21   Drema Halon, MD  Lactobacillus (PROBIOTIC ACIDOPHILUS PO) Take 1 capsule by mouth daily.    [provider]  MAGNESIUM CITRATE PO Take 1 tablet by mouth daily.    [provider]  Norgestimate-Ethinyl Estradiol Triphasic 0.18/0.215/0.25 MG-25 MCG tab Take 1 tablet by mouth daily.    [provider]  Nutritional Supplements (JUICE PLUS FIBRE PO) Take 3 capsules by mouth daily. Veggie    [provider]  OVER THE COUNTER MEDICATION Take 4 capsules by mouth daily. Myo-inositol    [provider]  Tetrahydrozoline-Zn Sulfate (EYE DROPS ALLERGY RELIEF OP) Place 1 drop into both eyes daily  as needed (allergy).    [provider]  triamcinolone (NASACORT) 55 MCG/ACT AERO nasal inhaler Place 2 sprays into the nose daily. 2 sprays each nostril at night. Patient taking differently: Place 2 sprays into the nose daily as needed (allergies). 12/15/20   Drema Halon, MD     Physical Exam: Nasal passages are clear bilaterally with minimal crusting   Assessment: S/p septoplasty and turbinate reductions with reduction of right middle turbinate polypoid concha bullosa.  Plan: She will follow-up as needed.   Narda Bonds, MD

## 2021-04-07 ENCOUNTER — Encounter: Payer: Self-pay | Admitting: Neurology

## 2021-05-06 ENCOUNTER — Emergency Department (HOSPITAL_BASED_OUTPATIENT_CLINIC_OR_DEPARTMENT_OTHER): Payer: BC Managed Care – PPO

## 2021-05-06 ENCOUNTER — Ambulatory Visit: Payer: BC Managed Care – PPO | Admitting: Internal Medicine

## 2021-05-06 ENCOUNTER — Encounter (HOSPITAL_BASED_OUTPATIENT_CLINIC_OR_DEPARTMENT_OTHER): Payer: Self-pay

## 2021-05-06 ENCOUNTER — Emergency Department (HOSPITAL_BASED_OUTPATIENT_CLINIC_OR_DEPARTMENT_OTHER)
Admission: EM | Admit: 2021-05-06 | Discharge: 2021-05-06 | Disposition: A | Discharge status: Home / Self Care | Payer: BC Managed Care – PPO | Attending: Emergency Medicine | Admitting: Emergency Medicine

## 2021-05-06 ENCOUNTER — Other Ambulatory Visit: Payer: Self-pay

## 2021-05-06 DIAGNOSIS — S299XXA Unspecified injury of thorax, initial encounter: Secondary | ICD-10-CM

## 2021-05-06 DIAGNOSIS — Y92410 Unspecified street and highway as the place of occurrence of the external cause: Secondary | ICD-10-CM | POA: Insufficient documentation

## 2021-05-06 DIAGNOSIS — J45909 Unspecified asthma, uncomplicated: Secondary | ICD-10-CM | POA: Insufficient documentation

## 2021-05-06 DIAGNOSIS — Z041 Encounter for examination and observation following transport accident: Secondary | ICD-10-CM | POA: Diagnosis not present

## 2021-05-06 DIAGNOSIS — S3991XA Unspecified injury of abdomen, initial encounter: Secondary | ICD-10-CM | POA: Diagnosis not present

## 2021-05-06 DIAGNOSIS — S20312A Abrasion of left front wall of thorax, initial encounter: Secondary | ICD-10-CM | POA: Diagnosis not present

## 2021-05-06 DIAGNOSIS — S59911A Unspecified injury of right forearm, initial encounter: Secondary | ICD-10-CM | POA: Diagnosis not present

## 2021-05-06 DIAGNOSIS — S8992XA Unspecified injury of left lower leg, initial encounter: Secondary | ICD-10-CM | POA: Insufficient documentation

## 2021-05-06 DIAGNOSIS — S59912A Unspecified injury of left forearm, initial encounter: Secondary | ICD-10-CM | POA: Insufficient documentation

## 2021-05-06 DIAGNOSIS — S99922A Unspecified injury of left foot, initial encounter: Secondary | ICD-10-CM | POA: Insufficient documentation

## 2021-05-06 DIAGNOSIS — Y9389 Activity, other specified: Secondary | ICD-10-CM | POA: Insufficient documentation

## 2021-05-06 DIAGNOSIS — K429 Umbilical hernia without obstruction or gangrene: Secondary | ICD-10-CM | POA: Diagnosis not present

## 2021-05-06 LAB — CBC
HCT: 41 % (ref 36.0–46.0)
Hemoglobin: 14.2 g/dL (ref 12.0–15.0)
MCH: 30.5 pg (ref 26.0–34.0)
MCHC: 34.6 g/dL (ref 30.0–36.0)
MCV: 88.2 fL (ref 80.0–100.0)
Platelets: 264 10*3/uL (ref 150–400)
RBC: 4.65 MIL/uL (ref 3.87–5.11)
RDW: 12.5 % (ref 11.5–15.5)
WBC: 9.6 10*3/uL (ref 4.0–10.5)
nRBC: 0 % (ref 0.0–0.2)

## 2021-05-06 LAB — COMPREHENSIVE METABOLIC PANEL
ALT: 23 U/L (ref 0–44)
AST: 21 U/L (ref 15–41)
Albumin: 4.4 g/dL (ref 3.5–5.0)
Alkaline Phosphatase: 54 U/L (ref 38–126)
Anion gap: 8 (ref 5–15)
BUN: 11 mg/dL (ref 6–20)
CO2: 25 mmol/L (ref 22–32)
Calcium: 9 mg/dL (ref 8.9–10.3)
Chloride: 106 mmol/L (ref 98–111)
Creatinine, Ser: 0.71 mg/dL (ref 0.44–1.00)
GFR, Estimated: >60 mL/min (ref >60)
Glucose, Bld: 108 mg/dL — ABNORMAL HIGH (ref 70–99)
Potassium: 3.4 mmol/L — ABNORMAL LOW (ref 3.5–5.1)
Sodium: 139 mmol/L (ref 135–145)
Total Bilirubin: 0.6 mg/dL (ref 0.3–1.2)
Total Protein: 7.2 g/dL (ref 6.5–8.1)

## 2021-05-06 LAB — URINALYSIS, ROUTINE W REFLEX MICROSCOPIC
Bilirubin Urine: NEGATIVE
Glucose, UA: NEGATIVE mg/dL
Ketones, ur: NEGATIVE mg/dL
Leukocytes,Ua: NEGATIVE
Nitrite: NEGATIVE
Protein, ur: 100 mg/dL — AB
Specific Gravity, Urine: >1.03 — ABNORMAL HIGH (ref 1.005–1.030)
pH: 6.5 (ref 5.0–8.0)

## 2021-05-06 LAB — PREGNANCY, URINE: Preg Test, Ur: NEGATIVE

## 2021-05-06 LAB — URINALYSIS, MICROSCOPIC (REFLEX)

## 2021-05-06 MED ORDER — METHOCARBAMOL 500 MG PO TABS: 500.0000 mg | ORAL_TABLET | Freq: Two times a day (BID) | ORAL | 0 refills | Status: DC

## 2021-05-06 MED ORDER — NAPROXEN 375 MG PO TABS: 375.0000 mg | ORAL_TABLET | Freq: Two times a day (BID) | ORAL | 0 refills | Status: DC

## 2021-05-06 MED ORDER — ACETAMINOPHEN 325 MG PO TABS
650.0000 mg | ORAL_TABLET | Freq: Once | ORAL | Status: AC
  Administered 2021-05-06: 650 mg via ORAL
  Filled 2021-05-06: qty 2

## 2021-05-06 MED ORDER — IOHEXOL 300 MG/ML  SOLN
100.0000 mL | Freq: Once | INTRAMUSCULAR | Status: AC | PRN
  Administered 2021-05-06: 100 mL via INTRAVENOUS
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
## 2021-05-06 NOTE — Discharge Instructions (Signed)
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
## 2021-05-10 ENCOUNTER — Ambulatory Visit (INDEPENDENT_AMBULATORY_CARE_PROVIDER_SITE_OTHER): Payer: BC Managed Care – PPO | Admitting: Internal Medicine

## 2021-05-10 ENCOUNTER — Encounter: Payer: Self-pay | Admitting: Internal Medicine

## 2021-05-10 ENCOUNTER — Other Ambulatory Visit: Payer: Self-pay

## 2021-05-10 DIAGNOSIS — K589 Irritable bowel syndrome without diarrhea: Secondary | ICD-10-CM

## 2021-05-10 DIAGNOSIS — R8271 Bacteriuria: Secondary | ICD-10-CM

## 2021-05-10 DIAGNOSIS — R9089 Other abnormal findings on diagnostic imaging of central nervous system: Secondary | ICD-10-CM

## 2021-05-10 DIAGNOSIS — G4733 Obstructive sleep apnea (adult) (pediatric): Secondary | ICD-10-CM

## 2021-05-10 DIAGNOSIS — R42 Dizziness and giddiness: Secondary | ICD-10-CM | POA: Diagnosis not present

## 2021-05-10 MED ORDER — METHOCARBAMOL 500 MG PO TABS: 500.0000 mg | ORAL_TABLET | Freq: Four times a day (QID) | ORAL | 0 refills | Status: DC | PRN
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
## 2021-05-10 NOTE — Patient Instructions (Addendum)
Please call the gastroenterology office and set up an appointment [phone]  See orthopedic doctor this week regards the neck pain    GO TO THE LAB : Provide a urine sample   GO TO THE FRONT DESK, PLEASE SCHEDULE YOUR APPOINTMENTS Come back for a physical exam by 01-2022, sooner if needed.

## 2021-05-11 LAB — URINALYSIS, ROUTINE W REFLEX MICROSCOPIC
Bilirubin Urine: NEGATIVE
Hgb urine dipstick: NEGATIVE
Ketones, ur: NEGATIVE
Leukocytes,Ua: NEGATIVE
Nitrite: NEGATIVE
RBC / HPF: NONE SEEN (ref 0–?)
Specific Gravity, Urine: 1.025 (ref 1.000–1.030)
Total Protein, Urine: NEGATIVE
Urine Glucose: NEGATIVE
Urobilinogen, UA: 0.2 (ref 0.0–1.0)
pH: 6 (ref 5.0–8.0)

## 2021-05-11 LAB — URINE CULTURE
MICRO NUMBER:: 12669908
Result:: NO GROWTH
SPECIMEN QUALITY:: ADEQUATE

## 2021-05-17 DIAGNOSIS — R922 Inconclusive mammogram: Secondary | ICD-10-CM | POA: Diagnosis not present

## 2021-05-17 DIAGNOSIS — M9905 Segmental and somatic dysfunction of pelvic region: Secondary | ICD-10-CM | POA: Diagnosis not present

## 2021-05-17 DIAGNOSIS — M9903 Segmental and somatic dysfunction of lumbar region: Secondary | ICD-10-CM | POA: Diagnosis not present

## 2021-05-17 DIAGNOSIS — M9902 Segmental and somatic dysfunction of thoracic region: Secondary | ICD-10-CM | POA: Diagnosis not present

## 2021-05-17 DIAGNOSIS — Z803 Family history of malignant neoplasm of breast: Secondary | ICD-10-CM | POA: Diagnosis not present

## 2021-05-17 DIAGNOSIS — M9901 Segmental and somatic dysfunction of cervical region: Secondary | ICD-10-CM | POA: Diagnosis not present

## 2021-05-17 DIAGNOSIS — Z9189 Other specified personal risk factors, not elsewhere classified: Secondary | ICD-10-CM | POA: Diagnosis not present

## 2021-05-23 DIAGNOSIS — Z803 Family history of malignant neoplasm of breast: Secondary | ICD-10-CM | POA: Diagnosis not present

## 2021-05-23 DIAGNOSIS — R922 Inconclusive mammogram: Secondary | ICD-10-CM | POA: Diagnosis not present

## 2021-05-23 DIAGNOSIS — C50919 Malignant neoplasm of unspecified site of unspecified female breast: Secondary | ICD-10-CM | POA: Diagnosis not present

## 2021-05-23 DIAGNOSIS — Z9189 Other specified personal risk factors, not elsewhere classified: Secondary | ICD-10-CM | POA: Diagnosis not present

## 2021-05-23 DIAGNOSIS — R928 Other abnormal and inconclusive findings on diagnostic imaging of breast: Secondary | ICD-10-CM | POA: Diagnosis not present

## 2021-05-24 DIAGNOSIS — M9905 Segmental and somatic dysfunction of pelvic region: Secondary | ICD-10-CM | POA: Diagnosis not present

## 2021-05-24 DIAGNOSIS — M9902 Segmental and somatic dysfunction of thoracic region: Secondary | ICD-10-CM | POA: Diagnosis not present

## 2021-05-24 DIAGNOSIS — M9903 Segmental and somatic dysfunction of lumbar region: Secondary | ICD-10-CM | POA: Diagnosis not present

## 2021-05-24 DIAGNOSIS — M9901 Segmental and somatic dysfunction of cervical region: Secondary | ICD-10-CM | POA: Diagnosis not present

## 2021-05-25 ENCOUNTER — Encounter: Payer: Self-pay | Admitting: Internal Medicine

## 2021-05-25 ENCOUNTER — Ambulatory Visit (INDEPENDENT_AMBULATORY_CARE_PROVIDER_SITE_OTHER): Payer: BC Managed Care – PPO | Admitting: Internal Medicine

## 2021-05-25 VITALS — BP 100/70 | HR 85 | Ht 66.0 in | Wt 177.0 lb

## 2021-05-25 DIAGNOSIS — K582 Mixed irritable bowel syndrome: Secondary | ICD-10-CM

## 2021-05-25 DIAGNOSIS — K921 Melena: Secondary | ICD-10-CM | POA: Diagnosis not present

## 2021-05-25 DIAGNOSIS — R1084 Generalized abdominal pain: Secondary | ICD-10-CM | POA: Diagnosis not present

## 2021-05-25 MED ORDER — DICYCLOMINE HCL 10 MG PO CAPS: 10.0000 mg | ORAL_CAPSULE | Freq: Three times a day (TID) | ORAL | 1 refills | Status: AC | PRN
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
## 2021-05-25 NOTE — Patient Instructions (Signed)
We have sent the following medications to your pharmacy for you to pick up at your convenience: dicyclomine.   You have been scheduled for an endoscopy. Please follow written instructions given to you at your visit today. If you use inhalers (even only as needed), please bring them with you on the day of your procedure.  Start over the counter Miralax mixing 17 grams in 8 oz of water daily.   Start the [employer].  Due to recent changes in healthcare laws, you may see the results of your imaging and laboratory studies on MyChart before your provider has had a chance to review them.  We understand that in some cases there may be results that are confusing or concerning to you. Not all laboratory results come back in the same time frame and the provider may be waiting for multiple results in order to interpret others.  Please give [place] 48 hours in order for your provider to thoroughly review all the results before contacting the office for clarification of your results.   The [facility] providers would like to encourage you to use [facility] to communicate with providers for non-urgent requests or questions.  Due to long hold times on the telephone, sending your provider a message by [facility] may be a faster and more efficient way to get a response.  Please allow 48 business hours for a response.  Please remember that this is for non-urgent requests.

## 2021-05-26 DIAGNOSIS — M9902 Segmental and somatic dysfunction of thoracic region: Secondary | ICD-10-CM | POA: Diagnosis not present

## 2021-05-26 DIAGNOSIS — M9901 Segmental and somatic dysfunction of cervical region: Secondary | ICD-10-CM | POA: Diagnosis not present

## 2021-05-26 DIAGNOSIS — M9903 Segmental and somatic dysfunction of lumbar region: Secondary | ICD-10-CM | POA: Diagnosis not present

## 2021-05-26 DIAGNOSIS — M9905 Segmental and somatic dysfunction of pelvic region: Secondary | ICD-10-CM | POA: Diagnosis not present

## 2021-05-27 DIAGNOSIS — S134XXA Sprain of ligaments of cervical spine, initial encounter: Secondary | ICD-10-CM | POA: Diagnosis not present

## 2021-05-27 DIAGNOSIS — M5451 Vertebrogenic low back pain: Secondary | ICD-10-CM | POA: Diagnosis not present

## 2021-05-27 DIAGNOSIS — M542 Cervicalgia: Secondary | ICD-10-CM | POA: Diagnosis not present

## 2021-06-01 DIAGNOSIS — M542 Cervicalgia: Secondary | ICD-10-CM | POA: Diagnosis not present

## 2021-06-01 DIAGNOSIS — M62838 Other muscle spasm: Secondary | ICD-10-CM | POA: Diagnosis not present

## 2021-06-06 DIAGNOSIS — M9905 Segmental and somatic dysfunction of pelvic region: Secondary | ICD-10-CM | POA: Diagnosis not present

## 2021-06-06 DIAGNOSIS — M9902 Segmental and somatic dysfunction of thoracic region: Secondary | ICD-10-CM | POA: Diagnosis not present

## 2021-06-06 DIAGNOSIS — M9903 Segmental and somatic dysfunction of lumbar region: Secondary | ICD-10-CM | POA: Diagnosis not present

## 2021-06-06 DIAGNOSIS — M9901 Segmental and somatic dysfunction of cervical region: Secondary | ICD-10-CM | POA: Diagnosis not present

## 2021-06-07 DIAGNOSIS — M62838 Other muscle spasm: Secondary | ICD-10-CM | POA: Diagnosis not present

## 2021-06-07 DIAGNOSIS — M542 Cervicalgia: Secondary | ICD-10-CM | POA: Diagnosis not present

## 2021-06-08 DIAGNOSIS — M9905 Segmental and somatic dysfunction of pelvic region: Secondary | ICD-10-CM | POA: Diagnosis not present

## 2021-06-08 DIAGNOSIS — M9902 Segmental and somatic dysfunction of thoracic region: Secondary | ICD-10-CM | POA: Diagnosis not present

## 2021-06-08 DIAGNOSIS — M9903 Segmental and somatic dysfunction of lumbar region: Secondary | ICD-10-CM | POA: Diagnosis not present

## 2021-06-08 DIAGNOSIS — M9901 Segmental and somatic dysfunction of cervical region: Secondary | ICD-10-CM | POA: Diagnosis not present

## 2021-06-09 DIAGNOSIS — M62838 Other muscle spasm: Secondary | ICD-10-CM | POA: Diagnosis not present

## 2021-06-09 DIAGNOSIS — M542 Cervicalgia: Secondary | ICD-10-CM | POA: Diagnosis not present

## 2021-06-14 DIAGNOSIS — M9903 Segmental and somatic dysfunction of lumbar region: Secondary | ICD-10-CM | POA: Diagnosis not present

## 2021-06-14 DIAGNOSIS — M9905 Segmental and somatic dysfunction of pelvic region: Secondary | ICD-10-CM | POA: Diagnosis not present

## 2021-06-14 DIAGNOSIS — M9901 Segmental and somatic dysfunction of cervical region: Secondary | ICD-10-CM | POA: Diagnosis not present

## 2021-06-14 DIAGNOSIS — M9902 Segmental and somatic dysfunction of thoracic region: Secondary | ICD-10-CM | POA: Diagnosis not present

## 2021-06-15 DIAGNOSIS — M62838 Other muscle spasm: Secondary | ICD-10-CM | POA: Diagnosis not present

## 2021-06-15 DIAGNOSIS — M542 Cervicalgia: Secondary | ICD-10-CM | POA: Diagnosis not present

## 2021-06-17 DIAGNOSIS — M9901 Segmental and somatic dysfunction of cervical region: Secondary | ICD-10-CM | POA: Diagnosis not present

## 2021-06-17 DIAGNOSIS — M62838 Other muscle spasm: Secondary | ICD-10-CM | POA: Diagnosis not present

## 2021-06-17 DIAGNOSIS — M9902 Segmental and somatic dysfunction of thoracic region: Secondary | ICD-10-CM | POA: Diagnosis not present

## 2021-06-17 DIAGNOSIS — M9905 Segmental and somatic dysfunction of pelvic region: Secondary | ICD-10-CM | POA: Diagnosis not present

## 2021-06-17 DIAGNOSIS — M9903 Segmental and somatic dysfunction of lumbar region: Secondary | ICD-10-CM | POA: Diagnosis not present

## 2021-06-17 DIAGNOSIS — M542 Cervicalgia: Secondary | ICD-10-CM | POA: Diagnosis not present

## 2021-06-21 DIAGNOSIS — M9905 Segmental and somatic dysfunction of pelvic region: Secondary | ICD-10-CM | POA: Diagnosis not present

## 2021-06-21 DIAGNOSIS — M9901 Segmental and somatic dysfunction of cervical region: Secondary | ICD-10-CM | POA: Diagnosis not present

## 2021-06-21 DIAGNOSIS — M9903 Segmental and somatic dysfunction of lumbar region: Secondary | ICD-10-CM | POA: Diagnosis not present

## 2021-06-21 DIAGNOSIS — M9902 Segmental and somatic dysfunction of thoracic region: Secondary | ICD-10-CM | POA: Diagnosis not present

## 2021-06-22 DIAGNOSIS — M62838 Other muscle spasm: Secondary | ICD-10-CM | POA: Diagnosis not present

## 2021-06-22 DIAGNOSIS — M542 Cervicalgia: Secondary | ICD-10-CM | POA: Diagnosis not present

## 2021-06-23 DIAGNOSIS — M9903 Segmental and somatic dysfunction of lumbar region: Secondary | ICD-10-CM | POA: Diagnosis not present

## 2021-06-23 DIAGNOSIS — M9905 Segmental and somatic dysfunction of pelvic region: Secondary | ICD-10-CM | POA: Diagnosis not present

## 2021-06-23 DIAGNOSIS — M9902 Segmental and somatic dysfunction of thoracic region: Secondary | ICD-10-CM | POA: Diagnosis not present

## 2021-06-23 DIAGNOSIS — M9901 Segmental and somatic dysfunction of cervical region: Secondary | ICD-10-CM | POA: Diagnosis not present

## 2021-06-24 DIAGNOSIS — M62838 Other muscle spasm: Secondary | ICD-10-CM | POA: Diagnosis not present

## 2021-06-24 DIAGNOSIS — M542 Cervicalgia: Secondary | ICD-10-CM | POA: Diagnosis not present

## 2021-06-27 DIAGNOSIS — M9905 Segmental and somatic dysfunction of pelvic region: Secondary | ICD-10-CM | POA: Diagnosis not present

## 2021-06-27 DIAGNOSIS — M9902 Segmental and somatic dysfunction of thoracic region: Secondary | ICD-10-CM | POA: Diagnosis not present

## 2021-06-27 DIAGNOSIS — M9903 Segmental and somatic dysfunction of lumbar region: Secondary | ICD-10-CM | POA: Diagnosis not present

## 2021-06-27 DIAGNOSIS — M9901 Segmental and somatic dysfunction of cervical region: Secondary | ICD-10-CM | POA: Diagnosis not present

## 2021-06-30 DIAGNOSIS — M542 Cervicalgia: Secondary | ICD-10-CM | POA: Diagnosis not present

## 2021-06-30 DIAGNOSIS — M62838 Other muscle spasm: Secondary | ICD-10-CM | POA: Diagnosis not present

## 2021-07-01 ENCOUNTER — Encounter: Payer: Self-pay | Admitting: Internal Medicine

## 2021-07-04 ENCOUNTER — Telehealth: Payer: Self-pay
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
## 2021-07-04 NOTE — Telephone Encounter (Signed)
LVM for pt to bring cpap/power cord to appt.  °

## 2021-07-05 ENCOUNTER — Ambulatory Visit (INDEPENDENT_AMBULATORY_CARE_PROVIDER_SITE_OTHER): Payer: BC Managed Care – PPO | Admitting: Family Medicine

## 2021-07-05 ENCOUNTER — Encounter: Payer: Self-pay | Admitting: Family Medicine

## 2021-07-05 VITALS — BP 118/68 | HR 102 | Ht 65.0 in | Wt 179.0 lb

## 2021-07-05 DIAGNOSIS — H814 Vertigo of central origin: Secondary | ICD-10-CM

## 2021-07-05 DIAGNOSIS — Z9889 Other specified postprocedural states: Secondary | ICD-10-CM

## 2021-07-05 DIAGNOSIS — G4733 Obstructive sleep apnea (adult) (pediatric): Secondary | ICD-10-CM

## 2021-07-05 DIAGNOSIS — G4710 Hypersomnia, unspecified: Secondary | ICD-10-CM | POA: Diagnosis not present

## 2021-07-05 DIAGNOSIS — M62838 Other muscle spasm: Secondary | ICD-10-CM | POA: Diagnosis not present

## 2021-07-05 DIAGNOSIS — Z9989 Dependence on other enabling machines and devices: Secondary | ICD-10-CM

## 2021-07-05 DIAGNOSIS — M542 Cervicalgia: Secondary | ICD-10-CM | POA: Diagnosis not present

## 2021-07-05 MED ORDER — ALPRAZOLAM 0.25 MG PO TABS: 0.2500 mg | ORAL_TABLET | Freq: Every evening | ORAL | 0 refills | Status: DC | PRN

## 2021-07-05 MED ORDER — ARMODAFINIL 50 MG PO TABS: 100.0000 mg | ORAL_TABLET | Freq: Every day | ORAL | 1 refills | Status: DC

## 2021-07-05 NOTE — Patient Instructions (Addendum)
Please continue using your CPAP regularly. While your insurance requires that you use CPAP at least 4 hours each night on 70% of the nights, I recommend, that you not skip any nights and use it throughout the night if you can. Getting used to CPAP and staying with the treatment long term does take time and patience and discipline. Untreated obstructive sleep apnea when it is moderate to severe can have an adverse impact on cardiovascular health and raise her risk for heart disease, arrhythmias, hypertension, congestive heart failure, stroke and diabetes. Untreated obstructive sleep apnea causes sleep disruption, nonrestorative sleep, and sleep deprivation. This can have an impact on your day to day functioning and cause daytime sleepiness and impairment of cognitive function, memory loss, mood disturbance, and problems focussing. Using CPAP regularly can improve these symptoms.   Continue CPAP usage every night. Use alprazolam sparingly for severe vertigo.   We will add armodafinil 100mg  daily. Please take in the mornings. I have included information in your after visit summary as well. Call me if you have any difficulty with this medication.   Follow up with Dr in 6 months for consideration of MSLT testing if needed.

## 2021-07-07 DIAGNOSIS — M9901 Segmental and somatic dysfunction of cervical region: Secondary | ICD-10-CM | POA: Diagnosis not present

## 2021-07-07 DIAGNOSIS — M9902 Segmental and somatic dysfunction of thoracic region: Secondary | ICD-10-CM | POA: Diagnosis not present

## 2021-07-07 DIAGNOSIS — M9903 Segmental and somatic dysfunction of lumbar region: Secondary | ICD-10-CM | POA: Diagnosis not present

## 2021-07-07 DIAGNOSIS — M9905 Segmental and somatic dysfunction of pelvic region: Secondary | ICD-10-CM | POA: Diagnosis not present

## 2021-07-08 ENCOUNTER — Encounter: Payer: Self-pay | Admitting: Internal Medicine

## 2021-07-08 ENCOUNTER — Ambulatory Visit (AMBULATORY_SURGERY_CENTER): Payer: BC Managed Care – PPO | Admitting: Internal Medicine

## 2021-07-08 VITALS — BP 109/58 | HR 68 | Temp 98.4°F | Resp 11 | Ht 66.0 in | Wt 177.0 lb

## 2021-07-08 DIAGNOSIS — K2970 Gastritis, unspecified, without bleeding: Secondary | ICD-10-CM | POA: Diagnosis not present

## 2021-07-08 DIAGNOSIS — R112 Nausea with vomiting, unspecified: Secondary | ICD-10-CM

## 2021-07-08 DIAGNOSIS — K921 Melena: Secondary | ICD-10-CM | POA: Diagnosis not present

## 2021-07-08 DIAGNOSIS — K219 Gastro-esophageal reflux disease without esophagitis: Secondary | ICD-10-CM

## 2021-07-08 DIAGNOSIS — K582 Mixed irritable bowel syndrome: Secondary | ICD-10-CM

## 2021-07-08 DIAGNOSIS — K2289 Other specified disease of esophagus: Secondary | ICD-10-CM | POA: Diagnosis not present

## 2021-07-08 MED ORDER — SODIUM CHLORIDE 0.9 % IV SOLN
500.0000 mL | Freq: Once | INTRAVENOUS | Status: DC
Start: 2021-07-08 — End: 2021-07-08

## 2021-07-08 MED ORDER — OMEPRAZOLE 40 MG PO CPDR: 40.0000 mg | DELAYED_RELEASE_CAPSULE | Freq: Two times a day (BID) | ORAL | 0 refills | Status: AC

## 2021-07-08 NOTE — Progress Notes (Signed)
Pt in recovery with monitors in place, VSS. Report given to receiving RN. Bite guard was placed with pt awake to ensure comfort. No dental or soft tissue damage noted. 

## 2021-07-08 NOTE — Progress Notes (Signed)
Called to room to assist during endoscopic procedure.  Patient ID and intended procedure confirmed with present staff. Received instructions for my participation in the procedure from the performing physician.  

## 2021-07-08 NOTE — Patient Instructions (Addendum)
Thank you for allowing [place] to care for you today! Await biopsy results, approximately 2 weeks.  Will make necessary recommendations at that time. Resume previous diet and medications today. Return to normal daily activities tomorrow, 07/09/21. Prescription sent to your pharmacy, Omeprazole 40 mg twice per day for 8 weeks.  Return to GI clinic as previously scheduled 07/26/21 @ 1:30 pm .   YOU HAD AN ENDOSCOPIC PROCEDURE TODAY AT THE [facility]:   Refer to the procedure report that was given to you for any specific questions about what was found during the examination.  If the procedure report does not answer your questions, please call your gastroenterologist to clarify.  If you requested that your care partner not be given the details of your procedure findings, then the procedure report has been included in a sealed envelope for you to review at your convenience later.  YOU SHOULD EXPECT: Some feelings of bloating in the abdomen. Passage of more gas than usual.  Walking can help get rid of the air that was put into your GI tract during the procedure and reduce the bloating. If you had a lower endoscopy (such as a colonoscopy or flexible sigmoidoscopy) you may notice spotting of blood in your stool or on the toilet paper. If you underwent a bowel prep for your procedure, you may not have a normal bowel movement for a few days.  Please Note:  You might notice some irritation and congestion in your nose or some drainage.  This is from the oxygen used during your procedure.  There is no need for concern and it should clear up in a day or so.  SYMPTOMS TO REPORT IMMEDIATELY:   Following upper endoscopy (EGD)  Vomiting of blood or coffee ground material  New chest pain or pain under the shoulder blades  Painful or persistently difficult swallowing  New shortness of breath  Fever of 100F or higher  Black, tarry-looking stools  For urgent or emergent issues, a gastroenterologist can be  reached at any hour by calling [phone]. Do not use MyChart messaging for urgent concerns.    DIET:  We do recommend a small meal at first, but then you may proceed to your regular diet.  Drink plenty of fluids but you should avoid alcoholic beverages for 24 hours.  ACTIVITY:  You should plan to take it easy for the rest of today and you should NOT DRIVE or use heavy machinery until tomorrow (because of the sedation medicines used during the test).    FOLLOW UP: Our staff will call the number listed on your records 48-72 hours following your procedure to check on you and address any questions or concerns that you may have regarding the information given to you following your procedure. If we do not reach you, we will leave a message.  We will attempt to reach you two times.  During this call, we will ask if you have developed any symptoms of COVID 19. If you develop any symptoms (ie: fever, flu-like symptoms, shortness of breath, cough etc.) before then, please call [phone].  If you test positive for Covid 19 in the 2 weeks post procedure, please call and report this information to [place].    If any biopsies were taken you will be contacted by phone or by letter within the next 1-3 weeks.  Please call us at [phone] if you have not heard about the biopsies in 3 weeks.    SIGNATURES/CONFIDENTIALITY: You and/or your care  partner have signed paperwork which will be entered into your electronic medical record.  These signatures attest to the fact that that the information above on your After Visit Summary has been reviewed and is understood.  Full responsibility of the confidentiality of this discharge information lies with you and/or your care-partner.

## 2021-07-08 NOTE — Op Note (Signed)
[facility] Patient Name: Brandi Hall Procedure Date: 07/08/2021 3:45 PM MRN: [id number] Endoscopist: Nicole Kindred "Brandi Hall ,  Age: 31 Referring MD:  Date of Birth: [birth year] Gender: Female Account #: [id number] Procedure:                Upper GI endoscopy Indications:              Melena Medicines:                Monitored Anesthesia Care Procedure:                Pre-Anesthesia Assessment:                           - Prior to the procedure, a History and Physical                            was performed, and patient medications and                            allergies were reviewed. The patient's tolerance of                            previous anesthesia was also reviewed. The risks                            and benefits of the procedure and the sedation                            options and risks were discussed with the patient.                            All questions were answered, and informed consent                            was obtained. Prior Anticoagulants: The patient has                            taken no previous anticoagulant or antiplatelet                            agents. ASA Grade Assessment: II - A patient with                            mild systemic disease. After reviewing the risks                            and benefits, the patient was deemed in                            satisfactory condition to undergo the procedure.                           After obtaining informed consent, the endoscope was  passed under direct vision. Throughout the                            procedure, the patient's blood pressure, pulse, and                            oxygen saturations were monitored continuously. The                            Olympus [id number] was introduced through                            the mouth, and advanced to the second part of                            duodenum. The upper GI endoscopy was  accomplished                            without difficulty. The patient tolerated the                            procedure well. Scope In: Scope Out: Findings:                 White nummular lesions were noted in the entire                            esophagus. Biopsies were taken with a cold forceps                            for histology.                           Localized mild inflammation characterized by                            erosions and linear erosions was found in the                            gastric fundus. Biopsies were taken with a cold                            forceps for histology.                           The examined duodenum was normal. Biopsies were                            taken with a cold forceps for histology. Complications:            No immediate complications. Estimated Blood Loss:     Estimated blood loss was minimal. Impression:               - White nummular lesions in esophageal mucosa.  Biopsied.                           - Gastritis. Biopsied.                           - Normal examined duodenum. Biopsied. Recommendation:           - Discharge patient to home (with escort).                           - Await pathology results.                           - Take omeprazole 40 mg twice daily for 8 weeks.                           - Avoid NSAIDs if possible.                           - Return to GI clinic as previously scheduled.                           - The findings and recommendations were discussed                            with the patient. Nicole Kindred "Brandi Hall,  07/08/2021 4:16:31 PM

## 2021-07-08 NOTE — Progress Notes (Signed)
VS by DT    

## 2021-07-08 NOTE — Progress Notes (Signed)
GASTROENTEROLOGY PROCEDURE H&P NOTE   Primary Care Physician: Wanda Plump, MD    Reason for Procedure:   Melena, N&V  Plan:    EGD  Patient is appropriate for endoscopic procedure(s) in the ambulatory ([facility]) setting.  The nature of the procedure, as well as the risks, benefits, and alternatives were carefully and thoroughly reviewed with the patient. Ample time for discussion and questions allowed. The patient understood, was satisfied, and agreed to proceed.     HPI: Brandi Hall is a 31 y.o. female who presents for EGD for evaluation of melena and N&V .  Patient was most recently seen in the Gastroenterology Clinic on 05/25/21.  No interval change in medical history since that appointment. Please refer to that note for full details regarding GI history and clinical presentation.   Past Medical History:  Diagnosis Date   Allergic rhinitis    Anxiety    Asthma    Bronchitis    Lymph node abscess    hosp for infected lymph node (Salmonella) at 31 y/o   PCOS (polycystic ovarian syndrome)    Pneumonia    Post partum depression    Post term pregnancy, 41 weeks 12/18/2014   Postpartum care following vaginal delivery (7/1) 12/18/2014   Sleep apnea    CPAP @ [facility]    Past Surgical History:  Procedure Laterality Date   LAD bx     MASS EXCISION Right 02/22/2021   Procedure: EXCISION RIGHT NASAL POLYPOID MASS;  Surgeon: Drema Halon, MD;  Location: [facility] OR;  Service: ENT;  Laterality: Right;   NASAL SEPTOPLASTY W/ TURBINOPLASTY N/A 02/22/2021   Procedure: NASAL SEPTOPLASTY WITH TURBINATE REDUCTION;  Surgeon: Drema Halon, MD;  Location: [facility] OR;  Service: ENT;  Laterality: N/A;   WISDOM TOOTH EXTRACTION      Prior to Admission medications   Medication Sig Start Date End Date Taking? Authorizing Provider  albuterol (VENTOLIN HFA) 108 (90 Base) MCG/ACT inhaler INHALE TWO PUFFS BY MOUTH EVERY 6 HOURS AS NEEDED FOR WHEEZING OR FOR SHORTNESS OF BREATH 02/22/21  Yes Paz,  Nolon Rod, MD  ALPRAZolam Prudy Feeler) 0.25 MG tablet Take 1 tablet (0.25 mg total) by mouth at bedtime as needed for anxiety. 07/05/21  Yes Lomax, Amy, NP  Armodafinil 50 MG tablet Take 2 tablets (100 mg total) by mouth daily. 07/05/21  Yes Lomax, Amy, NP  OVER THE COUNTER MEDICATION Take 4 capsules by mouth daily. Myo-inositol   Yes [provider]  acetaminophen (TYLENOL) 500 MG tablet Take 1,000 mg by mouth every 6 (six) hours as needed for mild pain.    [provider]  ASHWAGANDHA PO Take 1 capsule by mouth daily.    [provider]  dicyclomine (BENTYL) 10 MG capsule Take 1 capsule (10 mg total) by mouth 3 (three) times daily as needed for spasms. 05/25/21   Imogene Burn, MD  Lactobacillus (PROBIOTIC ACIDOPHILUS PO) Take 1 capsule by mouth daily.    [provider]  MAGNESIUM CITRATE PO Take 1 tablet by mouth daily.    [provider]  methocarbamol (ROBAXIN) 500 MG tablet Take 1 tablet (500 mg total) by mouth every 6 (six) hours as needed for muscle spasms. 05/10/21   Wanda Plump, MD  naproxen (NAPROSYN) 375 MG tablet Take 1 tablet (375 mg total) by mouth 2 (two) times daily with a meal. 05/06/21   Harris, Cammy Copa, PA-C  Nutritional Supplements (JUICE PLUS FIBRE PO) Take 3 capsules by mouth daily.  Veggie    [provider]  Tetrahydrozoline-Zn Sulfate (EYE DROPS ALLERGY RELIEF OP) Place 1 drop into both eyes daily as needed (allergy).    [provider]  triamcinolone (NASACORT) 55 MCG/ACT AERO nasal inhaler Place 2 sprays into the nose daily. 2 sprays each nostril at night. Patient taking differently: Place 2 sprays into the nose daily as needed (allergies). 12/15/20   Drema Halon, MD    Current Outpatient Medications  Medication Sig Dispense Refill   albuterol (VENTOLIN HFA) 108 (90 Base) MCG/ACT inhaler INHALE TWO PUFFS BY MOUTH EVERY 6 HOURS AS NEEDED FOR WHEEZING OR FOR SHORTNESS OF BREATH 18 g 5   ALPRAZolam (XANAX) 0.25  MG tablet Take 1 tablet (0.25 mg total) by mouth at bedtime as needed for anxiety. 30 tablet 0   Armodafinil 50 MG tablet Take 2 tablets (100 mg total) by mouth daily. 90 tablet 1   OVER THE COUNTER MEDICATION Take 4 capsules by mouth daily. Myo-inositol     acetaminophen (TYLENOL) 500 MG tablet Take 1,000 mg by mouth every 6 (six) hours as needed for mild pain.     ASHWAGANDHA PO Take 1 capsule by mouth daily.     dicyclomine (BENTYL) 10 MG capsule Take 1 capsule (10 mg total) by mouth 3 (three) times daily as needed for spasms. 30 capsule 1   Lactobacillus (PROBIOTIC ACIDOPHILUS PO) Take 1 capsule by mouth daily.     MAGNESIUM CITRATE PO Take 1 tablet by mouth daily.     methocarbamol (ROBAXIN) 500 MG tablet Take 1 tablet (500 mg total) by mouth every 6 (six) hours as needed for muscle spasms. 20 tablet 0   naproxen (NAPROSYN) 375 MG tablet Take 1 tablet (375 mg total) by mouth 2 (two) times daily with a meal. 20 tablet 0   Nutritional Supplements (JUICE PLUS FIBRE PO) Take 3 capsules by mouth daily. Veggie     Tetrahydrozoline-Zn Sulfate (EYE DROPS ALLERGY RELIEF OP) Place 1 drop into both eyes daily as needed (allergy).     triamcinolone (NASACORT) 55 MCG/ACT AERO nasal inhaler Place 2 sprays into the nose daily. 2 sprays each nostril at night. (Patient taking differently: Place 2 sprays into the nose daily as needed (allergies).) 1 each 12   Current Facility-Administered Medications  Medication Dose Route Frequency Provider Last Rate Last Admin   0.9 %  sodium chloride infusion  500 mL Intravenous Once Imogene Burn, MD        Allergies as of 07/08/2021 - Review Complete 07/08/2021  Allergen Reaction Noted   Gluten meal Diarrhea 12/01/2020   Soy allergy Nausea And Vomiting 12/01/2020   Wound dressing adhesive Rash 02/10/2021    Family History  Problem Relation Age of Onset   Breast cancer Mother        dx age late 76s   Asthma Father    Uterine cancer Maternal Aunt    Colon  cancer Other        Colon   Diabetes Other        GGF   Coronary artery disease Other    Congestive Heart Failure Other     Social History   Socioeconomic History   Marital status: Married    Spouse name: marc   Number of children: 2   Years of education: Not on file   Highest education level: Bachelor's degree (e.g., BA, AB, BS)  Occupational History   Occupation: finished college, works-- Tourist information centre manager  Tobacco Use   Smoking  status: Never   Smokeless tobacco: Never  Vaping Use   Vaping Use: Never used  Substance and Sexual Activity   Alcohol use: Yes    Alcohol/week: 0.0 standard drinks    Comment: rarely   Drug use: No   Sexual activity: Yes  Other Topics Concern   Not on file  Social History Narrative   Live with husband 2 daughter - 2016 , 2020   Right handed   Caffeine: 3 cups of coffee a day and 1 tea or soda a day   Social Determinants of Health   Financial Resource Strain: Not on file  Food Insecurity: Not on file  Transportation Needs: Not on file  Physical Activity: Not on file  Stress: Not on file  Social Connections: Not on file  Intimate Partner Violence: Not on file    Physical Exam: Vital signs in last 24 hours: BP 119/65    Pulse 87    Temp 98.4 F (36.9 C)    Ht 5\' 6"  (1.676 m)    Wt 177 lb (80.3 kg)    SpO2 96%    BMI 28.57 kg/m  GEN: NAD EYE: Sclerae anicteric ENT: MMM CV: Non-tachycardic Pulm: No increased WOB GI: Soft NEURO:  Alert & Oriented   Brandi Pontlaire Roxi Hlavaty, MD [facility]   07/08/2021 3:44 PM

## 2021-07-12 ENCOUNTER — Telehealth: Payer: Self-pay

## 2021-07-12 DIAGNOSIS — M9905 Segmental and somatic dysfunction of pelvic region: Secondary | ICD-10-CM | POA: Diagnosis not present

## 2021-07-12 DIAGNOSIS — M9903 Segmental and somatic dysfunction of lumbar region: Secondary | ICD-10-CM | POA: Diagnosis not present

## 2021-07-12 DIAGNOSIS — M9902 Segmental and somatic dysfunction of thoracic region: Secondary | ICD-10-CM | POA: Diagnosis not present

## 2021-07-12 DIAGNOSIS — M9901 Segmental and somatic dysfunction of cervical region: Secondary | ICD-10-CM | POA: Diagnosis not present

## 2021-07-12 NOTE — Telephone Encounter (Signed)
°  Follow up Call-  Call back number 07/08/2021  Post procedure Call Back phone  # [phone]  Permission to leave phone message Yes  Some recent data might be hidden     Patient questions:  Do you have a fever, pain , or abdominal swelling? No. Pain Score  0 *  Have you tolerated food without any problems? Yes.    Have you been able to return to your normal activities? Yes.    Do you have any questions about your discharge instructions: Diet   No. Medications  No. Follow up visit  No.  Do you have questions or concerns about your Care? No.  Actions: * If pain score is 4 or above: No action needed, pain <4.  Have you developed a fever since your procedure? no  2.   Have you had an respiratory symptoms (SOB or cough) since your procedure? no  3.   Have you tested positive for COVID 19 since your procedure no  4.   Have you had any family members/close contacts diagnosed with the COVID 19 since your procedure?  no   If yes to any of these questions please route to Laverna Peace, RN and Karlton Lemon, RN

## 2021-07-12 NOTE — Telephone Encounter (Signed)
Called [phone] and left a message we tried to reach pt for a follow up call. maw

## 2021-07-13 DIAGNOSIS — M542 Cervicalgia: Secondary | ICD-10-CM | POA: Diagnosis not present

## 2021-07-13 DIAGNOSIS — M62838 Other muscle spasm: Secondary | ICD-10-CM | POA: Diagnosis not present

## 2021-07-14 ENCOUNTER — Encounter: Payer: Self-pay | Admitting: Internal Medicine

## 2021-07-14 DIAGNOSIS — M9901 Segmental and somatic dysfunction of cervical region: Secondary | ICD-10-CM | POA: Diagnosis not present

## 2021-07-14 DIAGNOSIS — M9903 Segmental and somatic dysfunction of lumbar region: Secondary | ICD-10-CM | POA: Diagnosis not present

## 2021-07-14 DIAGNOSIS — M9905 Segmental and somatic dysfunction of pelvic region: Secondary | ICD-10-CM | POA: Diagnosis not present

## 2021-07-14 DIAGNOSIS — M9902 Segmental and somatic dysfunction of thoracic region: Secondary | ICD-10-CM | POA: Diagnosis not present

## 2021-07-19 DIAGNOSIS — M9901 Segmental and somatic dysfunction of cervical region: Secondary | ICD-10-CM | POA: Diagnosis not present

## 2021-07-19 DIAGNOSIS — M9902 Segmental and somatic dysfunction of thoracic region: Secondary | ICD-10-CM | POA: Diagnosis not present

## 2021-07-19 DIAGNOSIS — M9903 Segmental and somatic dysfunction of lumbar region: Secondary | ICD-10-CM | POA: Diagnosis not present

## 2021-07-19 DIAGNOSIS — M9905 Segmental and somatic dysfunction of pelvic region: Secondary | ICD-10-CM | POA: Diagnosis not present

## 2021-07-20 DIAGNOSIS — M62838 Other muscle spasm: Secondary | ICD-10-CM | POA: Diagnosis not present

## 2021-07-20 DIAGNOSIS — M542 Cervicalgia: Secondary | ICD-10-CM | POA: Diagnosis not present

## 2021-07-21 DIAGNOSIS — M9905 Segmental and somatic dysfunction of pelvic region: Secondary | ICD-10-CM | POA: Diagnosis not present

## 2021-07-21 DIAGNOSIS — M9902 Segmental and somatic dysfunction of thoracic region: Secondary | ICD-10-CM | POA: Diagnosis not present

## 2021-07-21 DIAGNOSIS — M9903 Segmental and somatic dysfunction of lumbar region: Secondary | ICD-10-CM | POA: Diagnosis not present

## 2021-07-21 DIAGNOSIS — M9901 Segmental and somatic dysfunction of cervical region: Secondary | ICD-10-CM | POA: Diagnosis not present

## 2021-07-22 DIAGNOSIS — M5451 Vertebrogenic low back pain: Secondary | ICD-10-CM | POA: Diagnosis not present

## 2021-07-22 DIAGNOSIS — M542 Cervicalgia: Secondary | ICD-10-CM | POA: Diagnosis not present

## 2021-07-26 ENCOUNTER — Ambulatory Visit: Payer: BC Managed Care – PPO | Admitting: Internal Medicine

## 2021-07-26 DIAGNOSIS — M9902 Segmental and somatic dysfunction of thoracic region: Secondary | ICD-10-CM | POA: Diagnosis not present

## 2021-07-26 DIAGNOSIS — M9901 Segmental and somatic dysfunction of cervical region: Secondary | ICD-10-CM | POA: Diagnosis not present

## 2021-07-26 DIAGNOSIS — M9903 Segmental and somatic dysfunction of lumbar region: Secondary | ICD-10-CM | POA: Diagnosis not present

## 2021-07-26 DIAGNOSIS — M9905 Segmental and somatic dysfunction of pelvic region: Secondary | ICD-10-CM | POA: Diagnosis not present

## 2021-07-27 DIAGNOSIS — M542 Cervicalgia: Secondary | ICD-10-CM | POA: Diagnosis not present

## 2021-07-27 DIAGNOSIS — M62838 Other muscle spasm: Secondary | ICD-10-CM | POA: Diagnosis not present

## 2021-07-28 DIAGNOSIS — M9902 Segmental and somatic dysfunction of thoracic region: Secondary | ICD-10-CM | POA: Diagnosis not present

## 2021-07-28 DIAGNOSIS — M9903 Segmental and somatic dysfunction of lumbar region: Secondary | ICD-10-CM | POA: Diagnosis not present

## 2021-07-28 DIAGNOSIS — M9905 Segmental and somatic dysfunction of pelvic region: Secondary | ICD-10-CM | POA: Diagnosis not present

## 2021-07-28 DIAGNOSIS — M9901 Segmental and somatic dysfunction of cervical region: Secondary | ICD-10-CM | POA: Diagnosis not present

## 2021-08-02 DIAGNOSIS — M9905 Segmental and somatic dysfunction of pelvic region: Secondary | ICD-10-CM | POA: Diagnosis not present

## 2021-08-02 DIAGNOSIS — M9901 Segmental and somatic dysfunction of cervical region: Secondary | ICD-10-CM | POA: Diagnosis not present

## 2021-08-02 DIAGNOSIS — M9903 Segmental and somatic dysfunction of lumbar region: Secondary | ICD-10-CM | POA: Diagnosis not present

## 2021-08-02 DIAGNOSIS — M9902 Segmental and somatic dysfunction of thoracic region: Secondary | ICD-10-CM | POA: Diagnosis not present

## 2021-08-04 DIAGNOSIS — M5481 Occipital neuralgia: Secondary | ICD-10-CM | POA: Diagnosis not present

## 2021-08-04 DIAGNOSIS — M9903 Segmental and somatic dysfunction of lumbar region: Secondary | ICD-10-CM | POA: Diagnosis not present

## 2021-08-04 DIAGNOSIS — M9901 Segmental and somatic dysfunction of cervical region: Secondary | ICD-10-CM | POA: Diagnosis not present

## 2021-08-04 DIAGNOSIS — M9905 Segmental and somatic dysfunction of pelvic region: Secondary | ICD-10-CM | POA: Diagnosis not present

## 2021-08-04 DIAGNOSIS — M9902 Segmental and somatic dysfunction of thoracic region: Secondary | ICD-10-CM | POA: Diagnosis not present

## 2021-08-05 DIAGNOSIS — M542 Cervicalgia: Secondary | ICD-10-CM | POA: Diagnosis not present

## 2021-08-05 DIAGNOSIS — M62838 Other muscle spasm: Secondary | ICD-10-CM | POA: Diagnosis not present

## 2021-08-09 DIAGNOSIS — M9903 Segmental and somatic dysfunction of lumbar region: Secondary | ICD-10-CM | POA: Diagnosis not present

## 2021-08-09 DIAGNOSIS — M9905 Segmental and somatic dysfunction of pelvic region: Secondary | ICD-10-CM | POA: Diagnosis not present

## 2021-08-09 DIAGNOSIS — M9902 Segmental and somatic dysfunction of thoracic region: Secondary | ICD-10-CM | POA: Diagnosis not present

## 2021-08-09 DIAGNOSIS — M9901 Segmental and somatic dysfunction of cervical region: Secondary | ICD-10-CM | POA: Diagnosis not present

## 2021-08-10 DIAGNOSIS — G4733 Obstructive sleep apnea (adult) (pediatric): Secondary | ICD-10-CM | POA: Diagnosis not present

## 2021-08-12 DIAGNOSIS — M542 Cervicalgia: Secondary | ICD-10-CM | POA: Diagnosis not present

## 2021-08-12 DIAGNOSIS — M62838 Other muscle spasm: Secondary | ICD-10-CM | POA: Diagnosis not present

## 2021-08-16 DIAGNOSIS — M542 Cervicalgia: Secondary | ICD-10-CM | POA: Diagnosis not present

## 2021-08-16 DIAGNOSIS — M9902 Segmental and somatic dysfunction of thoracic region: Secondary | ICD-10-CM | POA: Diagnosis not present

## 2021-08-16 DIAGNOSIS — M9901 Segmental and somatic dysfunction of cervical region: Secondary | ICD-10-CM | POA: Diagnosis not present

## 2021-08-16 DIAGNOSIS — M9903 Segmental and somatic dysfunction of lumbar region: Secondary | ICD-10-CM | POA: Diagnosis not present

## 2021-08-16 DIAGNOSIS — M9905 Segmental and somatic dysfunction of pelvic region: Secondary | ICD-10-CM | POA: Diagnosis not present

## 2021-08-17 DIAGNOSIS — M62838 Other muscle spasm: Secondary | ICD-10-CM | POA: Diagnosis not present

## 2021-08-17 DIAGNOSIS — M542 Cervicalgia: Secondary | ICD-10-CM | POA: Diagnosis not present

## 2021-08-23 ENCOUNTER — Ambulatory Visit (INDEPENDENT_AMBULATORY_CARE_PROVIDER_SITE_OTHER): Payer: BC Managed Care – PPO | Admitting: Internal Medicine

## 2021-08-23 ENCOUNTER — Encounter: Payer: Self-pay | Admitting: Internal Medicine

## 2021-08-23 VITALS — BP 120/70 | HR 87 | Ht 66.0 in | Wt 172.0 lb

## 2021-08-23 DIAGNOSIS — K2970 Gastritis, unspecified, without bleeding: Secondary | ICD-10-CM | POA: Diagnosis not present

## 2021-08-23 DIAGNOSIS — K219 Gastro-esophageal reflux disease without esophagitis: Secondary | ICD-10-CM

## 2021-08-23 DIAGNOSIS — K589 Irritable bowel syndrome without diarrhea: Secondary | ICD-10-CM | POA: Diagnosis not present

## 2021-08-23 DIAGNOSIS — K2990 Gastroduodenitis, unspecified, without bleeding: Secondary | ICD-10-CM | POA: Diagnosis not present

## 2021-08-23 NOTE — Progress Notes (Signed)
? ?Chief Complaint: IBS ? ?HPI : 31 year old female with history of IBS, OSA on CPAP, PCOS, and asthma presents for follow up of IBS ? ?Interval History: She feels like the low FODMAP diet has helped her identify soybean oil as the culprit of many of her GI symptoms.  She has been able to restart eating bread that does not contain soybean oil.  She still occasional moments where she has stomach cramps and diarrhea, but this is still much improved from prior. Bentyl PRN to help with severe episodes of abdominal discomfort.  She did also do a trial of MiraLAX where she took it consistently for a few days.  She is now taking the MiraLAX PRN. She takes omeprazole PRN before she eats really spicy emals.  Denies melena.  She is quite pleased with how she feels in terms of her GI symptoms currently. ? ?Current Outpatient Medications  ?Medication Sig Dispense Refill  ? acetaminophen (TYLENOL) 500 MG tablet Take 1,000 mg by mouth every 6 (six) hours as needed for mild pain.    ? albuterol (VENTOLIN HFA) 108 (90 Base) MCG/ACT inhaler INHALE TWO PUFFS BY MOUTH EVERY 6 HOURS AS NEEDED FOR WHEEZING OR FOR SHORTNESS OF BREATH 18 g 5  ? ALPRAZolam (XANAX) 0.25 MG tablet Take 1 tablet (0.25 mg total) by mouth at bedtime as needed for anxiety. 30 tablet 0  ? Armodafinil 50 MG tablet Take 2 tablets (100 mg total) by mouth daily. 90 tablet 1  ? dicyclomine (BENTYL) 10 MG capsule Take 1 capsule (10 mg total) by mouth 3 (three) times daily as needed for spasms. 30 capsule 1  ? Lactobacillus (PROBIOTIC ACIDOPHILUS PO) Take 1 capsule by mouth daily.    ? MAGNESIUM CITRATE PO Take 1 tablet by mouth daily.    ? Nutritional Supplements (JUICE PLUS FIBRE PO) Take 3 capsules by mouth daily. Veggie    ? omeprazole (PRILOSEC) 40 MG capsule Take 1 capsule (40 mg total) by mouth 2 (two) times daily before a meal. 120 capsule 0  ? OVER THE COUNTER MEDICATION Take 4 capsules by mouth daily. Myo-inositol    ? Tetrahydrozoline-Zn Sulfate (EYE DROPS  ALLERGY RELIEF OP) Place 1 drop into both eyes daily as needed (allergy).    ? triamcinolone (NASACORT) 55 MCG/ACT AERO nasal inhaler Place 2 sprays into the nose daily. 2 sprays each nostril at night. (Patient taking differently: Place 2 sprays into the nose daily as needed (allergies).) 1 each 12  ? ?No current facility-administered medications for this visit.  ? ?Review of Systems: ?All systems reviewed and negative except where noted in HPI.  ? ?Physical Exam: ?BP 120/70   Pulse 87   Ht 5\' 6"  (1.676 m)   Wt 172 lb (78 kg)   BMI 27.76 kg/m?  ?Constitutional: Pleasant,well-developed, female in no acute distress. ?HEENT: Normocephalic and atraumatic. Conjunctivae are normal. No scleral icterus. ?Cardiovascular: Normal rate, regular rhythm.  ?Pulmonary/chest: Effort normal and breath sounds normal. No wheezing, rales or rhonchi. ?Abdominal: Soft, mildly distended, tender in the lower abdomen with some very mild tenderness in the epigastric area. Bowel sounds active throughout. There are no masses palpable. No hepatomegaly. ?Extremities: No edema ?Neurological: Alert and oriented to person place and time. ?Skin: Skin is warm and dry. No rashes noted. ?Psychiatric: Normal mood and affect. Behavior is normal. ? ?Labs 04/2021: CBC nml. CMP with mildly decreased potassium of 3.4. Urine pregnancy test negative.  ? ?CT C/A/P w/contrast 05/06/21: ?IMPRESSION: ?There is no evidence  of mediastinal hematoma. There is no focal ?pulmonary consolidation. There is no pleural effusion or ?pneumothorax. There is no evidence of ascites or pneumoperitoneum. ?There is no laceration of solid organs. There is no bowel wall ?thickening. No displaced fractures are seen. ? ?EGD 07/08/21: ?- White nummular lesions in esophageal mucosa. Biopsied. ?- Gastritis. Biopsied. ?- Normal examined duodenum. Biopsied. ?Path: ?1. Surgical [P], duodenal bulb, 2nd portion of duodenum and distal duodenum ?- DUODENAL MUCOSA WITH NO SPECIFIC  HISTOPATHOLOGIC CHANGES ?- NEGATIVE FOR INCREASED INTRAEPITHELIAL LYMPHOCYTES OR VILLOUS ARCHITECTURAL CHANGES ?2. Surgical [P], gastric antrum and gastric body ?- GASTRIC ANTRAL AND OXYNTIC MUCOSA WITH NO SPECIFIC HISTOPATHOLOGIC CHANGES ?- HELICOBACTER PYLORI-LIKE ORGANISMS ARE NOT IDENTIFIED ON ROUTINE H&E STAIN ?3. Surgical [P], mid esophagus and distal esophagus ?- ESOPHAGEAL SQUAMOUS MUCOSA WITH MILD VASCULAR CONGESTION, AND FOCAL SQUAMOUS BALLOONING, ?SUGGESTIVE OF REFLUX ESOPHAGITIS ?- NEGATIVE FOR INCREASED INTRAEPITHELIAL EOSINOPHILS ? ?ASSESSMENT AND PLAN: ?IBS ?GERD ?Patient has had significant improvement in her GI symptoms by noting specific foods such as soybean oil that appear to be making her symptoms worse.  Bentyl as needed and MiraLAX as needed seem to be also helping with her symptoms.  Patient has been using PPI as needed.  I encouraged her to take her PPI in a scheduled manner since this will optimize her acid reflux control.  Patient can eventually switch to H2 blocker therapy if she feels like she only needs an as needed medication instead of a scheduled medication for reflux.  Overall patient is happy with where she is right now, and she can return to GI clinic as needed ?- Low FODMAP diet ?- Bentyl PRN.  ?- Miralax PRN ?- Take PPI QD 30 minutes before dinner. Can switch to H2 blocker in the future if she would like to take a PRN medication ?- RTC PRN. Consider pelvic PT or RUQ U/S in the future if patient has recurrent issues  ? ?Eulah Pont, MD ? ?I spent 31 minutes of time, including in depth chart review, independent review of results as outlined above, communicating results with the patient directly, face-to-face time with the patient, coordinating care, and ordering studies and medications as appropriate, and documentation. ? ?

## 2021-08-23 NOTE — Patient Instructions (Addendum)
Glad you are feeling better.  ? ?You can decrease your omeprazole to once daily 30 minutes prior to dinner.  ? ?Follow up as needed. ? ?The [facility] providers would like to encourage you to use [facility] to communicate with providers for non-urgent requests or questions.  Due to long hold times on the telephone, sending your provider a message by [facility] may be a faster and more efficient way to get a response.  Please allow 48 business hours for a response.  Please remember that this is for non-urgent requests.  ? ?

## 2021-08-24 DIAGNOSIS — M542 Cervicalgia: Secondary | ICD-10-CM | POA: Diagnosis not present

## 2021-08-24 DIAGNOSIS — M62838 Other muscle spasm: Secondary | ICD-10-CM | POA: Diagnosis not present

## 2021-08-25 DIAGNOSIS — M9901 Segmental and somatic dysfunction of cervical region: Secondary | ICD-10-CM | POA: Diagnosis not present

## 2021-08-25 DIAGNOSIS — M9905 Segmental and somatic dysfunction of pelvic region: Secondary | ICD-10-CM | POA: Diagnosis not present

## 2021-08-25 DIAGNOSIS — M9902 Segmental and somatic dysfunction of thoracic region: Secondary | ICD-10-CM | POA: Diagnosis not present

## 2021-08-25 DIAGNOSIS — M9903 Segmental and somatic dysfunction of lumbar region: Secondary | ICD-10-CM | POA: Diagnosis not present

## 2021-09-06 ENCOUNTER — Emergency Department (HOSPITAL_BASED_OUTPATIENT_CLINIC_OR_DEPARTMENT_OTHER)
Admission: EM | Admit: 2021-09-06 | Discharge: 2021-09-06 | Disposition: A | Discharge status: Home / Self Care | Payer: BC Managed Care – PPO | Attending: Emergency Medicine | Admitting: Emergency Medicine

## 2021-09-06 ENCOUNTER — Encounter (HOSPITAL_BASED_OUTPATIENT_CLINIC_OR_DEPARTMENT_OTHER): Payer: Self-pay

## 2021-09-06 ENCOUNTER — Encounter: Payer: Self-pay | Admitting: Neurology

## 2021-09-06 ENCOUNTER — Other Ambulatory Visit: Payer: Self-pay

## 2021-09-06 DIAGNOSIS — R21 Rash and other nonspecific skin eruption: Secondary | ICD-10-CM | POA: Diagnosis not present

## 2021-09-06 DIAGNOSIS — R002 Palpitations: Secondary | ICD-10-CM | POA: Insufficient documentation

## 2021-09-06 DIAGNOSIS — R0600 Dyspnea, unspecified: Secondary | ICD-10-CM | POA: Diagnosis not present

## 2021-09-06 DIAGNOSIS — T43605A Adverse effect of unspecified psychostimulants, initial encounter: Secondary | ICD-10-CM | POA: Diagnosis not present

## 2021-09-06 DIAGNOSIS — J45909 Unspecified asthma, uncomplicated: Secondary | ICD-10-CM | POA: Diagnosis not present

## 2021-09-06 DIAGNOSIS — T50905A Adverse effect of unspecified drugs, medicaments and biological substances, initial encounter: Secondary | ICD-10-CM

## 2021-09-06 DIAGNOSIS — G47411 Narcolepsy with cataplexy: Secondary | ICD-10-CM

## 2021-09-06 LAB — BASIC METABOLIC PANEL
Anion gap: 9 (ref 5–15)
BUN: 11 mg/dL (ref 6–20)
CO2: 24 mmol/L (ref 22–32)
Calcium: 9.4 mg/dL (ref 8.9–10.3)
Chloride: 105 mmol/L (ref 98–111)
Creatinine, Ser: 0.79 mg/dL (ref 0.44–1.00)
GFR, Estimated: >60 mL/min (ref >60)
Glucose, Bld: 121 mg/dL — ABNORMAL HIGH (ref 70–99)
Potassium: 3.6 mmol/L (ref 3.5–5.1)
Sodium: 138 mmol/L (ref 135–145)

## 2021-09-06 LAB — CBC
HCT: 38.8 % (ref 36.0–46.0)
Hemoglobin: 13.7 g/dL (ref 12.0–15.0)
MCH: 30.6 pg (ref 26.0–34.0)
MCHC: 35.3 g/dL (ref 30.0–36.0)
MCV: 86.6 fL (ref 80.0–100.0)
Platelets: 207 10*3/uL (ref 150–400)
RBC: 4.48 MIL/uL (ref 3.87–5.11)
RDW: 12.6 % (ref 11.5–15.5)
WBC: 3.8 10*3/uL — ABNORMAL LOW (ref 4.0–10.5)
nRBC: 0 % (ref 0.0–0.2)

## 2021-09-06 LAB — TROPONIN I (HIGH SENSITIVITY)
Troponin I (High Sensitivity): 2 ng/L (ref <18)
Troponin I (High Sensitivity): 3 ng/L (ref <18)

## 2021-09-06 LAB — PREGNANCY, URINE: Preg Test, Ur: NEGATIVE

## 2021-09-06 NOTE — ED Provider Notes (Incomplete)
?[facility] EMERGENCY DEPARTMENT ?Provider Note ? ? ?CSN: [id number] ?Arrival date & time: 09/06/21  1037 ? ?  ? ?History ?{Add pertinent medical, surgical, social history, OB history to HPI:1} ?Chief Complaint  ?Patient presents with  ? Palpitations  ? ? ?Brandi Hall is a 31 y.o. female. ? ?HPI ? ?  ? ? ?31 year old female with history of IBS, OSA on CPAP, PCOS, and asthma  ?Home Medications ?Prior to Admission medications   ?Medication Sig Start Date End Date Taking? Authorizing Provider  ?acetaminophen (TYLENOL) 500 MG tablet Take 1,000 mg by mouth every 6 (six) hours as needed for mild pain.    [provider]  ?albuterol (VENTOLIN HFA) 108 (90 Base) MCG/ACT inhaler INHALE TWO PUFFS BY MOUTH EVERY 6 HOURS AS NEEDED FOR WHEEZING OR FOR SHORTNESS OF BREATH 02/22/21   Wanda Plump, MD  ?ALPRAZolam Prudy Feeler) 0.25 MG tablet Take 1 tablet (0.25 mg total) by mouth at bedtime as needed for anxiety. 07/05/21   Shawnie Dapper, NP  ?Armodafinil 50 MG tablet Take 2 tablets (100 mg total) by mouth daily. 07/05/21   Lomax, Amy, NP  ?dicyclomine (BENTYL) 10 MG capsule Take 1 capsule (10 mg total) by mouth 3 (three) times daily as needed for spasms. 05/25/21   Imogene Burn, MD  ?Lactobacillus (PROBIOTIC ACIDOPHILUS PO) Take 1 capsule by mouth daily.    [provider]  ?MAGNESIUM CITRATE PO Take 1 tablet by mouth daily.    [provider]  ?Nutritional Supplements (JUICE PLUS FIBRE PO) Take 3 capsules by mouth daily. Veggie    [provider]  ?omeprazole (PRILOSEC) 40 MG capsule Take 1 capsule (40 mg total) by mouth 2 (two) times daily before a meal. 07/08/21 09/06/21  Imogene Burn, MD  ?OVER THE COUNTER MEDICATION Take 4 capsules by mouth daily. Myo-inositol    [provider]  ?Tetrahydrozoline-Zn Sulfate (EYE DROPS ALLERGY RELIEF OP) Place 1 drop into both eyes daily as needed (allergy).    [provider]  ?triamcinolone (NASACORT) 55 MCG/ACT AERO nasal inhaler  Place 2 sprays into the nose daily. 2 sprays each nostril at night. ?Patient taking differently: Place 2 sprays into the nose daily as needed (allergies). 12/15/20   Drema Halon, MD  ?   ? ?Allergies    ?Gluten meal, Soy allergy, and Wound dressing adhesive   ? ?Review of Systems   ?Review of Systems ? ?Physical Exam ?Updated Vital Signs ?BP 115/64   Pulse 77   Temp 98.2 ?F (36.8 ?C) (Oral)   Resp 14   Ht 5\' 6"  (1.676 m)   Wt 77.1 kg   SpO2 95%   BMI 27.44 kg/m?  ?Physical Exam ? ?ED Results / Procedures / Treatments   ?Labs ?(all labs ordered are listed, but only abnormal results are displayed) ?Labs Reviewed  ?BASIC METABOLIC PANEL - Abnormal; Notable for the following components:  ?    Result Value  ? Glucose, Bld 121 (*)   ? All other components within normal limits  ?CBC - Abnormal; Notable for the following components:  ? WBC 3.8 (*)   ? All other components within normal limits  ?PREGNANCY, URINE  ?TROPONIN I (HIGH SENSITIVITY)  ?TROPONIN I (HIGH SENSITIVITY)  ? ? ?EKG ?EKG Interpretation ? ?Date/Time:  Tuesday September 06 2021 10:48:25 EDT ?Ventricular Rate:  86 ?PR Interval:  120 ?QRS Duration: 76 ?QT Interval:  346 ?QTC Calculation: 414 ?R Axis:   83 ?Text Interpretation: Sinus  rhythm with marked sinus arrhythmia Otherwise normal ECG When compared with ECG of 01-Dec-2020 17:28, No significant change since last tracing Confirmed by Alvira Monday ([id number]) on 09/06/2021 12:38:02 PM ? ?Radiology ?No results found. ? ?Procedures ?Procedures  ?{Document cardiac monitor, telemetry assessment procedure when appropriate:1} ? ?Medications Ordered in ED ?Medications - No data to display ? ?ED Course/ Medical Decision Making/ A&P ?  ?                        ?Medical Decision Making ?Amount and/or Complexity of Data Reviewed ?Labs: ordered. ? ? ?*** ? ?{Document critical care time when appropriate:1} ?{Document review of labs and clinical decision tools ie heart score, Chads2Vasc2 etc:1}  ?{Document your  independent review of radiology images, and any outside records:1} ?{Document your discussion with family members, caretakers, and with consultants:1} ?{Document social determinants of health affecting pt's care:1} ?{Document your decision making why or why not admission, treatments were needed:1} ?Final Clinical Impression(s) / ED Diagnoses ?Final diagnoses:  ?None  ? ? ?Rx / DC Orders ?ED Discharge Orders   ? ? None  ? ?  ? ? ?

## 2021-09-06 NOTE — ED Triage Notes (Signed)
States started having palpitations this morning, became flushed, shakes, shortness of breath. Taking armodafinil since January, hasnt had any issues before. NAD during triage.  ?

## 2021-09-06 NOTE — ED Provider Notes (Signed)
?[facility] EMERGENCY DEPARTMENT ?Provider Note ? ? ?CSN: [id number] ?Arrival date & time: 09/06/21  1037 ? ?  ? ?History ? ?Chief Complaint  ?Patient presents with  ? Palpitations  ? ? ?Brandi Hall is a 31 y.o. female. ? ?HPI ? ?  ? ?30 minutes after taking medication began to start ?Armodafinil began in January ? ? ?HR to 150, palpitations, dyspnea ?HR started to decrease, teeth chattering, felt cold ?Spots on stomach on back ?No chest pain ?Legs, hands, face, swollen, red face ?Lasted 30-45 minutes ? ?No long trips, recent surgeries, hospitalization, November last ?No hx of DVT/PE, on OCP ?Not sure family hx of dvt ? ?No fever, cough, vomiting, diarrhea, black or bloody stools ?Seeing GI  ? ?Home Medications ?Prior to Admission medications   ?Medication Sig Start Date End Date Taking? Authorizing Provider  ?acetaminophen (TYLENOL) 500 MG tablet Take 1,000 mg by mouth every 6 (six) hours as needed for mild pain.    [provider]  ?albuterol (VENTOLIN HFA) 108 (90 Base) MCG/ACT inhaler INHALE TWO PUFFS BY MOUTH EVERY 6 HOURS AS NEEDED FOR WHEEZING OR FOR SHORTNESS OF BREATH 02/22/21   Colon Branch, MD  ?ALPRAZolam Duanne Moron) 0.25 MG tablet Take 1 tablet (0.25 mg total) by mouth at bedtime as needed for anxiety. 07/05/21   Debbora Presto, NP  ?Armodafinil 50 MG tablet Take 2 tablets (100 mg total) by mouth daily. 07/05/21   Lomax, Amy, NP  ?dicyclomine (BENTYL) 10 MG capsule Take 1 capsule (10 mg total) by mouth 3 (three) times daily as needed for spasms. 05/25/21   Sharyn Creamer, MD  ?Lactobacillus (PROBIOTIC ACIDOPHILUS PO) Take 1 capsule by mouth daily.    [provider]  ?MAGNESIUM CITRATE PO Take 1 tablet by mouth daily.    [provider]  ?Nutritional Supplements (JUICE PLUS FIBRE PO) Take 3 capsules by mouth daily. Veggie    [provider]  ?omeprazole (PRILOSEC) 40 MG capsule Take 1 capsule (40 mg total) by mouth 2 (two) times daily before a meal. 07/08/21 09/06/21   Sharyn Creamer, MD  ?OVER THE COUNTER MEDICATION Take 4 capsules by mouth daily. Myo-inositol    [provider]  ?Tetrahydrozoline-Zn Sulfate (EYE DROPS ALLERGY RELIEF OP) Place 1 drop into both eyes daily as needed (allergy).    [provider]  ?triamcinolone (NASACORT) 55 MCG/ACT AERO nasal inhaler Place 2 sprays into the nose daily. 2 sprays each nostril at night. ?Patient taking differently: Place 2 sprays into the nose daily as needed (allergies). 12/15/20   Rozetta Nunnery, MD  ?   ? ?Allergies    ?Gluten meal, Soy allergy, and Wound dressing adhesive   ? ?Review of Systems   ?Review of Systems ?See above ? ?Physical Exam ?Updated Vital Signs ?BP 115/64   Pulse 77   Temp 98.2 ?F (36.8 ?C) (Oral)   Resp 14   Ht 5\' 6"  (1.676 m)   Wt 77.1 kg   SpO2 95%   BMI 27.44 kg/m?  ?Physical Exam ?Vitals and nursing note reviewed.  ?Constitutional:   ?   General: She is not in acute distress. ?   Appearance: She is well-developed. She is not diaphoretic.  ?HENT:  ?   Head: Normocephalic and atraumatic.  ?Eyes:  ?   Conjunctiva/sclera: Conjunctivae normal.  ?Cardiovascular:  ?   Rate and Rhythm: Normal rate and regular rhythm.  ?   Heart sounds: Normal heart sounds. No murmur heard. ?  No friction rub. No gallop.  ?Pulmonary:  ?   Effort: Pulmonary effort is normal. No respiratory distress.  ?   Breath sounds: Normal breath sounds. No wheezing or rales.  ?Abdominal:  ?   General: There is no distension.  ?   Palpations: Abdomen is soft.  ?   Tenderness: There is no abdominal tenderness. There is no guarding.  ?Musculoskeletal:     ?   General: No tenderness.  ?   Cervical back: Normal range of motion.  ?Skin: ?   General: Skin is warm and dry.  ?   Findings: No erythema or rash.  ?   Comments: Very few scattered pink papules over abdomen and back  ?Neurological:  ?   Mental Status: She is alert and oriented to person, place, and time.  ? ? ?ED Results / Procedures / Treatments   ?Labs ?(all  labs ordered are listed, but only abnormal results are displayed) ?Labs Reviewed  ?BASIC METABOLIC PANEL - Abnormal; Notable for the following components:  ?    Result Value  ? Glucose, Bld 121 (*)   ? All other components within normal limits  ?CBC - Abnormal; Notable for the following components:  ? WBC 3.8 (*)   ? All other components within normal limits  ?PREGNANCY, URINE  ?TROPONIN I (HIGH SENSITIVITY)  ?TROPONIN I (HIGH SENSITIVITY)  ? ? ?EKG ?EKG Interpretation ? ?Date/Time:  Tuesday September 06 2021 10:48:25 EDT ?Ventricular Rate:  86 ?PR Interval:  120 ?QRS Duration: 76 ?QT Interval:  346 ?QTC Calculation: 414 ?R Axis:   83 ?Text Interpretation: Sinus rhythm with marked sinus arrhythmia Otherwise normal ECG When compared with ECG of 01-Dec-2020 17:28, No significant change since last tracing Confirmed by Gareth Morgan [phone]) on 09/06/2021 12:38:02 PM ? ?Radiology ?No results found. ? ?Procedures ?Procedures  ? ? ?Medications Ordered in ED ?Medications - No data to display ? ?ED Course/ Medical Decision Making/ A&P ?  ?                        ?Medical Decision Making ?Amount and/or Complexity of Data Reviewed ?Labs: ordered. ? ? ?  ?31 year old female with history of IBS, OSA on CPAP, PCOS, and asthma who presents with concern for episode of palpitations, rash, dyspnea, swelling of face, hands, legs.   ? ?Symptoms resolved at time of my evaluation, and not having continued findings to indicate anaphylaxis. EKG shows sinus rhythm without other acute findings.  Labs evaluated and interpreted by me show no significant anemia, electrolyte abnormality, negative pregnancy test.  Troponin negative x2 and doubt ACS or myocarditis. Discussed possibility of obtaining ddimer but combination of symptoms with rash, full body swelling, with resolution clinically not consistent with PE.  Consider possible cardiac arrhythmia with palpitations and other secondary symptoms that resolved prior to presentation.  Given timing  of it with taking armodafinil, consider this as possible side effect. This medication has been associated with anaphylactoid type reactions  and palpitations. Recommend discontinuing and discussing with her physician. ? ?  ? ? ? ? ? ? ? ?Final Clinical Impression(s) / ED Diagnoses ?Final diagnoses:  ?Palpitations  ?Adverse effect of drug, initial encounter  ? ? ?Rx / DC Orders ?ED Discharge Orders   ? ? None  ? ?  ? ? ?  ?Gareth Morgan, MD ?09/07/21 [phone] ? ?

## 2021-09-07 ENCOUNTER — Ambulatory Visit (INDEPENDENT_AMBULATORY_CARE_PROVIDER_SITE_OTHER): Payer: BC Managed Care – PPO | Admitting: Neurology

## 2021-09-07 ENCOUNTER — Encounter: Payer: Self-pay | Admitting: Neurology

## 2021-09-07 ENCOUNTER — Other Ambulatory Visit: Payer: Self-pay | Admitting: Neurology

## 2021-09-07 VITALS — BP 115/73 | HR 80 | Ht 66.0 in | Wt 173.5 lb

## 2021-09-07 DIAGNOSIS — G47411 Narcolepsy with cataplexy: Secondary | ICD-10-CM

## 2021-09-07 DIAGNOSIS — Z9989 Dependence on other enabling machines and devices: Secondary | ICD-10-CM | POA: Diagnosis not present

## 2021-09-07 DIAGNOSIS — G4733 Obstructive sleep apnea (adult) (pediatric): Secondary | ICD-10-CM | POA: Diagnosis not present

## 2021-09-07 DIAGNOSIS — M542 Cervicalgia: Secondary | ICD-10-CM | POA: Diagnosis not present

## 2021-09-07 DIAGNOSIS — R9089 Other abnormal findings on diagnostic imaging of central nervous system: Secondary | ICD-10-CM | POA: Diagnosis not present

## 2021-09-07 DIAGNOSIS — M62838 Other muscle spasm: Secondary | ICD-10-CM | POA: Diagnosis not present

## 2021-09-07 DIAGNOSIS — T50905A Adverse effect of unspecified drugs, medicaments and biological substances, initial encounter: Secondary | ICD-10-CM

## 2021-09-07 MED ORDER — SUNOSI 150 MG PO TABS: 150.0000 mg | ORAL_TABLET | Freq: Every day | ORAL | 5 refills | Status: DC

## 2021-09-07 MED ORDER — SUNOSI 150 MG PO TABS
0.5000 | ORAL_TABLET | Freq: Every day | ORAL | 0 refills | Status: DC
Start: 2021-09-07 — End: 2021-09-29

## 2021-09-07 NOTE — Progress Notes (Signed)
? ? ?SLEEP MEDICINE CLINIC ?  ? ?Provider:  Larey Seat, MD  ?Primary Care Physician:  Colon Branch, MD ?[address] ?[place] [address]  ? ?  ?Referring Provider: Colon Branch, Md ?[address] ?[address] ?[place],  [place] [address]  ?  ?  ?    ?Chief Complaint according to patient   ?Patient presents with:  ?  ? New Patient (Initial Visit)  ?   Internal referral for abnormal brain MRI. Pt went to the ER for dizziness. ER f/u. Seen at the ER for heart palpitation. Here for possible medication reaction to armodafinil.   ?  ?  ?HISTORY OF PRESENT ILLNESS: ?Brandi Hall is a 31 y.o. Caucasian female patient and Rv after ED 09/07/2021: ?Patient with dizziness spells, OSA and suspected recent reaction to Modafinil. She is the mother of a 52 and a 22 year old daughter-  ?Every day the patient has taken modafinil since January of this year and has taken it every day in the morning she was last seen by Debbora Presto most practitioner on 05 July 2021 for the primary diagnosis of persistent hypersomnia while having obstructive sleep apnea treated on CPAP.  ? Narcolepsy with cataplexy was the working diagnosis until then.  She was treated for the first time with modafinil and had taken it every day until on the morning of 21 March he has not she noted that she became flushed very red her skin color changed she felt as if blood was pooling in her extremities she developed shakes, she developed shortness of breath and palpitations her smart watch showed her that her heart rate was 130, she had not had previous issues with armodafinil.  She was reporting that she had some word finding trouble she struggled to find words but she was also stuttering or stammering.  She felt however fully oriented.Marland Kitchen  She was highly anxious of course during the spell.  She presented to the ED at 10:40 AM all that at least when the triage nurse and noted her.  ? She had an EKG 12-lead which actually shows normal sinus  rhythm.  ? ? Heart rate was 86 in the emergency room EKG was not abnormal basic metabolic panel was obtained CBC was obtained for safety reasons a pregnancy test was obtained and troponin she was discharged at 1400 hrs.  She has not taken armodafinil today however I am concerned that this is not a typical expected side effect only known side effect I have seen patients with sinus tachycardia, I do not ever remember somebody having word finding difficulties or any kind of disorientation or confusion on Armodafinil on a  low-dose 50 mg.  ? ?She took the medication at 9 am , had coffee after , but only a few sips.  ? ?Labs in ED show low WBC.  ? ? ? ? ? ? ? 2022: Consult by dr Larose Kells. ?Internal referral for abnormal brain MRI. Pt went to the ER for dizziness. Vertigo- feeling as if she would tumble over, forward, not a spinning -rotating sensation.  ?ED On 12/01/20. Dizziness from possible positional vertigo. BP has been normal. Pt taking meclizine. Pt had nasal nasalpasty on 96 and has not been able to use CPAP recenltey. I had tested the patient for narcolepsy, HLA was negative, but sleep study had demonstrated OSA and she has been using auto pap for 6-16 cm water,  ?AHI is 0.2/h ! 95%6.1  cm water, no air leak.  She has been using CPAP with a 85% compliance.  ?[name] had undergone a nasal septal plasty on 6 September and she is now rather congested.  I advised her not to use the CPAP for another 10 to 14 days until the swelling has come down.  She can use Afrin but I rather have her use a nondecongestant nasal spray so that there is no rebound effect.  The rebound effect is based on the vasoconstricting mechanisms and decongestants but also can lead to high blood pressure peaks.  They may also cause some dizziness or even pulsatile tinnitus tinnitus.  So she was seen in the emergency room for dizziness just in June and her CT of the head was a normal noncontrast CT it was followed by an MRI of the which was read as  showing no acute infarct but multifocal hyperintense T2 weighted signal within the white matter.  No atrophy was noted this would not be typical for her age to present with chronic microvascular ischemia.  He can see that with advanced diabetes or a longstanding history of vascular headaches and migraines.  So her primary care physician asked her also to see me because of these spells.  ? ? ? ? ? ? ?Chief concern according to patient : hypersomnia:  ?  ?I have the pleasure of seeing [name] toda 06-30-2020, a right -handed female with a reported history of hypersomnia, onset over 10 years ago. She started drinking coffee at age 72 to combat  Asthma, and sleepiness.  Mrs. Brandi Hall also reports that she was excessively sleepy when she couldn't drink caffeine and she named her pregnancies as an example.  She endorsed the Epworth sleepiness score at a very high rate of 19 out of 24 possible points, and stated that she is always taking naps and feels that she needs to sleep more.  She always feels tired. She had Covid 19 in February in 2021- and she was sick, headaches, and fatigue.  Her whole family got it. She has been told that she snores she has suffered from anxiety if there is also some joint pain that may contribute to some discomfort overnight and the level of decreased energy in daytime. She takes 1-2 hours naps, not power naps.  ?She dreams even in naps, has had sleep paralysis, dreams are very vivid , terrifying her. She sleep talks. She is restless, moves frequently in sleep.  ?She  has a past medical history of Allergic rhinitis, Anxiety,  Allergic Asthma, Lymph node abscess, Post term pregnancy, 41 weeks (12/18/2014), and Postpartum care following vaginal delivery (7/1) (12/18/2014). ?  ?  ?Sleep relevant medical history: allergic rhinitis and asthma, hypersomnia, excessive daytime sleepiness and vivid dreams. Cataplectic attacks, knees buckle. Dream intrusions.  ?  ? Family medical /sleep history: MGF on  CPAP with OSA, mother and sister have hypersomnia.   ?  ?Social history: Patient is working as a Medical laboratory scientific officer- and lives in a household with husband and 2 daughters, and one cat.  ?. ?Tobacco use; none .  ETOH use ; none ,  ?Caffeine intake in form of Coffee(2 cups in AM and cold brew  2 cups in PM) Soda( /). ?Regular exercise in form of dancing     ?  ?Sleep habits are as follows: The patient's dinner time is between 7-8 PM. The patient goes to bed at 9 PM and continues to sleep for 8-9 hours, she does not wakes  for  bathroom breaks. The preferred sleep position is in fetal position, with the support of 1-2 pillows. Dreams are reportedly frequent/vivid  ?8 AM is the usual rise time.The patient wakes up with great difficulty, she sleeps through multiple alarms.  ?She reports not feeling refreshed or restored in AM, with symptoms such as dry mouth morning headaches, and residual fatigue. Naps are taken frequently, lasting 1-2 hours.   ?  ?Review of Systems: ?Out of a complete 14 system review, the patient complains of only the following symptoms, and all other reviewed systems are negative.:  ?Fatigue, sleepiness , snoring, vivid dreams, sleep paralysis, cataplectic attacks.  ?How likely are you to doze in the following situations: ?0 = not likely, 1 = slight chance, 2 = moderate chance, 3 = high chance ?  ?Sitting and Reading? ?Watching Television? ?Sitting inactive in a public place (theater or meeting)? ?As a passenger in a car for an hour without a break? ?Lying down in the afternoon when circumstances permit? ?Sitting and talking to someone? ?Sitting quietly after lunch without alcohol? ?In a car, while stopped for a few minutes in traffic? ?  ?Total = 12/ 24 points  ? FSS endorsed at 63/ 63 points.  ? ? My nurse today undertook the effort to do an orthostatic blood pressure measurement in a lying position the patient had 128/78 mmHg blood pressure with a heart rate of 91 she felt actually slightly dizzy just  lying down.  In a seated position the blood pressure was only attached at 230/84 and her heart rate was reduced initially 90 bpm regular but she felt slightly dizzy.  Standing blood pressure increased further 2

## 2021-09-07 NOTE — Patient Instructions (Addendum)
Solriamfetol Oral Tablets ?What is this medication? ?SOLRIAMFETOL (sol ri AM fe tol) is a medication known as a dopamine and norepinephrine reuptake inhibitor (DNRI). It is used to treat excessive sleepiness caused by certain sleep disorders including narcolepsy and obstructive sleep apnea. ?This medicine may be used for other purposes; ask your health care provider or pharmacist if you have questions. ?COMMON BRAND NAME(S): SUNOSI ?What should I tell my care team before I take this medication? ?They need to know if you have any of these conditions: ?bipolar disorder ?diabetes ?heart disease ?high blood pressure ?high cholesterol ?history of drug abuse or alcohol abuse problem ?history of stroke ?kidney disease ?schizophrenia ?an unusual or allergic reaction to solriamfetol, other medicines, foods, dyes, or preservatives ?pregnant or trying to get pregnant ?breast-feeding ?How should I use this medication? ?Take this medicine by mouth with water. Take it as directed on the prescription label at the same time every day. You can take it with or without food. If it upsets your stomach, take it with food. Keep taking it unless your health care provider tells you to stop. ?A special MedGuide will be given to you by the pharmacist with each prescription and refill. Be sure to read this information carefully each time. ?Talk to your health care provider about the use of this medicine in children. Special care may be needed. ?Overdosage: If you think you have taken too much of this medicine contact a poison control center or emergency room at once. ?NOTE: This medicine is only for you. Do not share this medicine with others. ?What if I miss a dose? ?If you miss a dose, take it as soon as you can. However, avoid taking it within 9 hours of your planned bedtime, since you may find it harder to go to sleep. If it is almost time for your next dose, take only that dose. Do not take double or extra doses. ?What may interact with  this medication? ?Do not take this medicine with any of the following medications: ?MAOIs like Carbex, Eldepryl, Marplan, Nardil, and Parnate ?This medicine may also interact with the following medications: ?certain medicines for Parkinson's disease like levodopa, pramipexole, or ropinirole ?medicines that increase blood pressure or heart rate ?This list may not describe all possible interactions. Give your health care provider a list of all the medicines, herbs, non-prescription drugs, or dietary supplements you use. Also tell them if you smoke, drink alcohol, or use illegal drugs. Some items may interact with your medicine. ?What should I watch for while using this medication? ?Visit your health care provider for regular checks on your progress. Tell your health care provider if your symptoms do not start to get better or if they get worse. ?This medicine has a risk of abuse and dependence. Your health care provider will check you for this while you take this medicine. ?What side effects may I notice from receiving this medication? ?Side effects that you should report to your doctor or health care professional as soon as possible: ?allergic reactions (skin rash, itching, and hives; swelling of the face, lips, or tongue) ?anxiety ?changes in emotions or moods ?decreased need for sleep ?elevated mood ?impulsive behavior ?fast, irregular heartbeat ?hallucinations ?increase in blood pressure ?irritability ?loss of contact with reality ?pain or tightness in the chest, neck, back, or arms ?stroke (changes in vision; confusion; trouble speaking or understanding; severe headaches; sudden numbness or weakness of the face, arm or leg; trouble walking; dizziness; loss of balance or coordination) ?Side effects that   usually do not require medical attention (report these to your doctor or health care professional if they continue or are bothersome): ?headache ?dry mouth ?excessive sweating ?lack or loss of appetite ?nausea,  vomiting ?trouble sleeping ?This list may not describe all possible side effects. Call your doctor for medical advice about side effects. You may report side effects to FDA at 1-800-FDA-1088. ?Where should I keep my medication? ?Keep out of the reach of children and pets. This medicine can be abused. Keep it in a safe place to protect it from theft. Do not share it with anyone. It is only for you. Selling or giving away this medicine is dangerous and against the law. ?Store at room temperature between 20 and 25 degrees C (68 and 77 degrees F). Get rid of any unused medicine after the expiration date. ?To get rid of medicines that are no longer needed or have expired: ?Take the medicine to a medicine take-back program. Check with your pharmacy or law enforcement to find a location. ?If you cannot return the medicine, check the label or package insert to see if the medicine should be thrown out in the garbage or flushed down the toilet. If you are not sure, ask your health care provider. If it is safe to put it in the trash, take the medicine out of the container. Mix the medicine with cat litter, dirt, coffee grounds, or other unwanted substance. Seal the mixture in a bag or container. Put it in the trash. ?NOTE: This sheet is a summary. It may not cover all possible information. If you have questions about this medicine, talk to your doctor, pharmacist, or health care provider. ?? 2022 Elsevier/Gold Standard (2020-05-31 00:00:00) ?Acetaminophen; Oxycodone tablets ?What is this medicine? ?ACETAMINOPHEN; OXYCODONE (a set a MEE noe fen; ox i KOE done) is a pain reliever. It is used to treat moderate to severe pain. ?This medicine may be used for other purposes; ask your health care provider or pharmacist if you have questions. ?COMMON BRAND NAME(S): Endocet, Magnacet, Nalocet, Narvox, Percocet, Perloxx, Primalev, Primlev, Prolate, Roxicet, Xolox ?What should I tell my health care provider before I take this  medicine? ?They need to know if you have any of these conditions: ?brain tumor ?drug abuse or addiction ?head injury ?heart disease ?if you often drink alcohol ? ?kidney disease ? ?liver disease ?low adrenal gland function ?lung disease, asthma, or breathing problem ?seizures ?stomach or intestine problems ?taken an MAOI like Marplan, Nardil, or Parnate in the last 14 days ?an unusual or allergic reaction to acetaminophen, oxycodone, other medicines, foods, dyes, or preservative ?pregnant or trying to get pregnant ?breast-feeding ?How should I use this medicine? ?Take this medicine by mouth with a full glass of water. Take it as directed on the label. You can take it with or without food. If it upsets your stomach, take it with food. Do not use it more often than directed. There may be unused or extra doses in the bottle after you finish your treatment. Talk to your health care provider if you have questions about your dose. ?A special MedGuide will be given to you by the pharmacist with each prescription and refill. Be sure to read this information carefully each time. ?Talk to your health care provider about the use of this medicine in children. Special care may be needed. ?Patients over 49 years of age may have a stronger reaction and need a smaller dose. ?Overdosage: If you think you have taken too much of this medicine  contact a poison control center or emergency room at once. ?NOTE: This medicine is only for you. Do not share this medicine with others. ?What if I miss a dose? ?This does not apply. This medicine is not for regular use. It should only be used as needed. ?What may interact with this medicine? ?This medicine may interact with the following medications: ?alcohol ?antihistamines for allergy, cough and cold ?antiviral medicines for HIV or AIDS ?atropine ?certain antibiotics like clarithromycin, erythromycin, linezolid, rifampin ?certain medicines for anxiety or sleep ?certain medicines for bladder  problems like oxybutynin, tolterodine ?certain medicines for depression like amitriptyline, fluoxetine, sertraline ?certain medicines for fungal infections like ketoconazole, itraconazole, voriconazole ?certain medic

## 2021-09-08 ENCOUNTER — Telehealth: Payer: Self-pay | Admitting: Neurology

## 2021-09-08 LAB — CBC WITH DIFFERENTIAL/PLATELET
Basophils Absolute: 0.1 10*3/uL (ref 0.0–0.2)
Basos: 1 %
EOS (ABSOLUTE): 0.4 10*3/uL (ref 0.0–0.4)
Eos: 8 %
Hematocrit: 37.9 % (ref 34.0–46.6)
Hemoglobin: 13.1 g/dL (ref 11.1–15.9)
Immature Grans (Abs): 0 10*3/uL (ref 0.0–0.1)
Immature Granulocytes: 0 %
Lymphocytes Absolute: 2.7 10*3/uL (ref 0.7–3.1)
Lymphs: 45 %
MCH: 31 pg (ref 26.6–33.0)
MCHC: 34.6 g/dL (ref 31.5–35.7)
MCV: 90 fL (ref 79–97)
Monocytes Absolute: 0.4 10*3/uL (ref 0.1–0.9)
Monocytes: 7 %
Neutrophils Absolute: 2.3 10*3/uL (ref 1.4–7.0)
Neutrophils: 39 %
Platelets: 221 10*3/uL (ref 150–450)
RBC: 4.23 x10E6/uL (ref 3.77–5.28)
RDW: 12 % (ref 11.7–15.4)
WBC: 5.9 10*3/uL (ref 3.4–10.8)

## 2021-09-08 LAB — COMPREHENSIVE METABOLIC PANEL
ALT: 31 IU/L (ref 0–32)
AST: 21 IU/L (ref 0–40)
Albumin/Globulin Ratio: 1.8 (ref 1.2–2.2)
Albumin: 4.3 g/dL (ref 3.8–4.8)
Alkaline Phosphatase: 59 IU/L (ref 44–121)
BUN/Creatinine Ratio: 11 (ref 9–23)
BUN: 8 mg/dL (ref 6–20)
Bilirubin Total: <0.2 mg/dL (ref 0.0–1.2)
CO2: 22 mmol/L (ref 20–29)
Calcium: 9.4 mg/dL (ref 8.7–10.2)
Chloride: 105 mmol/L (ref 96–106)
Creatinine, Ser: 0.72 mg/dL (ref 0.57–1.00)
Globulin, Total: 2.4 g/dL (ref 1.5–4.5)
Glucose: 95 mg/dL (ref 70–99)
Potassium: 4.1 mmol/L (ref 3.5–5.2)
Sodium: 140 mmol/L (ref 134–144)
Total Protein: 6.7 g/dL (ref 6.0–8.5)
eGFR: 115 mL/min/{1.73_m2} (ref >59)

## 2021-09-08 MED ORDER — SUNOSI 75 MG PO TABS
75.0000 mg | ORAL_TABLET | Freq: Every morning | ORAL | 0 refills | Status: DC
Start: 2021-09-08 — End: 2021-09-29

## 2021-09-08 NOTE — Progress Notes (Signed)
WBC count has rebounded. No evidence of viral or bacterial infect. Normal metabolic panel.

## 2021-09-08 NOTE — Telephone Encounter (Signed)
Samples placed at front ?

## 2021-09-08 NOTE — Telephone Encounter (Signed)
Pt is unable to afford cost of the medication. Advised a PA can be done to see if we can get it covered. Pt verbalized understanding. ? ?In the meantime pt will pick up samples to get her started. I will provide the 75 mg dose since that is what Dr Vickey Huger wanted her to start with.  ?

## 2021-09-14 DIAGNOSIS — M62838 Other muscle spasm: Secondary | ICD-10-CM | POA: Diagnosis not present

## 2021-09-14 DIAGNOSIS — M542 Cervicalgia: Secondary | ICD-10-CM | POA: Diagnosis not present

## 2021-09-21 DIAGNOSIS — M62838 Other muscle spasm: Secondary | ICD-10-CM | POA: Diagnosis not present

## 2021-09-21 DIAGNOSIS — M542 Cervicalgia: Secondary | ICD-10-CM | POA: Diagnosis not present

## 2021-09-28 DIAGNOSIS — M542 Cervicalgia: Secondary | ICD-10-CM | POA: Diagnosis not present

## 2021-09-28 DIAGNOSIS — M62838 Other muscle spasm: Secondary | ICD-10-CM | POA: Diagnosis not present

## 2021-09-29 ENCOUNTER — Other Ambulatory Visit: Payer: Self-pay | Admitting: *Deleted

## 2021-09-29 DIAGNOSIS — G47411 Narcolepsy with cataplexy: Secondary | ICD-10-CM

## 2021-09-29 DIAGNOSIS — G4733 Obstructive sleep apnea (adult) (pediatric): Secondary | ICD-10-CM

## 2021-09-29 MED ORDER — SUNOSI 150 MG PO TABS: 1.0000 | ORAL_TABLET | Freq: Every day | ORAL | 5 refills | Status: DC

## 2021-09-29 MED ORDER — SUNOSI 150 MG PO TABS: 1.0000 | ORAL_TABLET | Freq: Every day | ORAL | 0 refills | Status: DC

## 2021-09-29 NOTE — Telephone Encounter (Signed)
Called [facility] to get pt insurance info. Spoke w/ Tere[employer]. ? ?[employer] ?ID: [id number] ?RxBin: [id number] ?RxPCN: her plan does not have PCN ?RxGrp: [id number] ? ?Phone# [phone] ?

## 2021-09-29 NOTE — Telephone Encounter (Signed)
PA submitted on CMM. Received instant approval: "CaseId:[id number];Status:Approved;Review Type:Prior Auth;Coverage Start Date:08/30/2021;Coverage End Date:09/29/2022" ?

## 2021-09-29 NOTE — Progress Notes (Signed)
I filled Sunosi with 5 refills.  ?

## 2021-09-29 NOTE — Telephone Encounter (Signed)
Initiated PA Sunosi 150mg  on CMM. Key: [id number]. In process of completing. ?

## 2021-09-29 NOTE — Addendum Note (Signed)
Addended by: Wyvonnia Lora on: 09/29/2021 11:59 AM ? ? Modules accepted: Orders ? ?

## 2021-10-03 ENCOUNTER — Ambulatory Visit (INDEPENDENT_AMBULATORY_CARE_PROVIDER_SITE_OTHER): Payer: BC Managed Care – PPO | Admitting: Family Medicine

## 2021-10-03 ENCOUNTER — Encounter: Payer: Self-pay | Admitting: Family Medicine

## 2021-10-03 VITALS — BP 123/82 | HR 97 | Ht 66.0 in | Wt 169.6 lb

## 2021-10-03 DIAGNOSIS — R112 Nausea with vomiting, unspecified: Secondary | ICD-10-CM | POA: Diagnosis not present

## 2021-10-03 DIAGNOSIS — R197 Diarrhea, unspecified: Secondary | ICD-10-CM

## 2021-10-03 LAB — CBC
HCT: 41.6 % (ref 36.0–46.0)
Hemoglobin: 14.3 g/dL (ref 12.0–15.0)
MCHC: 34.4 g/dL (ref 30.0–36.0)
MCV: 88.1 fl (ref 78.0–100.0)
Platelets: 212 10*3/uL (ref 150.0–400.0)
RBC: 4.72 Mil/uL (ref 3.87–5.11)
RDW: 12.6 % (ref 11.5–15.5)
WBC: 5.3 10*3/uL (ref 4.0–10.5)

## 2021-10-03 LAB — BASIC METABOLIC PANEL
BUN: 8 mg/dL (ref 6–23)
CO2: 23 mEq/L (ref 19–32)
Calcium: 8.9 mg/dL (ref 8.4–10.5)
Chloride: 105 mEq/L (ref 96–112)
Creatinine, Ser: 0.79 mg/dL (ref 0.40–1.20)
GFR: 99.59 mL/min (ref >60.00)
Glucose, Bld: 107 mg/dL — ABNORMAL HIGH (ref 70–99)
Potassium: 3.8 mEq/L (ref 3.5–5.1)
Sodium: 136 mEq/L (ref 135–145)

## 2021-10-03 MED ORDER — ONDANSETRON HCL 8 MG PO TABS: 8.0000 mg | ORAL_TABLET | Freq: Three times a day (TID) | ORAL | 0 refills | Status: DC | PRN

## 2021-10-03 NOTE — Patient Instructions (Signed)
Labs to ensure stable ?Can try Zofran, Bentyl, BRAT diet, adequate hydration ?If not improving recommend f/u with GI ?

## 2021-10-03 NOTE — Progress Notes (Signed)
? ?Acute Office Visit ? ?Subjective:  ? ? Patient ID: Brandi Hall, female    DOB: 02-09-1991, 31 y.o.   MRN: [id number] ? ?CC: diarrhea, nausea ? ? ?Diarrhea  ?This is a new problem. The current episode started in the past 7 days (4-5 days ago). The problem occurs more than 10 times per day. The problem has been unchanged. The patient states that diarrhea awakens her from sleep. Associated symptoms include abdominal pain, bloating, headaches and increased flatus. Pertinent negatives include no arthralgias, chills, coughing, fever, myalgias, sweats, URI, vomiting or weight loss. Risk factors include ill contacts. She has tried increased fluids for the symptoms. The treatment provided no relief. Her past medical history is significant for irritable bowel syndrome.  ? ?States there will be times when she can audibly hear her stomach rumbling very loud for hours, regardless of when she has last tried to eat. Her youngest daughter had a GI bug with diarrhea only last weekend, that lasted through Sunday. Patient then started feeling bad on Wednesday night - started with nausea, vomiting, generalized abdominal discomfort, then progressed to diarrhea. States the vomiting stopped after the first day or too, but diarrhea has persisted as stated above. She thinks she has lost about 7 pounds in the past 5 days. She reports her diarrhea is loose/watery, yellow.  ? ? ? ?Past Medical History:  ?Diagnosis Date  ? Allergic rhinitis   ? Anxiety   ? Asthma   ? Bronchitis   ? Lymph node abscess   ? hosp for infected lymph node (Salmonella) at 31 y/o  ? PCOS (polycystic ovarian syndrome)   ? Pneumonia   ? Post partum depression   ? Post term pregnancy, 41 weeks 12/18/2014  ? Postpartum care following vaginal delivery (7/1) 12/18/2014  ? Sleep apnea   ? CPAP @ [facility]  ? ? ?Past Surgical History:  ?Procedure Laterality Date  ? LAD bx    ? MASS EXCISION Right 02/22/2021  ? Procedure: EXCISION RIGHT NASAL POLYPOID MASS;  Surgeon: Rozetta Nunnery, MD;  Location: [facility];  Service: ENT;  Laterality: Right;  ? NASAL SEPTOPLASTY W/ TURBINOPLASTY N/A 02/22/2021  ? Procedure: NASAL SEPTOPLASTY WITH TURBINATE REDUCTION;  Surgeon: Rozetta Nunnery, MD;  Location: [facility];  Service: ENT;  Laterality: N/A;  ? WISDOM TOOTH EXTRACTION    ? ? ?Family History  ?Problem Relation Age of Onset  ? Breast cancer Mother   ?     dx age late 65s  ? Asthma Father   ? Uterine cancer Maternal Aunt   ? Colon cancer Other   ?     Colon  ? Diabetes Other   ?     GGF  ? Coronary artery disease Other   ? Congestive Heart Failure Other   ? ? ?Social History  ? ?Socioeconomic History  ? Marital status: Married  ?  Spouse name: marc  ? Number of children: 2  ? Years of education: Not on file  ? Highest education level: Bachelor's degree (e.g., BA, AB, BS)  ?Occupational History  ? Occupation: finished college, works-- Medical laboratory scientific officer  ?Tobacco Use  ? Smoking status: Never  ? Smokeless tobacco: Never  ?Vaping Use  ? Vaping Use: Never used  ?Substance and Sexual Activity  ? Alcohol use: Yes  ?  Alcohol/week: 0.0 standard drinks  ?  Comment: rarely  ? Drug use: No  ? Sexual activity: Yes  ?Other Topics Concern  ? Not on file  ?  Social History Narrative  ? Live with husband 2 daughter - 2016 , 2020  ? Right handed  ? Caffeine: 3 cups of coffee a day and 1 tea or soda a day  ? ?Social Determinants of Health  ? ?Financial Resource Strain: Not on file  ?Food Insecurity: Not on file  ?Transportation Needs: Not on file  ?Physical Activity: Not on file  ?Stress: Not on file  ?Social Connections: Not on file  ?Intimate Partner Violence: Not on file  ? ? ?Outpatient Medications Prior to Visit  ?Medication Sig Dispense Refill  ? acetaminophen (TYLENOL) 500 MG tablet Take 1,000 mg by mouth every 6 (six) hours as needed for mild pain.    ? albuterol (VENTOLIN HFA) 108 (90 Base) MCG/ACT inhaler INHALE TWO PUFFS BY MOUTH EVERY 6 HOURS AS NEEDED FOR WHEEZING OR FOR SHORTNESS OF BREATH 18 g 5  ?  ALPRAZolam (XANAX) 0.25 MG tablet Take 1 tablet (0.25 mg total) by mouth at bedtime as needed for anxiety. 30 tablet 0  ? dicyclomine (BENTYL) 10 MG capsule Take 1 capsule (10 mg total) by mouth 3 (three) times daily as needed for spasms. 30 capsule 1  ? Lactobacillus (PROBIOTIC ACIDOPHILUS PO) Take 1 capsule by mouth daily.    ? MAGNESIUM CITRATE PO Take 1 tablet by mouth daily.    ? Nutritional Supplements (JUICE PLUS FIBRE PO) Take 3 capsules by mouth daily. Veggie    ? omeprazole (PRILOSEC) 40 MG capsule Take 1 capsule (40 mg total) by mouth 2 (two) times daily before a meal. 120 capsule 0  ? OVER THE COUNTER MEDICATION Take 4 capsules by mouth daily. Myo-inositol    ? Solriamfetol HCl (SUNOSI) 150 MG TABS Take 1 tablet by mouth daily. 7 tablet 0  ? Solriamfetol HCl (SUNOSI) 150 MG TABS Take 1 tablet by mouth daily after breakfast. 30 tablet 5  ? Tetrahydrozoline-Zn Sulfate (EYE DROPS ALLERGY RELIEF OP) Place 1 drop into both eyes daily as needed (allergy).    ? triamcinolone (NASACORT) 55 MCG/ACT AERO nasal inhaler Place 2 sprays into the nose daily. 2 sprays each nostril at night. (Patient taking differently: Place 2 sprays into the nose daily as needed (allergies).) 1 each 12  ? ?No facility-administered medications prior to visit.  ? ? ?Allergies  ?Allergen Reactions  ? Gluten Meal Diarrhea  ? Soy Allergy Nausea And Vomiting  ? Wound Dressing Adhesive Rash  ?  Glue on Bandaids  ? ? ?Review of Systems ?All review of systems negative except what is listed in the HPI ? ? ?   ?Objective:  ?  ?Physical Exam ?Vitals reviewed.  ?Constitutional:   ?   Appearance: Normal appearance.  ?HENT:  ?   Head: Normocephalic and atraumatic.  ?Cardiovascular:  ?   Rate and Rhythm: Normal rate and regular rhythm.  ?Pulmonary:  ?   Effort: Pulmonary effort is normal.  ?   Breath sounds: Normal breath sounds.  ?Abdominal:  ?   General: Abdomen is flat. Bowel sounds are normal. There is no distension.  ?   Palpations: Abdomen is  soft. There is no mass.  ?   Tenderness: There is no abdominal tenderness. There is no guarding or rebound.  ?Musculoskeletal:  ?   Cervical back: Normal range of motion and neck supple.  ?Skin: ?   General: Skin is warm and dry.  ?Neurological:  ?   General: No focal deficit present.  ?   Mental Status: She is alert and oriented to  person, place, and time. Mental status is at baseline.  ?Psychiatric:     ?   Mood and Affect: Mood normal.     ?   Behavior: Behavior normal.     ?   Thought Content: Thought content normal.     ?   Judgment: Judgment normal.  ? ? ?BP 123/82   Pulse 97   Ht _0  (1.676 m)   Wt 169 lb 9.6 oz (76.9 kg)   BMI 27.37 kg/m?  ?Wt Readings from Last 3 Encounters:  ?10/03/21 169 lb 9.6 oz (76.9 kg)  ?09/07/21 173 lb 8 oz (78.7 kg)  ?09/06/21 170 lb (77.1 kg)  ? ? ?Health Maintenance Due  ?Topic Date Due  ? COVID-19 Vaccine (4 - Booster for Pfizer series) 07/21/2020  ? ? ?There are no preventive care reminders to display for this patient. ? ? ?Lab Results  ?Component Value Date  ? TSH 1.934 12/01/2020  ? ?Lab Results  ?Component Value Date  ? WBC 5.9 09/07/2021  ? HGB 13.1 09/07/2021  ? HCT 37.9 09/07/2021  ? MCV 90 09/07/2021  ? PLT 221 09/07/2021  ? ?Lab Results  ?Component Value Date  ? NA 140 09/07/2021  ? K 4.1 09/07/2021  ? CO2 22 09/07/2021  ? GLUCOSE 95 09/07/2021  ? BUN 8 09/07/2021  ? CREATININE 0.72 09/07/2021  ? BILITOT <0.2 09/07/2021  ? ALKPHOS 59 09/07/2021  ? AST 21 09/07/2021  ? ALT 31 09/07/2021  ? PROT 6.7 09/07/2021  ? ALBUMIN 4.3 09/07/2021  ? CALCIUM 9.4 09/07/2021  ? ANIONGAP 9 09/06/2021  ? EGFR 115 09/07/2021  ? GFR 93.91 07/25/2017  ? ?Lab Results  ?Component Value Date  ? CHOL 178 02/15/2021  ? ?Lab Results  ?Component Value Date  ? HDL 56.10 02/15/2021  ? ?Lab Results  ?Component Value Date  ? Melvin 89 02/15/2021  ? ?Lab Results  ?Component Value Date  ? TRIG 164.0 (H) 02/15/2021  ? ?Lab Results  ?Component Value Date  ? CHOLHDL 3 02/15/2021  ? ?Lab Results   ?Component Value Date  ? HGBA1C 5.1 02/15/2021  ? ? ?   ?Assessment & Plan:  ? ?1. Nausea and vomiting, unspecified vomiting type ?2. Diarrhea, unspecified type ?Possible viral gastroenteritis given daughter with G

## 2021-10-04 ENCOUNTER — Encounter: Payer: Self-pay | Admitting: Family Medicine

## 2021-10-05 LAB — SALMONELLA/SHIGELLA CULT, CAMPY EIA AND SHIGA TOXIN RFL ECOLI
MICRO NUMBER: 13272157
MICRO NUMBER:: 13272158
MICRO NUMBER:: 13272159
Result:: NOT DETECTED
SHIGA RESULT:: NOT DETECTED
SPECIMEN QUALITY: ADEQUATE
SPECIMEN QUALITY:: ADEQUATE
SPECIMEN QUALITY:: ADEQUATE

## 2021-10-05 LAB — CLOSTRIDIUM DIFFICILE BY PCR: Toxigenic C. Difficile by PCR: NEGATIVE

## 2021-10-06 ENCOUNTER — Telehealth: Payer: Self-pay | Admitting: Neurology

## 2021-10-06 MED ORDER — SUNOSI 75 MG PO TABS: 150.0000 mg | ORAL_TABLET | ORAL | 0 refills | Status: DC

## 2021-10-06 NOTE — Telephone Encounter (Signed)
Pt reached out stating that her pharmacy is out of the sunosi and should get in stock in next day or so. She is asking for samples to hold her over. Pt is on 150 mg each am and we are out of that strength. I will provide 75 mg tablet samples.  ?

## 2021-10-12 DIAGNOSIS — M62838 Other muscle spasm: Secondary | ICD-10-CM | POA: Diagnosis not present

## 2021-10-12 DIAGNOSIS — M542 Cervicalgia: Secondary | ICD-10-CM | POA: Diagnosis not present

## 2021-10-19 DIAGNOSIS — M62838 Other muscle spasm: Secondary | ICD-10-CM | POA: Diagnosis not present

## 2021-10-19 DIAGNOSIS — M542 Cervicalgia: Secondary | ICD-10-CM | POA: Diagnosis not present

## 2021-11-07 ENCOUNTER — Other Ambulatory Visit: Payer: Self-pay | Admitting: Family Medicine

## 2021-11-07 MED ORDER — ALPRAZOLAM 0.25 MG PO TABS: 0.2500 mg | ORAL_TABLET | Freq: Every evening | ORAL | 0 refills | Status: DC | PRN

## 2021-11-07 NOTE — Telephone Encounter (Signed)
Last OV was on 09/07/21.  Next OV is scheduled for 12/06/21 .  Last RX was written on 07/05/21 for 30 tabs.   [place] [facility] has been reviewed.

## 2021-11-09 DIAGNOSIS — M62838 Other muscle spasm: Secondary | ICD-10-CM | POA: Diagnosis not present

## 2021-11-09 DIAGNOSIS — M542 Cervicalgia: Secondary | ICD-10-CM | POA: Diagnosis not present

## 2021-11-16 DIAGNOSIS — M542 Cervicalgia: Secondary | ICD-10-CM | POA: Diagnosis not present

## 2021-11-16 DIAGNOSIS — M62838 Other muscle spasm: Secondary | ICD-10-CM | POA: Diagnosis not present

## 2021-11-24 ENCOUNTER — Telehealth: Payer: Self-pay | Admitting: Family Medicine

## 2021-11-24 NOTE — Telephone Encounter (Signed)
Can you guys check with her to see how she is doing? I last saw her 06/2021 and she continued to have EDS on CPAP and Sunosi. She wished to discuss MSLT with Dr Vickey Huger. She was advised to schedule next visit with her but is on my schedule 6/20. I am happy to see her if doing well. If she still wishes to discuss MSLT testing, we should get her scheduled with Dr Vickey Huger instead. TY!

## 2021-11-24 NOTE — Telephone Encounter (Signed)
Took call from phone staff and spoke with pt. She would prefer to r/s appt to Dr. Vickey Huger. Has questions she would like addressed. Interested in MSLT still. Rescheduled her to 11/29/21 at 11am with Dr. Vickey Huger. Cx appt on 06/20 w/ AL,NP.

## 2021-11-24 NOTE — Telephone Encounter (Signed)
LVM for pt to call office
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
## 2021-11-29 ENCOUNTER — Encounter: Payer: Self-pay | Admitting: Neurology

## 2021-11-29 ENCOUNTER — Ambulatory Visit (INDEPENDENT_AMBULATORY_CARE_PROVIDER_SITE_OTHER): Payer: BC Managed Care – PPO | Admitting: Neurology

## 2021-11-29 VITALS — BP 104/72 | HR 82 | Ht 66.0 in | Wt 168.0 lb

## 2021-11-29 DIAGNOSIS — R9089 Other abnormal findings on diagnostic imaging of central nervous system: Secondary | ICD-10-CM

## 2021-11-29 DIAGNOSIS — G47411 Narcolepsy with cataplexy: Secondary | ICD-10-CM | POA: Diagnosis not present

## 2021-11-29 DIAGNOSIS — M542 Cervicalgia: Secondary | ICD-10-CM | POA: Diagnosis not present

## 2021-11-29 DIAGNOSIS — G4710 Hypersomnia, unspecified: Secondary | ICD-10-CM

## 2021-11-29 DIAGNOSIS — R6889 Other general symptoms and signs: Secondary | ICD-10-CM | POA: Diagnosis not present

## 2021-11-29 DIAGNOSIS — M62838 Other muscle spasm: Secondary | ICD-10-CM | POA: Diagnosis not present

## 2021-11-29 DIAGNOSIS — H814 Vertigo of central origin: Secondary | ICD-10-CM | POA: Diagnosis not present

## 2021-11-29 NOTE — Progress Notes (Signed)
[facility]    Provider:  Larey Seat, MD  Primary Care Physician:  Colon Branch, [address] [place] [address]     Referring Provider: Colon Branch, [address] [place],  [place] [address]          Chief Complaint according to patient   Patient presents with:     New Patient (Initial Visit)     Internal referral for abnormal brain MRI. Pt went to the ER for dizziness. ER f/u. Seen at the ER for heart palpitation.   Last visit : Here for possible medication reaction to armodafinil.       HISTORY OF PRESENT ILLNESS: Brandi Hall is a 31 y.o. Caucasian female patient and Rv 11/29/2021: She is improved in terms of vertigo.  She has been tested for OSA, positive and she uses CPAP at 77% compliance. Reduced compliance due to repeated stomach bugs and nausea, diarrhea and vomiting for 6 days of the month.  She added a medical history of meningitis as a baby, at 19-46 months of age.  She reports random sharp pains and limbs feeling as if falling asleep. Her brother was  presenting with similar problems, and worked up for [place], negative. He has hypersomnia, and he was never tested. Mutual Grandfather had OSA and narcolepsy.  She asks if this can be manifestation of cataplexy. In AM , she feels heavy , her arms will be asleep, likely sleep paralysis.  She has used Sunosi at 150  mg and felt this is a good replacement for modafinil. New script needed for September 2023. 5 refills.  Her own MRI was  suggestive of microvascular disease at age 57 . Dizziness   EXAM: MRI HEAD WITHOUT CONTRAST   TECHNIQUE: Multiplanar, multiecho pulse sequences of the brain and surrounding structures were obtained without intravenous contrast.   COMPARISON:  None.   FINDINGS: Brain: No acute infarct, mass effect or extra-axial collection. No acute or chronic hemorrhage. There is multifocal hyperintense T2-weighted signal within the white matter.  Parenchymal volume and CSF spaces are normal. The midline structures are normal.   Vascular: Major flow voids are preserved.   Skull and upper cervical spine: Normal calvarium and skull base. Visualized upper cervical spine and soft tissues are normal.   Sinuses/Orbits:No paranasal sinus fluid levels or advanced mucosal thickening. Right mastoid effusion. Normal orbits.   IMPRESSION: 1. No acute intracranial abnormality. 2. Multifocal hyperintense T2-weighted signal within the white matter, most commonly seen in the setting of chronic microvascular ischemia.      By: Ulyses Jarred M.D.  Should we consider MS work up ?      RV : Patient with dizziness spells, OSA and suspected recent reaction to Modafinil. She is the mother of a 8 and a 27 year old daughter-  Every day the patient has taken modafinil since January of this year and has taken it every day in the morning she was last seen by Debbora Presto most practitioner on 05 July 2021 for the primary diagnosis of persistent hypersomnia while having obstructive sleep apnea treated on CPAP.   Narcolepsy with cataplexy was the working diagnosis until then.  She was treated for the first time with modafinil and had taken it every day until on the morning of 21 March he has not she noted that she became flushed very red her skin color changed she felt as if blood  was pooling in her extremities she developed shakes, she developed shortness of breath and palpitations her smart watch showed her that her heart rate was 130, she had not had previous issues with armodafinil.  She was reporting that she had some word finding trouble she struggled to find words but she was also stuttering or stammering.  She felt however fully oriented.Marland Kitchen  She was highly anxious of course during the spell.  She presented to the ED at 10:40 AM all that at least when the triage nurse and noted her.   She had an EKG 12-lead which actually shows normal sinus rhythm.     Heart rate was 86 in the emergency room EKG was not abnormal basic metabolic panel was obtained CBC was obtained for safety reasons a pregnancy test was obtained and troponin she was discharged at 1400 hrs.  She has not taken armodafinil today however I am concerned that this is not a typical expected side effect only known side effect I have seen patients with sinus tachycardia, I do not ever remember somebody having word finding difficulties or any kind of disorientation or confusion on Armodafinil on a  low-dose 50 mg.   She took the medication at 9 am , had coffee after , but only a few sips.   Labs in ED show low WBC. My nurse today undertook the effort to do an orthostatic blood pressure measurement in a lying position the patient had 128/78 mmHg blood pressure with a heart rate of 91 she felt actually slightly dizzy just lying down.  In a seated position the blood pressure was only attached at 230/84 and her heart rate was reduced initially 90 bpm regular but she felt slightly dizzy.  Standing blood pressure increased further 236/84 heart rate increased significantly 216 and she again had a slight dizziness after 3 minutes of standing blood pressure reduced 222/86 and heart rate went back to 99 she still felt dizzy but these values would be considered in normal range.       2022: Consult by dr Larose Kells. Internal referral for abnormal brain MRI. Pt went to the ER for dizziness. Vertigo- feeling as if she would tumble over, forward, not a spinning -rotating sensation.  ED On 12/01/20. Dizziness from possible positional vertigo. BP has been normal. Pt taking meclizine. Pt had nasal nasalpasty on 96 and has not been able to use CPAP recenltey. I had tested the patient for narcolepsy, HLA was negative, but sleep study had demonstrated OSA and she has been using auto pap for 6-16 cm water,  AHI is 0.2/h ! 95%6.1 cm water, no air leak.  She has been using CPAP with a 85% compliance.  Brandi Hall had undergone  a nasal septal plasty on 6 September and she is now rather congested.  I advised her not to use the CPAP for another 10 to 14 days until the swelling has come down.  She can use Afrin but I rather have her use a nondecongestant nasal spray so that there is no rebound effect.  The rebound effect is based on the vasoconstricting mechanisms and decongestants but also can lead to high blood pressure peaks.  They may also cause some dizziness or even pulsatile tinnitus tinnitus.  So she was seen in the emergency room for dizziness just in June and her CT of the head was a normal noncontrast CT it was followed by an MRI of the which was read as showing no acute infarct but multifocal hyperintense T2  weighted signal within the white matter.  No atrophy was noted this would not be typical for her age to present with chronic microvascular ischemia.  He can see that with advanced diabetes or a longstanding history of vascular headaches and migraines.  So her primary care physician asked her also to see me because of these spells.        Chief concern according to patient : hypersomnia:    I have the pleasure of seeing Brandi Hall toda 06-30-2021, a right -handed female with a reported history of hypersomnia, onset over 10 years ago. She started drinking coffee at age 85 to combat  Asthma, and sleepiness.  Brandi Hall also reports that she was excessively sleepy when she couldn't drink caffeine and she named her pregnancies as an example.  She endorsed the Epworth sleepiness score at a very high rate of 19 out of 24 possible points, and stated that she is always taking naps and feels that she needs to sleep more.  She always feels tired. She had Covid 19 in February in 2021- and she was sick, headaches, and fatigue.  Her whole family got it. She has been told that she snores she has suffered from anxiety if there is also some joint pain that may contribute to some discomfort overnight and the level of decreased  energy in daytime. She takes 1-2 hours naps, not power naps.  She dreams even in naps, has had sleep paralysis, dreams are very vivid , terrifying her. She sleep talks. She is restless, moves frequently in sleep.  She  has a past medical history of Allergic rhinitis, Anxiety,  Allergic Asthma, Lymph node abscess, Post term pregnancy, 41 weeks (12/18/2014), and Postpartum care following vaginal delivery (7/1) (12/18/2014).     Sleep relevant medical history: allergic rhinitis and asthma, hypersomnia, excessive daytime sleepiness and vivid dreams. Cataplectic attacks, knees buckle. Dream intrusions.     Family medical /sleep history: MGF on CPAP with OSA, mother and sister have hypersomnia.     Social history: Patient is working as a Medical laboratory scientific officer- and lives in a household with husband and 2 daughters, and one cat.  . Tobacco use; none .  ETOH use ; none ,  Caffeine intake in form of Coffee(2 cups in AM and cold brew  2 cups in PM) Soda( /). Regular exercise in form of dancing       Sleep habits are as follows: The patient's dinner time is between 7-8 PM. The patient goes to bed at 9 PM and continues to sleep for 8-9 hours, she does not wakes for  bathroom breaks. The preferred sleep position is in fetal position, with the support of 1-2 pillows. Dreams are reportedly frequent/vivid  8 AM is the usual rise time.The patient wakes up with great difficulty, she sleeps through multiple alarms.  She reports not feeling refreshed or restored in AM, with symptoms such as dry mouth morning headaches, and residual fatigue. Naps are taken frequently, lasting 1-2 hours.     Review of Systems: Out of a complete 14 system review, the patient complains of only the following symptoms, and all other reviewed systems are negative.:  Fatigue, sleepiness , snoring, vivid dreams, sleep paralysis, cataplectic attacks.  How likely are you to doze in the following situations: 0 = not likely, 1 = slight chance, 2 =  moderate chance, 3 = high chance   Sitting and Reading? Watching Television? Sitting inactive in a public place (theater or meeting)? As a  passenger in a car for an hour without a break? Lying down in the afternoon when circumstances permit? Sitting and talking to someone? Sitting quietly after lunch without alcohol? In a car, while stopped for a few minutes in traffic?   Total = 11/ 24 points on sunosi   FSS endorsed at 63/ 63 points.        Social History   Socioeconomic History   Marital status: Married    Spouse name: marc   Number of children: 2   Years of education: Not on file   Highest education level: Bachelor's degree (e.g., BA, AB, BS)  Occupational History   Occupation: finished college, works-- Medical laboratory scientific officer  Tobacco Use   Smoking status: Never   Smokeless tobacco: Never  Vaping Use   Vaping Use: Never used  Substance and Sexual Activity   Alcohol use: Yes    Alcohol/week: 0.0 standard drinks of alcohol    Comment: rarely   Drug use: No   Sexual activity: Yes  Other Topics Concern   Not on file  Social History Narrative   Live with husband 2 daughter - 2016 , 2020   Right handed   Caffeine: 3 cups of coffee a day and 1 tea or soda a day   Social Determinants of Health   Financial Resource Strain: Not on file  Food Insecurity: Not on file  Transportation Needs: Not on file  Physical Activity: Not on file  Stress: Not on file  Social Connections: Not on file    Family History  Problem Relation Age of Onset   Breast cancer Mother        dx age late 16s   Asthma Father    Uterine cancer Maternal Aunt    Colon cancer Other        Colon   Diabetes Other        GGF   Coronary artery disease Other    Congestive Heart Failure Other     Past Medical History:  Diagnosis Date   Allergic rhinitis    Anxiety    Asthma    Bronchitis    Lymph node abscess    hosp for infected lymph node (Salmonella) at 31 y/o   PCOS (polycystic ovarian  syndrome)    Pneumonia    Post partum depression    Post term pregnancy, 41 weeks 12/18/2014   Postpartum care following vaginal delivery (7/1) 12/18/2014   Sleep apnea    CPAP @ [facility]    Past Surgical History:  Procedure Laterality Date   LAD bx     MASS EXCISION Right 02/22/2021   Procedure: EXCISION RIGHT NASAL POLYPOID MASS;  Surgeon: Rozetta Nunnery, MD;  Location: [facility];  Service: ENT;  Laterality: Right;   NASAL SEPTOPLASTY W/ TURBINOPLASTY N/A 02/22/2021   Procedure: NASAL SEPTOPLASTY WITH TURBINATE REDUCTION;  Surgeon: Rozetta Nunnery, MD;  Location: [facility];  Service: ENT;  Laterality: N/A;   WISDOM TOOTH EXTRACTION       Current Outpatient Medications on File Prior to Visit  Medication Sig Dispense Refill   acetaminophen (TYLENOL) 500 MG tablet Take 1,000 mg by mouth every 6 (six) hours as needed for mild pain.     albuterol (VENTOLIN HFA) 108 (90 Base) MCG/ACT inhaler INHALE TWO PUFFS BY MOUTH EVERY 6 HOURS AS NEEDED FOR WHEEZING OR FOR SHORTNESS OF BREATH 18 g 5   ALPRAZolam (XANAX) 0.25 MG tablet Take 1 tablet (0.25 mg total) by mouth at bedtime as needed  for anxiety. 30 tablet 0   dicyclomine (BENTYL) 10 MG capsule Take 1 capsule (10 mg total) by mouth 3 (three) times daily as needed for spasms. 30 capsule 1   OVER THE COUNTER MEDICATION Take 4 capsules by mouth daily. Myo-inositol     Solriamfetol HCl (SUNOSI) 150 MG TABS Take 1 tablet by mouth daily after breakfast. 30 tablet 5   Tetrahydrozoline-Zn Sulfate (EYE DROPS ALLERGY RELIEF OP) Place 1 drop into both eyes daily as needed (allergy).     triamcinolone (NASACORT) 55 MCG/ACT AERO nasal inhaler Place 2 sprays into the nose daily. 2 sprays each nostril at night. (Patient taking differently: Place 2 sprays into the nose daily as needed (allergies).) 1 each 12   omeprazole (PRILOSEC) 40 MG capsule Take 1 capsule (40 mg total) by mouth 2 (two) times daily before a meal. 120 capsule 0   No current  facility-administered medications on file prior to visit.    Allergies  Allergen Reactions   Gluten Meal Diarrhea   Soy Allergy Nausea And Vomiting   Wound Dressing Adhesive Rash    Glue on Bandaids    Physical exam:  Today's Vitals   11/29/21 1044  BP: 104/72  Pulse: 82  Weight: 168 lb (76.2 kg)  Height: _0  (1.676 m)   Body mass index is 27.12 kg/m.   Wt Readings from Last 3 Encounters:  11/29/21 168 lb (76.2 kg)  10/03/21 169 lb 9.6 oz (76.9 kg)  09/07/21 173 lb 8 oz (78.7 kg)     Ht Readings from Last 3 Encounters:  11/29/21 _1  (1.676 m)  10/03/21 _2  (1.676 m)  09/07/21 _3  (1.676 m)      General: The patient is awake, alert and appears not in acute distress. The patient is well groomed. Head: Normocephalic, atraumatic. Neck is supple.  Mallampati 2,  neck circumference: 14 inches . Nasal airflow patent only on the left nasion- septal deviation.    Retrognathia is seen. Crowded teeth, wore an expanding brace Dental status: native.  Cardiovascular:  Regular rate and cardiac rhythm by pulse,  without distended neck veins. Respiratory: Lungs are clear to auscultation.  Skin:  Without evidence of ankle edema, or rash. Trunk: The patient's posture is erect.   Neurologic exam : The patient is awake and alert, oriented to place and time.   Memory subjective described as intact.  Attention span & concentration ability appears normal.  Speech is fluent,  without  dysarthria, dysphonia or aphasia.  Mood and affect are appropriate.   Cranial nerves: no loss of smell or taste reported  Pupils are equal and briskly reactive to light. Funduscopic exam  deferred.  Extraocular movements in vertical and horizontal planes were intact and without nystagmus. No Diplopia. Visual fields by finger perimetry are intact. Hearing was intact to soft voice and finger rubbing.    Facial sensation intact to fine touch.  Facial motor strength is symmetric and tongue and  uvula move midline.  Neck ROM : rotation, tilt and flexion extension were normal for age and shoulder shrug was symmetrical.    Motor exam:  Symmetric bulk, tone and ROM.   Normal tone without cog wheeling, symmetric grip strength .   Sensory:  Fine touch, pinprick and vibration were tested  and  normal.  Proprioception tested in the upper extremities was normal.   Coordination: Rapid alternating movements in the fingers/hands were of normal speed.  The Finger-to-nose maneuver was intact without evidence of ataxia, dysmetria  or tremor.   Gait and station: Patient could rise unassisted from a seated position, walked without assistive device.  Stance is of normal width/ base and the patient turned with 3 steps.  Toe and heel walk were deferred.  Deep tendon reflexes: in the  upper and lower extremities are symmetric and intact.  Babinski response was deferred.        After spending a total time of  45  minutes face to face and additional time for physical and neurologic examination, review of laboratory studies,  personal review of imaging studies, reports and results of other testing and review of referral information / records as far as provided in visit, I have established the following assessments:  CLINICAL DATA:  Dizziness   EXAM: MRI HEAD WITHOUT CONTRAST   TECHNIQUE: Multiplanar, multiecho pulse sequences of the brain and surrounding structures were obtained without intravenous contrast.   COMPARISON:  None.   FINDINGS: Brain: No acute infarct, mass effect or extra-axial collection. No acute or chronic hemorrhage. There is multifocal hyperintense T2-weighted signal within the white matter. Parenchymal volume and CSF spaces are normal. The midline structures are normal.   Vascular: Major flow voids are preserved.   Skull and upper cervical spine: Normal calvarium and skull base. Visualized upper cervical spine and soft tissues are normal.   Sinuses/Orbits:No  paranasal sinus fluid levels or advanced mucosal thickening. Right mastoid effusion. Normal orbits.   IMPRESSION: 1. No acute intracranial abnormality. 2. Multifocal hyperintense T2-weighted signal within the white matter, most commonly seen in the setting of chronic microvascular ischemia.    Assessment : Patient with dizziness spells, OSA and  recent reaction to Modafinil. Reports now sensory abnormalities , the same as one of her brothers. Tingling, numbness and transient weakness.   1)  Sunosi  She is diagnosed with narcolepsy and she has vivid dreams on Armodafinil.  She can't use XYREM/ XYWav as a mother of  40 year old. She is now doing well on SUNOSI.   2) I will  also repeat her CMET. For MRI brain and cervical spine with contrast.   3) no change in CPAP settings, residual AHI is 0.1/h.      I would like to thank Colon Branch, MD and Colon Branch, [address] [place],  [place] [address] for allowing me to meet with and to take care of this pleasant patient.   I plan to follow up either personally or through our NP within 3-4 months after new MRI brain and cervical spine with contrast.   Patient would like to learn more about alzheimer's dementia testing, blood- for her mother.   CC: I will share my notes with PCP.   Electronically signed by: Larey Seat, MD 11/29/2021 11:44 AM  [facility] Board certified by The [employer] of Sleep Medicine and Diplomate of the [employer] of Sleep Medicine. Board certified In Neurology through the [employer], Fellow of the [employer] of Neurology. Medical Director of [facility].

## 2021-11-29 NOTE — Patient Instructions (Signed)
Solriamfetol Tablets What is this medication? SOLRIAMFETOL (sol ri AM fe tol) treats sleep disorders, such as narcolepsy and obstructive sleep apnea. It works by promoting wakefulness. This medicine may be used for other purposes; ask your health care provider or pharmacist if you have questions. COMMON BRAND NAME(S): SUNOSI What should I tell my care team before I take this medication? They need to know if you have any of these conditions: Bipolar disorder Diabetes Heart disease High blood pressure High cholesterol History of stroke Kidney disease Schizophrenia Substance use disorder An unusual or allergic reaction to solriamfetol, other medications, foods, dyes, or preservatives Pregnant or trying to get pregnant Breast-feeding How should I use this medication? Take this medication by mouth with water. Take it as directed on the prescription label at the same time every day. You can take it with or without food. If it upsets your stomach, take it with food. Keep taking it unless your care team tells you to stop. A special MedGuide will be given to you by the pharmacist with each prescription and refill. Be sure to read this information carefully each time. Talk to your care team about the use of this medication in children. Special care may be needed. Overdosage: If you think you have taken too much of this medicine contact a poison control center or emergency room at once. NOTE: This medicine is only for you. Do not share this medicine with others. What if I miss a dose? If you miss a dose, take it as soon as you can. However, avoid taking it within 9 hours of your planned bedtime, since you may find it harder to go to sleep. If it is almost time for your next dose, take only that dose. Do not take double or extra doses. What may interact with this medication? Do not take this medication with any of the following: MAOIs, such as Marplan, Nardil, and Parnate This medication may also  interact with the following: Certain medications for Parkinson disease, such as levodopa, pramipexole, ropinirole Medications that increase blood pressure or heart rate This list may not describe all possible interactions. Give your health care provider a list of all the medicines, herbs, non-prescription drugs, or dietary supplements you use. Also tell them if you smoke, drink alcohol, or use illegal drugs. Some items may interact with your medicine. What should I watch for while using this medication? Visit your care team for regular checks on your progress. Tell your care team if your symptoms do not start to get better or if they get worse. This medication has a risk of abuse and dependence. Your care team will check you for this while you take this medication. What side effects may I notice from receiving this medication? Side effects that you should report to your care team as soon as possible: Allergic reactions--skin rash, itching, hives, swelling of the face, lips, tongue, or throat Fast or irregular heartbeat Heart attack--pain or tightness in the chest, shoulders, arms, or jaw, nausea, shortness of breath, cold or clammy skin, feeling faint or lightheaded Increase in blood pressure Mood and behavior changes--anxiety, nervousness, confusion, hallucinations, irritability, hostility, thoughts of suicide or self-harm, worsening mood, feelings of depression Stroke--sudden numbness or weakness of the face, arm, or leg, trouble speaking, confusion, trouble walking, loss of balance or coordination, dizziness, severe headache, change in vision Side effects that usually do not require medical attention (report these to your care team if they continue or are bothersome): Anxiety, nervousness Headache Loss of appetite  Nausea Trouble sleeping This list may not describe all possible side effects. Call your doctor for medical advice about side effects. You may report side effects to FDA at  1-800-FDA-1088. Where should I keep my medication? Keep out of the reach of children and pets. This medication can be abused. Keep it in a safe place to protect it from theft. Do not share it with anyone. It is only for you. Selling or giving away this medication is dangerous and against the law. Store at room temperature between 20 and 25 degrees C (68 and 77 degrees F). This medication may cause harm or death if it is taken by other adults, children, or pets. It is important to get rid of the medication as soon as you no longer need it or it is expired. You can do this in two ways: Take the medication to a medication take-back program. Check with your pharmacy or law enforcement to find a location. If you cannot return the medication, check the label or package insert to see if the medication should be thrown out in the garbage or flushed down the toilet. If you are not sure, ask your care team. If it is safe to put it in the trash, empty the medication out of the container. Mix the medication with cat litter, dirt, or other unwanted substance. Seal the mixture in a bag or container. Put it in the trash. NOTE: This sheet is a summary. It may not cover all possible information. If you have questions about this medicine, talk to your doctor, pharmacist, or health care provider.  2023 Elsevier/Gold Standard (2021-07-21 00:00:00)

## 2021-11-30 ENCOUNTER — Encounter: Payer: Self-pay | Admitting: Neurology

## 2021-11-30 DIAGNOSIS — Z1231 Encounter for screening mammogram for malignant neoplasm of breast: Secondary | ICD-10-CM | POA: Diagnosis not present

## 2021-11-30 LAB — COMPREHENSIVE METABOLIC PANEL
ALT: 30 IU/L (ref 0–32)
AST: 23 IU/L (ref 0–40)
Albumin/Globulin Ratio: 2 (ref 1.2–2.2)
Albumin: 4.8 g/dL (ref 3.8–4.8)
Alkaline Phosphatase: 66 IU/L (ref 44–121)
BUN/Creatinine Ratio: 10 (ref 9–23)
BUN: 7 mg/dL (ref 6–20)
Bilirubin Total: 0.5 mg/dL (ref 0.0–1.2)
CO2: 22 mmol/L (ref 20–29)
Calcium: 9.6 mg/dL (ref 8.7–10.2)
Chloride: 105 mmol/L (ref 96–106)
Creatinine, Ser: 0.72 mg/dL (ref 0.57–1.00)
Globulin, Total: 2.4 g/dL (ref 1.5–4.5)
Glucose: 94 mg/dL (ref 70–99)
Potassium: 4.4 mmol/L (ref 3.5–5.2)
Sodium: 143 mmol/L (ref 134–144)
Total Protein: 7.2 g/dL (ref 6.0–8.5)
eGFR: 115 mL/min/{1.73_m2} (ref >59)

## 2021-11-30 NOTE — Progress Notes (Signed)
Pre MRI CMET test all normal, no objection or concerns regarding use of contrast agent.

## 2021-12-06 ENCOUNTER — Ambulatory Visit: Payer: BC Managed Care – PPO | Admitting: Family Medicine

## 2021-12-06 DIAGNOSIS — M542 Cervicalgia: Secondary | ICD-10-CM | POA: Diagnosis not present

## 2021-12-06 DIAGNOSIS — M62838 Other muscle spasm: Secondary | ICD-10-CM | POA: Diagnosis not present

## 2021-12-13 DIAGNOSIS — M542 Cervicalgia: Secondary | ICD-10-CM | POA: Diagnosis not present

## 2021-12-13 DIAGNOSIS — M62838 Other muscle spasm: Secondary | ICD-10-CM | POA: Diagnosis not present

## 2021-12-23 DIAGNOSIS — G4733 Obstructive sleep apnea (adult) (pediatric): Secondary | ICD-10-CM | POA: Diagnosis not present

## 2022-01-02 ENCOUNTER — Telehealth: Payer: Self-pay | Admitting: Neurology

## 2022-01-02 DIAGNOSIS — M542 Cervicalgia: Secondary | ICD-10-CM | POA: Diagnosis not present

## 2022-01-02 DIAGNOSIS — M62838 Other muscle spasm: Secondary | ICD-10-CM | POA: Diagnosis not present

## 2022-01-02 NOTE — Telephone Encounter (Signed)
90 mins MR brain w/wo & MR cervical w/wo Dr. Thomasene Lot Berkley Harvey: [id number] exp. 01/02/22-01/31/22 scheduled at [facility] 01/03/22 at 8:30am

## 2022-01-03 ENCOUNTER — Ambulatory Visit: Payer: BC Managed Care – PPO | Admitting: Neurology

## 2022-01-03 ENCOUNTER — Ambulatory Visit: Payer: BC Managed Care – PPO

## 2022-01-03 DIAGNOSIS — G4710 Hypersomnia, unspecified: Secondary | ICD-10-CM

## 2022-01-03 DIAGNOSIS — R6889 Other general symptoms and signs: Secondary | ICD-10-CM

## 2022-01-03 DIAGNOSIS — G47411 Narcolepsy with cataplexy: Secondary | ICD-10-CM

## 2022-01-03 DIAGNOSIS — H814 Vertigo of central origin: Secondary | ICD-10-CM | POA: Diagnosis not present

## 2022-01-03 DIAGNOSIS — R9089 Other abnormal findings on diagnostic imaging of central nervous system: Secondary | ICD-10-CM

## 2022-01-03 MED ORDER — GADOBENATE DIMEGLUMINE 529 MG/ML IV SOLN: 15.0000 mL | Freq: Once | INTRAVENOUS | Status: AC | PRN

## 2022-01-03 MED ORDER — GADOBENATE DIMEGLUMINE 529 MG/ML IV SOLN
15.0000 mL | Freq: Once | INTRAVENOUS | Status: AC | PRN
  Administered 2022-01-03: 15 mL via INTRAVENOUS

## 2022-01-04 ENCOUNTER — Telehealth: Payer: Self-pay | Admitting: *Deleted

## 2022-01-04 NOTE — Telephone Encounter (Signed)
-----   Message from Melvyn Novas, MD sent at 01/04/2022 12:39 PM EDT ----- The brain parenchyma shows scattered tiny punctate periventricular and subcortical nonspecific white matter hyperintensities on T2/flair which may be seen in a variety of conditions including migraine headaches. 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 Dr Pearlean Brownie.   Unchanged to 2022- I can offer the patient a LP- CSF evaluation for oligoclonal bands to rule out demyelinating disease. Her MRI gives no answer as to cause of dizziness.

## 2022-01-04 NOTE — Progress Notes (Signed)
The brain parenchyma shows scattered tiny punctate periventricular and subcortical nonspecific white matter hyperintensities on T2/flair which may be seen in a variety of conditions including migraine headaches. 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 Dr Pearlean Brownie.   Unchanged to 2022- I can offer the patient a LP- CSF evaluation for oligoclonal bands to rule out demyelinating disease. Her MRI gives no answer as to cause of dizziness.

## 2022-01-04 NOTE — Progress Notes (Signed)
Unremarkable MRI scan cervical spine with and without contrast. Some bulging of the disc between c5-c6.  No cord compression / no nerve impingement

## 2022-01-09 ENCOUNTER — Telehealth: Payer: BC Managed Care – PPO | Admitting: Nurse Practitioner

## 2022-01-09 ENCOUNTER — Other Ambulatory Visit: Payer: Self-pay | Admitting: *Deleted

## 2022-01-09 DIAGNOSIS — N3000 Acute cystitis without hematuria: Secondary | ICD-10-CM

## 2022-01-09 DIAGNOSIS — H814 Vertigo of central origin: Secondary | ICD-10-CM

## 2022-01-09 DIAGNOSIS — R9089 Other abnormal findings on diagnostic imaging of central nervous system: Secondary | ICD-10-CM

## 2022-01-09 DIAGNOSIS — R6889 Other general symptoms and signs: Secondary | ICD-10-CM

## 2022-01-09 MED ORDER — NITROFURANTOIN MONOHYD MACRO 100 MG PO CAPS: 100.0000 mg | ORAL_CAPSULE | Freq: Two times a day (BID) | ORAL | 0 refills | Status: AC

## 2022-01-09 NOTE — Progress Notes (Signed)
Virtual Visit Consent   Brandi Hall, you are scheduled for a virtual visit with a [facility] provider today. Just as with appointments in the office, your consent must be obtained to participate. Your consent will be active for this visit and any virtual visit you may have with one of our providers in the next 365 days. If you have a MyChart account, a copy of this consent can be sent to you electronically.  As this is a virtual visit, video technology does not allow for your provider to perform a traditional examination. This may limit your provider's ability to fully assess your condition. If your provider identifies any concerns that need to be evaluated in person or the need to arrange testing (such as labs, EKG, etc.), we will make arrangements to do so. Although advances in technology are sophisticated, we cannot ensure that it will always work on either your end or our end. If the connection with a video visit is poor, the visit may have to be switched to a telephone visit. With either a video or telephone visit, we are not always able to ensure that we have a secure connection.  By engaging in this virtual visit, you consent to the provision of healthcare and authorize for your insurance to be billed (if applicable) for the services provided during this visit. Depending on your insurance coverage, you may receive a charge related to this service.  I need to obtain your verbal consent now. Are you willing to proceed with your visit today? Brandi Hall has provided verbal consent on 01/09/2022 for a virtual visit (video or telephone). [name], FNP  Date: 01/09/2022 11:16 AM  Virtual Visit via Video Note   I, [name], connected with  Brandi Hall  ([id number], [birth year]) on 01/09/22 at 11:15 AM EDT by a video-enabled telemedicine application and verified that I am speaking with the correct person using two identifiers.  Location: Patient: Virtual Visit Location  Patient: Home Provider: Virtual Visit Location Provider: Home Office   I discussed the limitations of evaluation and management by telemedicine and the availability of in person appointments. The patient expressed understanding and agreed to proceed.    History of Present Illness: Brandi Hall is a 31 y.o. who identifies as a female who was assigned female at birth, and is being seen today with symptoms of urinary urgency, frequency and burning with urination.  These symptoms are consistent with when she has had a UTI in the past  She has used Deoderant in her vaginal area in the past used a new type last night that caused discomfort immediately after application. She attempted to remove the deodorant.   Denies fever, back pain nausea or vomiting.   Most recent UTI was over a year ago.   Denies having any issues with antibiotics in the past treating a UTI or complicated UTIs  Denies pregnancy or breastfeeding.      Problems:  Patient Active Problem List   Diagnosis Date Noted   Abnormal sensory exam 11/29/2021   Hypersomnia 11/29/2021   Vertigo of central origin 03/01/2021   OSA on CPAP 03/01/2021   Abnormal brain MRI 03/01/2021   Nasal septal deviation 02/22/2021   PCOS (polycystic ovarian syndrome) 12/15/2020   Narcolepsy with cataplexy 06/30/2020   Nose pain 12/10/2017   PCP NOTES >>>>>>>>>>>>>>>>>>> 07/17/2016   Status post nail surgery 03/10/2015   Abdominal pain, epigastric 03/11/2014   Annual physical exam 04/01/2012   ALLERGIC RHINITIS 07/06/2008  Asthma 07/06/2008    Allergies:  Allergies  Allergen Reactions   Gluten Meal Diarrhea   Soy Allergy Nausea And Vomiting   Wound Dressing Adhesive Rash    Glue on Bandaids   Medications:  Current Outpatient Medications:    acetaminophen (TYLENOL) 500 MG tablet, Take 1,000 mg by mouth every 6 (six) hours as needed for mild pain., Disp: , Rfl:    albuterol (VENTOLIN HFA) 108 (90 Base) MCG/ACT inhaler, INHALE  TWO PUFFS BY MOUTH EVERY 6 HOURS AS NEEDED FOR WHEEZING OR FOR SHORTNESS OF BREATH, Disp: 18 g, Rfl: 5   ALPRAZolam (XANAX) 0.25 MG tablet, Take 1 tablet (0.25 mg total) by mouth at bedtime as needed for anxiety., Disp: 30 tablet, Rfl: 0   dicyclomine (BENTYL) 10 MG capsule, Take 1 capsule (10 mg total) by mouth 3 (three) times daily as needed for spasms., Disp: 30 capsule, Rfl: 1   omeprazole (PRILOSEC) 40 MG capsule, Take 1 capsule (40 mg total) by mouth 2 (two) times daily before a meal., Disp: 120 capsule, Rfl: 0   OVER THE COUNTER MEDICATION, Take 4 capsules by mouth daily. Myo-inositol, Disp: , Rfl:    Solriamfetol HCl (SUNOSI) 150 MG TABS, Take 1 tablet by mouth daily after breakfast., Disp: 30 tablet, Rfl: 5   Tetrahydrozoline-Zn Sulfate (EYE DROPS ALLERGY RELIEF OP), Place 1 drop into both eyes daily as needed (allergy)., Disp: , Rfl:    triamcinolone (NASACORT) 55 MCG/ACT AERO nasal inhaler, Place 2 sprays into the nose daily. 2 sprays each nostril at night. (Patient taking differently: Place 2 sprays into the nose daily as needed (allergies).), Disp: 1 each, Rfl: 12 No current facility-administered medications for this visit.  Facility-Administered Medications Ordered in Other Visits:    gadobenate dimeglumine (MULTIHANCE) injection 15 mL, 15 mL, Intravenous, Once PRN, Dohmeier, Porfirio Mylar, MD  Observations/Objective: Patient is well-developed, well-nourished in no acute distress.  Resting comfortably  at home.  Head is normocephalic, atraumatic.  No labored breathing.  Speech is clear and coherent with logical content.  Patient is alert and oriented at baseline.    Assessment and Plan: 1. Acute cystitis without hematuria Push fluids  Avoid irritants to vaginal area as discussed  - nitrofurantoin, macrocrystal-monohydrate, (MACROBID) 100 MG capsule; Take 1 capsule (100 mg total) by mouth 2 (two) times daily for 5 days.  Dispense: 10 capsule; Refill: 0     Follow Up  Instructions: I discussed the assessment and treatment plan with the patient. The patient was provided an opportunity to ask questions and all were answered. The patient agreed with the plan and demonstrated an understanding of the instructions.  A copy of instructions were sent to the patient via MyChart unless otherwise noted below.    The patient was advised to call back or seek an in-person evaluation if the symptoms worsen or if the condition fails to improve as anticipated.  Time:  I spent 10 minutes with the patient via telehealth technology discussing the above problems/concerns.    [name], FNP

## 2022-01-11 DIAGNOSIS — M62838 Other muscle spasm: Secondary | ICD-10-CM | POA: Diagnosis not present

## 2022-01-11 DIAGNOSIS — M542 Cervicalgia: Secondary | ICD-10-CM | POA: Diagnosis not present

## 2022-01-23 ENCOUNTER — Ambulatory Visit
Admission: RE | Admit: 2022-01-23 | Discharge: 2022-01-23 | Disposition: A | Discharge status: Home / Self Care | Payer: BC Managed Care – PPO | Source: Ambulatory Visit | Attending: Neurology | Admitting: Neurology

## 2022-01-23 DIAGNOSIS — H814 Vertigo of central origin: Secondary | ICD-10-CM | POA: Diagnosis not present

## 2022-01-23 DIAGNOSIS — R9089 Other abnormal findings on diagnostic imaging of central nervous system: Secondary | ICD-10-CM

## 2022-01-23 DIAGNOSIS — R6889 Other general symptoms and signs: Secondary | ICD-10-CM | POA: Diagnosis not present

## 2022-01-23 DIAGNOSIS — R9402 Abnormal brain scan: Secondary | ICD-10-CM | POA: Diagnosis not present

## 2022-01-23 NOTE — Progress Notes (Signed)
1 vial of blood drawn from pts LAC to be sent off with LP lab work. 1 successful attempt, pt tolerated well. Gauze and tape applied after. 

## 2022-01-23 NOTE — Discharge Instructions (Signed)

## 2022-01-23 NOTE — Progress Notes (Signed)
Not enough cells to give differential picture, 1 red and one white blood cell -  A true non traumatic tap, very good fluid specimen   normal glucose & protein content,  we are waiting for oligoclonal Bands.

## 2022-01-24 ENCOUNTER — Encounter: Payer: Self-pay | Admitting: *Deleted

## 2022-01-27 LAB — CSF CULTURE W GRAM STAIN
MICRO NUMBER:: 13744139
Result:: NO GROWTH
SPECIMEN QUALITY:: ADEQUATE

## 2022-01-27 LAB — PROTEIN, CSF: Total Protein, CSF: 35 mg/dL (ref 15–45)

## 2022-01-27 LAB — CSF CELL COUNT WITH DIFFERENTIAL
RBC Count, CSF: 1 cells/uL — ABNORMAL HIGH
TOTAL NUCLEATED CELL: 1 cells/uL (ref 0–5)

## 2022-01-27 LAB — OLIGOCLONAL BANDS, CSF + SERM: Oligo Bands: ABSENT

## 2022-01-27 LAB — GLUCOSE, CSF: Glucose, CSF: 62 mg/dL (ref 40–80)

## 2022-01-30 NOTE — Progress Notes (Signed)
No oligoclonal bands were seen-  These would be expected in demyelinating CNS disorders-  speaking against a MS diagnosis.

## 2022-02-01 DIAGNOSIS — M542 Cervicalgia: Secondary | ICD-10-CM | POA: Diagnosis not present

## 2022-02-01 DIAGNOSIS — M62838 Other muscle spasm: Secondary | ICD-10-CM | POA: Diagnosis not present

## 2022-02-09 DIAGNOSIS — M5481 Occipital neuralgia: Secondary | ICD-10-CM | POA: Diagnosis not present

## 2022-02-16 ENCOUNTER — Other Ambulatory Visit: Payer: Self-pay | Admitting: Family Medicine

## 2022-02-16 MED ORDER — ALPRAZOLAM 0.25 MG PO TABS: 0.2500 mg | ORAL_TABLET | Freq: Every evening | ORAL | 0 refills | Status: DC | PRN

## 2022-02-24 DIAGNOSIS — M542 Cervicalgia: Secondary | ICD-10-CM | POA: Diagnosis not present

## 2022-03-03 DIAGNOSIS — M62838 Other muscle spasm: Secondary | ICD-10-CM | POA: Diagnosis not present

## 2022-03-03 DIAGNOSIS — M542 Cervicalgia: Secondary | ICD-10-CM | POA: Diagnosis not present

## 2022-03-27 ENCOUNTER — Telehealth: Payer: Self-pay | Admitting: Internal Medicine

## 2022-03-27 MED ORDER — ALBUTEROL SULFATE HFA 108 (90 BASE) MCG/ACT IN AERS: 2.0000 | INHALATION_SPRAY | Freq: Four times a day (QID) | RESPIRATORY_TRACT | 5 refills | Status: DC | PRN

## 2022-03-27 NOTE — Telephone Encounter (Signed)
Rx for albuterol inhaler sent.

## 2022-03-27 NOTE — Telephone Encounter (Signed)
Medication: albuterol (PROVENTIL) (2.5 MG/3ML) 0.083% nebulizer solution 3 mL   [[id number]] --appears discontinued  Preferred Pharmacy (with phone number or street name):  [id number] - [address], [place] [address] Phone: [phone]  Fax: [phone] DEA #: --   Agent: Please be advised that RX refills may take up to 3 business days. We ask that you follow-up with your pharmacy.

## 2022-03-27 NOTE — Telephone Encounter (Signed)
Please advise 

## 2022-03-27 NOTE — Telephone Encounter (Signed)
Advise patient: Recommend to use inhalers.  Send RF if needed. If she has exacerbation requiring albuterol I will recommend a office visit.

## 2022-03-28 MED ORDER — ALBUTEROL SULFATE HFA 108 (90 BASE) MCG/ACT IN AERS: 2.0000 | INHALATION_SPRAY | Freq: Four times a day (QID) | RESPIRATORY_TRACT | 5 refills | Status: DC | PRN

## 2022-03-28 NOTE — Addendum Note (Signed)
Addended by: Damita Dunnings D on: 03/28/2022 09:15 AM   Modules accepted: Orders

## 2022-03-29 ENCOUNTER — Ambulatory Visit: Payer: BC Managed Care – PPO | Admitting: Family Medicine

## 2022-03-29 DIAGNOSIS — M542 Cervicalgia: Secondary | ICD-10-CM | POA: Diagnosis not present

## 2022-03-29 DIAGNOSIS — M62838 Other muscle spasm: Secondary | ICD-10-CM | POA: Diagnosis not present

## 2022-04-07 DIAGNOSIS — M542 Cervicalgia: Secondary | ICD-10-CM | POA: Diagnosis not present

## 2022-04-07 DIAGNOSIS — M62838 Other muscle spasm: Secondary | ICD-10-CM | POA: Diagnosis not present

## 2022-04-14 DIAGNOSIS — Z6828 Body mass index (BMI) 28.0-28.9, adult: Secondary | ICD-10-CM | POA: Diagnosis not present

## 2022-04-14 DIAGNOSIS — R1032 Left lower quadrant pain: Secondary | ICD-10-CM | POA: Diagnosis not present

## 2022-04-14 DIAGNOSIS — Z3041 Encounter for surveillance of contraceptive pills: Secondary | ICD-10-CM | POA: Diagnosis not present

## 2022-04-14 DIAGNOSIS — Z01419 Encounter for gynecological examination (general) (routine) without abnormal findings: Secondary | ICD-10-CM | POA: Diagnosis not present

## 2022-04-18 ENCOUNTER — Other Ambulatory Visit: Payer: Self-pay | Admitting: Neurology

## 2022-04-25 DIAGNOSIS — Z6828 Body mass index (BMI) 28.0-28.9, adult: Secondary | ICD-10-CM | POA: Diagnosis not present

## 2022-04-25 DIAGNOSIS — Z713 Dietary counseling and surveillance: Secondary | ICD-10-CM | POA: Diagnosis not present

## 2022-04-26 ENCOUNTER — Ambulatory Visit (INDEPENDENT_AMBULATORY_CARE_PROVIDER_SITE_OTHER): Payer: BC Managed Care – PPO | Admitting: Internal Medicine

## 2022-04-26 ENCOUNTER — Other Ambulatory Visit (HOSPITAL_COMMUNITY): Payer: Self-pay

## 2022-04-26 ENCOUNTER — Encounter: Payer: Self-pay | Admitting: Internal Medicine

## 2022-04-26 VITALS — BP 126/74 | HR 102 | Temp 98.0°F | Resp 18 | Ht 66.0 in | Wt 173.5 lb

## 2022-04-26 DIAGNOSIS — J4530 Mild persistent asthma, uncomplicated: Secondary | ICD-10-CM

## 2022-04-26 DIAGNOSIS — G47411 Narcolepsy with cataplexy: Secondary | ICD-10-CM

## 2022-04-26 MED ORDER — PREDNISONE 10 MG PO TABS: ORAL_TABLET | ORAL | 0 refills | Status: DC

## 2022-04-26 MED ORDER — WEGOVY 1 MG/0.5ML ~~LOC~~ SOAJ
1.0000 mg | SUBCUTANEOUS | 0 refills | Status: DC
  Filled 2022-04-26 – 2022-05-01 (×2): qty 2, 28d supply, fill #0

## 2022-04-26 MED ORDER — FLUTICASONE-SALMETEROL 100-50 MCG/ACT IN AEPB: 1.0000 | INHALATION_SPRAY | Freq: Two times a day (BID) | RESPIRATORY_TRACT | 3 refills | Status: DC

## 2022-04-26 NOTE — Patient Instructions (Addendum)
Start taking Advair twice daily.  Prescription sent  Use Nasonex daily consistently  Take Allegra 60 mg (antihistaminic) twice daily for the next week, then twice daily as needed  You still can use albuterol 2 puffs every 4 hours as needed  Take prednisone for few days.  Prescription sent  Call if your asthma symptoms are not gradually better  Schedule a follow-up for a physical exam in 1 month

## 2022-04-26 NOTE — Progress Notes (Unsigned)
Subjective:    Patient ID: Brandi Hall, female    DOB: 11-Jan-1991, 31 y.o.   MRN: [id number]  DOS:  04/26/2022 Type of visit - description: acute  Has a long history of asthma, for the last 2 months it has not been well controlled. Has been reaching out to albuterol more frequently.  Last weekend, went [place] a farm with her children and immediately develop cough, wheezing, some shortness of breath and palpitations. Albuterol is not working as well as before.  She denies fever chills Had  sinus pain and congestion but not recently No acid reflux or nausea or vomiting.  She is slightly tachycardic, no recent airplane trip, calf pain or swelling.  Review of Systems See above   Past Medical History:  Diagnosis Date   Allergic rhinitis    Anxiety    Asthma    Bronchitis    Lymph node abscess    hosp for infected lymph node (Salmonella) at 31 y/o   PCOS (polycystic ovarian syndrome)    Pneumonia    Post partum depression    Post term pregnancy, 41 weeks 12/18/2014   Postpartum care following vaginal delivery (7/1) 12/18/2014   Sleep apnea    CPAP @ [facility]    Past Surgical History:  Procedure Laterality Date   LAD bx     MASS EXCISION Right 02/22/2021   Procedure: EXCISION RIGHT NASAL POLYPOID MASS;  Surgeon: Drema Halon, MD;  Location: [facility] OR;  Service: ENT;  Laterality: Right;   NASAL SEPTOPLASTY W/ TURBINOPLASTY N/A 02/22/2021   Procedure: NASAL SEPTOPLASTY WITH TURBINATE REDUCTION;  Surgeon: Drema Halon, MD;  Location: [facility] OR;  Service: ENT;  Laterality: N/A;   WISDOM TOOTH EXTRACTION      Current Outpatient Medications  Medication Instructions   acetaminophen (TYLENOL) 1,000 mg, Oral, Every 6 hours PRN   albuterol (VENTOLIN HFA) 108 (90 Base) MCG/ACT inhaler 2 puffs, Inhalation, Every 6 hours PRN   ALPRAZolam (XANAX) 0.25 mg, Oral, At bedtime PRN   dicyclomine (BENTYL) 10 mg, Oral, 3 times daily PRN   omeprazole (PRILOSEC) 40 mg, Oral, 2 times daily  before meals   OVER THE COUNTER MEDICATION 4 capsules, Oral, Daily, Myo-inositol   Solriamfetol HCl (SUNOSI) 150 MG TABS 1 tablet, Oral, Daily after breakfast   Tetrahydrozoline-Zn Sulfate (EYE DROPS ALLERGY RELIEF OP) 1 drop, Both Eyes, Daily PRN   triamcinolone (NASACORT) 55 MCG/ACT AERO nasal inhaler 2 sprays, Nasal, Daily, 2 sprays each nostril at night.       Objective:   Physical Exam BP 126/74   Pulse (!) 102   Temp 98 F (36.7 C) (Oral)   Resp 18   Ht 5\' 6"  (1.676 m)   Wt 173 lb 8 oz (78.7 kg)   LMP 04/25/2022 (Exact Date)   SpO2 95%   BMI 28.00 kg/m  General:   Well developed, NAD, BMI noted. HEENT:  Normocephalic . Face symmetric, atraumatic Lungs:  Slightly increased expiratory time, very few rhonchi with cough. Normal respiratory effort, no intercostal retractions, no accessory muscle use. Heart: RRR,  no murmur.  Lower extremities: no pretibial edema bilaterally.  Calves symmetric Skin: Not pale. Not jaundice Neurologic:  alert & oriented X3.  Speech normal, gait appropriate for age and unassisted Psych--  Cognition and judgment appear intact.  Cooperative with normal attention span and concentration.  Behavior appropriate. No anxious or depressed appearing.      Assessment     Assessment Anxiety Asthma, allergies H/o lymph  node abscesses d/t Salmonella at age 60 Gluten intolerant, on a gluten free diet  OSA, per sleep study 07-2020 group, Rx CPAP, not using it as off 11/2020. Narcolepsi-- Sunosi FH breast ca mother age 75 (reportedly neg genetic testing) , personal h/o dense breast tissue  IBS self dx sx since teenage years   PLAN: Asthma: Not well controlled for the last 2 months, denies fever chills, no GERD symptoms.  Currently on albuterol only which is not helping as much as before. Plan: Advair twice daily, Nasonex consistently, switch antihistaminics to Allegra 60 mg twice daily for 1 week and then as needed, round of prednisone. Call if  not gradually better.  Reassess on RTC ADHD?  Also, reports she noticed multiple symptoms that could be ADHD related, in light of narcolepsy and the use of Sunosi I rec to d/w neurology the issue.  She is in agreement. RTC CPX 1 month.  We will talk about vaccinations at the time

## 2022-04-27 ENCOUNTER — Other Ambulatory Visit: Payer: Self-pay | Admitting: Internal Medicine

## 2022-04-27 ENCOUNTER — Encounter: Payer: Self-pay | Admitting: Internal Medicine

## 2022-04-27 MED ORDER — BUDESONIDE-FORMOTEROL FUMARATE 160-4.5 MCG/ACT IN AERO: 2.0000 | INHALATION_SPRAY | Freq: Two times a day (BID) | RESPIRATORY_TRACT | 5 refills | Status: DC

## 2022-04-27 NOTE — Assessment & Plan Note (Signed)
Asthma: Not well controlled for the last 2 months, denies fever chills, no GERD symptoms.  Currently on albuterol only which is not helping as much as before. Plan: Advair twice daily, Nasonex consistently, switch antihistaminics to Allegra 60 mg twice daily for 1 week and then as needed, round of prednisone. Call if not gradually better.  Reassess on RTC ADHD?  Also, reports she noticed multiple symptoms that could be ADHD related, in light of narcolepsy and the use of Sunosi I rec to d/w neurology the issue.  She is in agreement. RTC CPX 1 month.  We will talk about vaccinations at the time
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
## 2022-05-01 ENCOUNTER — Other Ambulatory Visit (HOSPITAL_COMMUNITY): Payer: Self-pay

## 2022-05-10 DIAGNOSIS — M5481 Occipital neuralgia: Secondary | ICD-10-CM | POA: Diagnosis not present

## 2022-05-10 DIAGNOSIS — M542 Cervicalgia: Secondary | ICD-10-CM | POA: Diagnosis not present

## 2022-05-12 ENCOUNTER — Other Ambulatory Visit (HOSPITAL_COMMUNITY): Payer: Self-pay

## 2022-05-14 ENCOUNTER — Other Ambulatory Visit: Payer: Self-pay | Admitting: Neurology

## 2022-05-15 MED ORDER — ALPRAZOLAM 0.25 MG PO TABS: 0.2500 mg | ORAL_TABLET | Freq: Every evening | ORAL | 0 refills | Status: DC | PRN

## 2022-05-15 NOTE — Telephone Encounter (Signed)
Patient is due for a refill on xanax. Patient is up to date on her appointments. [place] Controlled Substance Registry checked and is appropriate.  

## 2022-05-17 DIAGNOSIS — R92333 Mammographic heterogeneous density, bilateral breasts: Secondary | ICD-10-CM | POA: Diagnosis not present

## 2022-05-17 DIAGNOSIS — Z9189 Other specified personal risk factors, not elsewhere classified: Secondary | ICD-10-CM | POA: Diagnosis not present

## 2022-05-17 DIAGNOSIS — Z803 Family history of malignant neoplasm of breast: Secondary | ICD-10-CM | POA: Diagnosis not present

## 2022-05-20 DIAGNOSIS — M5481 Occipital neuralgia: Secondary | ICD-10-CM | POA: Diagnosis not present

## 2022-06-08 ENCOUNTER — Encounter: Payer: BC Managed Care – PPO | Admitting: Internal Medicine

## 2022-07-04 DIAGNOSIS — Z6827 Body mass index (BMI) 27.0-27.9, adult: Secondary | ICD-10-CM | POA: Diagnosis not present

## 2022-07-04 DIAGNOSIS — Z713 Dietary counseling and surveillance: Secondary | ICD-10-CM | POA: Diagnosis not present

## 2022-07-07 ENCOUNTER — Ambulatory Visit (INDEPENDENT_AMBULATORY_CARE_PROVIDER_SITE_OTHER): Payer: BC Managed Care – PPO | Admitting: Internal Medicine

## 2022-07-07 ENCOUNTER — Encounter: Payer: Self-pay | Admitting: Internal Medicine

## 2022-07-07 VITALS — BP 116/68 | HR 81 | Temp 98.3°F | Resp 16 | Ht 66.0 in | Wt 171.1 lb

## 2022-07-07 DIAGNOSIS — E785 Hyperlipidemia, unspecified: Secondary | ICD-10-CM | POA: Diagnosis not present

## 2022-07-07 DIAGNOSIS — Z0000 Encounter for general adult medical examination without abnormal findings: Secondary | ICD-10-CM

## 2022-07-07 DIAGNOSIS — Z23 Encounter for immunization: Secondary | ICD-10-CM | POA: Diagnosis not present

## 2022-07-07 NOTE — Progress Notes (Signed)
Subjective:    Patient ID: Brandi Hall, female    DOB: 05-Jul-1990, 32 y.o.   MRN: [id number]  DOS:  07/07/2022 Type of visit - description: CPX Here for CPX. Feeling well. No major concerns.  Review of Systems   A 14 point review of systems is negative    Past Medical History:  Diagnosis Date   Allergic rhinitis    Anxiety    Asthma    Bronchitis    Lymph node abscess    hosp for infected lymph node (Salmonella) at 32 y/o   PCOS (polycystic ovarian syndrome)    Pneumonia    Post partum depression    Post term pregnancy, 41 weeks 12/18/2014   Postpartum care following vaginal delivery (7/1) 12/18/2014   Sleep apnea    CPAP @ HS    Past Surgical History:  Procedure Laterality Date   LAD bx     MASS EXCISION Right 02/22/2021   Procedure: EXCISION RIGHT NASAL POLYPOID MASS;  Surgeon: Rozetta Nunnery, MD;  Location: [facility];  Service: ENT;  Laterality: Right;   NASAL SEPTOPLASTY W/ TURBINOPLASTY N/A 02/22/2021   Procedure: NASAL SEPTOPLASTY WITH TURBINATE REDUCTION;  Surgeon: Rozetta Nunnery, MD;  Location: [facility];  Service: ENT;  Laterality: N/A;   [place] EXTRACTION       Social History   Social History Narrative   Live with husband 2 daughter - 2016 , 2020   Right handed   Caffeine: 3 cups of coffee a day and 1 tea or soda a day   Social History   Tobacco Use   Smoking status: Never   Smokeless tobacco: Never  Vaping Use   Vaping Use: Never used  Substance Use Topics   Alcohol use: Yes    Alcohol/week: 0.0 standard drinks of alcohol    Comment: rarely   Drug use: No        Current Outpatient Medications  Medication Instructions   acetaminophen (TYLENOL) 1,000 mg, Oral, Every 6 hours PRN   albuterol (VENTOLIN HFA) 108 (90 Base) MCG/ACT inhaler 2 puffs, Inhalation, Every 6 hours PRN   ALPRAZolam (XANAX) 0.25 mg, Oral, At bedtime PRN   dicyclomine (BENTYL) 10 mg, Oral, 3 times daily PRN   fluticasone-salmeterol (ADVAIR) 100-50  MCG/ACT AEPB 1 puff, Inhalation, 2 times daily   omeprazole (PRILOSEC) 40 mg, Oral, 2 times daily before meals   OVER THE COUNTER MEDICATION 4 capsules, Oral, Daily, Myo-inositol   Solriamfetol HCl (SUNOSI) 150 MG TABS 1 tablet, Oral, Daily after breakfast   Tetrahydrozoline-Zn Sulfate (EYE DROPS ALLERGY RELIEF OP) 1 drop, Daily PRN   triamcinolone (NASACORT) 55 MCG/ACT AERO nasal inhaler 2 sprays, Nasal, Daily, 2 sprays each nostril at night.   Wegovy 1 mg, Subcutaneous, Weekly       Objective:   Physical Exam BP 116/68   Pulse 81   Temp 98.3 F (36.8 C) (Oral)   Resp 16   Ht 5\' 6"  (1.676 m)   Wt 171 lb 2 oz (77.6 kg)   SpO2 97%   BMI 27.62 kg/m  General: Well developed, NAD, BMI noted Neck: No  thyromegaly  HEENT:  Normocephalic . Face symmetric, atraumatic Lungs:  CTA B Normal respiratory effort, no intercostal retractions, no accessory muscle use. Heart: RRR,  no murmur.  Abdomen:  Not distended, soft, non-tender. No rebound or rigidity.   Lower extremities: no pretibial edema bilaterally  Skin: Exposed areas without rash. Not pale. Not jaundice Neurologic:  alert &  oriented X3.  Speech normal, gait appropriate for age and unassisted Strength symmetric and appropriate for age.  Psych: Cognition and judgment appear intact.  Cooperative with normal attention span and concentration.  Behavior appropriate. No anxious or depressed appearing.     Assessment     Assessment Anxiety Asthma, allergies H/o lymph node abscesses d/t Salmonella at age 69 Gluten intolerant, on a gluten free diet  OSA, per sleep study 07-2020 group, Rx CPAP, using it as off 06-2022 Narcolepsi-- Sunosi FH breast ca mother age 28 (reportedly neg genetic testing) , personal h/o dense breast tissue  IBS self dx sx since teenage years Abnormal brain MRI 2023, LP-2023: No evidence of MS Birth control: BCP  PLAN: Here for CPX Anxiety: Currently doing well. Asthma: Under excellent control,  okay to decrease Advair to daily and then as needed if possible. OSA: Using CPAP regularly Dizziness, abnormal MRI brain: Had a follow-up MRI with contrast 12-2021, abnormal, LP done >> showed no evidence of MS. uses Xanax rarely for dizzines, plans to see neurology. RTC 1 year

## 2022-07-07 NOTE — Patient Instructions (Addendum)
Vaccines I recommend:  Covid booster Flu shot     GO TO THE LAB : Get the blood work     GO TO THE FRONT DESK, [place] Come back for   physical exam in 1 year

## 2022-07-08 LAB — LIPID PANEL
Cholesterol: 186 mg/dL (ref <200)
HDL: 67 mg/dL (ref >=50)
LDL Cholesterol (Calc): 101 mg/dL (calc) — ABNORMAL HIGH
Non-HDL Cholesterol (Calc): 119 mg/dL (calc) (ref <130)
Total CHOL/HDL Ratio: 2.8 (calc) (ref <5.0)
Triglycerides: 85 mg/dL (ref <150)

## 2022-07-09 ENCOUNTER — Encounter: Payer: Self-pay | Admitting: Internal Medicine

## 2022-07-09 NOTE — Assessment & Plan Note (Signed)
Here for CPX Anxiety: Currently doing well. Asthma: Under excellent control, okay to decrease Advair to daily and then as needed if possible. OSA: Using CPAP regularly Dizziness, abnormal MRI brain: Had a follow-up MRI with contrast 12-2021, abnormal, LP done >> showed no evidence of MS. uses Xanax rarely for dizzines, plans to see neurology. RTC 1 year

## 2022-07-09 NOTE — Assessment & Plan Note (Signed)
-  Td due date 06-2022 - pnm 23: 2015 (asthma) -COVID VAX and flu shot recommended -Female care per gynecology, Pap smear 11/17/2020 ([facility]) -Labs: FLP. -Diet and exercise: discussed - + FH breast cancer: Bilateral MRI w/ w/o contrast 05-2021, [facility]  11/2021.  Follow-up elsewhere.

## 2022-07-27 ENCOUNTER — Other Ambulatory Visit (HOSPITAL_BASED_OUTPATIENT_CLINIC_OR_DEPARTMENT_OTHER): Payer: Self-pay

## 2022-07-27 MED ORDER — SEMAGLUTIDE-WEIGHT MANAGEMENT 0.5 MG/0.5ML ~~LOC~~ SOAJ
0.5000 mg | SUBCUTANEOUS | 0 refills | Status: DC
  Filled 2022-07-27: qty 2, 28d supply, fill #0

## 2022-07-28 ENCOUNTER — Other Ambulatory Visit (HOSPITAL_BASED_OUTPATIENT_CLINIC_OR_DEPARTMENT_OTHER): Payer: Self-pay

## 2022-08-01 ENCOUNTER — Encounter: Payer: Self-pay | Admitting: Neurology

## 2022-08-01 ENCOUNTER — Other Ambulatory Visit (HOSPITAL_BASED_OUTPATIENT_CLINIC_OR_DEPARTMENT_OTHER): Payer: Self-pay

## 2022-08-02 ENCOUNTER — Ambulatory Visit (INDEPENDENT_AMBULATORY_CARE_PROVIDER_SITE_OTHER): Payer: BC Managed Care – PPO | Admitting: Neurology

## 2022-08-02 ENCOUNTER — Other Ambulatory Visit (HOSPITAL_BASED_OUTPATIENT_CLINIC_OR_DEPARTMENT_OTHER): Payer: Self-pay

## 2022-08-02 ENCOUNTER — Encounter: Payer: Self-pay | Admitting: Neurology

## 2022-08-02 ENCOUNTER — Other Ambulatory Visit: Payer: Self-pay | Admitting: Neurology

## 2022-08-02 VITALS — BP 124/75 | HR 93 | Ht 65.0 in | Wt 172.0 lb

## 2022-08-02 DIAGNOSIS — G43809 Other migraine, not intractable, without status migrainosus: Secondary | ICD-10-CM

## 2022-08-02 DIAGNOSIS — G4710 Hypersomnia, unspecified: Secondary | ICD-10-CM

## 2022-08-02 DIAGNOSIS — F988 Other specified behavioral and emotional disorders with onset usually occurring in childhood and adolescence: Secondary | ICD-10-CM | POA: Diagnosis not present

## 2022-08-02 DIAGNOSIS — H814 Vertigo of central origin: Secondary | ICD-10-CM | POA: Diagnosis not present

## 2022-08-02 DIAGNOSIS — G4733 Obstructive sleep apnea (adult) (pediatric): Secondary | ICD-10-CM | POA: Diagnosis not present

## 2022-08-02 MED ORDER — TRIAMCINOLONE ACETONIDE 55 MCG/ACT NA AERO: 2.0000 | INHALATION_SPRAY | Freq: Every day | NASAL | 12 refills | Status: AC

## 2022-08-02 MED ORDER — AMPHETAMINE-DEXTROAMPHET ER 10 MG PO CP24: 10.0000 mg | ORAL_CAPSULE | Freq: Every day | ORAL | 0 refills | Status: DC

## 2022-08-02 MED ORDER — AMPHETAMINE-DEXTROAMPHET ER 10 MG PO CP24
10.0000 mg | ORAL_CAPSULE | Freq: Every day | ORAL | 0 refills | Status: DC
  Filled 2022-08-02: qty 30, 30d supply, fill #0
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
## 2022-08-02 NOTE — Patient Instructions (Signed)
Amphetamine; Dextroamphetamine Tablets What is this medication? AMPHETAMINE; DEXTROAMPHETAMINE (am FET a meen; dex troe am FET a meen) treats attention-deficit hyperactivity disorder (ADHD). It works by improving focus and reducing impulsive behavior. It may also be used to treat narcolepsy. It works by promoting wakefulness. It belongs to a group of medications called stimulants. This medicine may be used for other purposes; ask your health care provider or pharmacist if you have questions. COMMON BRAND NAME(S): Adderall What should I tell my care team before I take this medication? They need to know if you have any of these conditions: Anxiety or panic attacks Circulation problems in fingers and toes (Raynaud's disease) Glaucoma Heart attack Heart disease High blood pressure History of alcohol or drug abuse or addiction Kidney disease Liver disease Mental health disease Previous suicide attempt by you or a family member Seizures Stroke Suicidal thoughts, plans, or attempt Thyroid disease Tourette's syndrome An unusual or allergic reaction to dextroamphetamine, other amphetamines, other medications, foods, dyes, or preservatives Pregnant or trying to get pregnant Breast-feeding How should I use this medication? Take this medication by mouth. Take it as directed on the prescription label at the same time every day. You can take it with or without food. If it upsets your stomach, take it with food. Keep taking it unless your care team tells you to stop. A special MedGuide will be given to you by the pharmacist with each prescription and refill. Be sure to read this information carefully each time. Talk to your care team about the use of this medication in children. While it may be prescribed for children as young as 3 years for selected conditions, precautions do apply. Overdosage: If you think you have taken too much of this medicine contact a poison control center or emergency room at  once. NOTE: This medicine is only for you. Do not share this medicine with others. What if I miss a dose? If you miss a dose, take it as soon as you can. If it is almost time for your next dose, take only that dose. Do not take double or extra doses. What may interact with this medication? Do not take this medication with any of the following: Linezolid MAOIs like Carbex, Eldepryl, Marplan, Nardil, and Parnate Methylene blue (injected into a vein) Other stimulant medications for attention disorders, weight loss, or to stay awake This medication may also interact with the following: Acetazolamide Ammonium chloride Ascorbic acid Atomoxetine Certain medications for blood pressure, heart disease, irregular heart beat Certain medications for depression, anxiety, or psychotic disturbances Cold or allergy medications Lithium Methenamine Narcotic medicines for pain Quinidine Ritonavir Sodium bicarbonate St. John's Wort Tryptophan This list may not describe all possible interactions. Give your health care provider a list of all the medicines, herbs, non-prescription drugs, or dietary supplements you use. Also tell them if you smoke, drink alcohol, or use illegal drugs. Some items may interact with your medicine. What should I watch for while using this medication? Visit your care team for regular checks on your progress. This prescription requires that you follow special procedures with your care team and pharmacy. You will need to have a new written prescription from your care team every time you need a refill. This medication has a risk of abuse and dependence. Your care team will check you for this while you take this medication. Long term use of this medication may cause your brain and body to depend on it. This does not usually happen if you take breaks  from this medication during weekends, holidays, or summer vacations. Talk to your care team about what works for you. If your care team  wants you to stop this medication permanently, the dose may be slowly lowered over time to reduce the risk of side effects. This medication may affect your concentration, or hide signs of tiredness. Until you know how this medication affects you, do not drive, ride a bicycle, use machinery, or do anything that needs mental alertness. Alcohol should be avoided with some brands of this medication. Talk to your care team if you have questions. Tell your care team if this medication loses its effects, or if you feel you need to take more than the prescribed amount. Do not change the dosage without talking to your care team. Decreased appetite is a common side effect when starting this medication. Eating small, frequent meals or snacks can help. Talk to your care team if you continue to have poor eating habits. Height and weight growth of a child taking this medication will be monitored closely. Do not take this medication close to bedtime. It may prevent you from sleeping. Tell your care team right away if you notice unexplained wounds on your fingers and toes while taking this medication. You should also tell your care team if you experience numbness or pain, changes in the skin color, or sensitivity to temperature in your fingers or toes. What side effects may I notice from receiving this medication? Side effects that you should report to your care team as soon as possible: Allergic reactions--skin rash, itching, hives, swelling of the face, lips, tongue, or throat Raynaud's--cool, numb, or painful fingers or toes that may change color from pale, to blue, to red Heart rhythm changes--fast or irregular heartbeat, dizziness, feeling faint or lightheaded, chest pain, trouble breathing Increase in blood pressure Mood and behavior changes--anxiety, nervousness, confusion, hallucinations, hostility, irritability, thoughts of suicide or self-harm, worsening mood, feelings of depression Painful or prolonged  erections Seizures Stroke in adults--sudden numbness or weakness of the face, arm or leg, trouble speaking, confusion, trouble walking, loss of balance or coordination, dizziness, severe headache, change in vision Side effects that usually do not require medical attention (report to your care team if they continue or are bothersome): Blurry vision Headache Loss of appetite Nausea Trouble sleeping Weight loss This list may not describe all possible side effects. Call your doctor for medical advice about side effects. You may report side effects to FDA at 1-800-FDA-1088. Where should I keep my medication? Keep out of the reach of children and pets. This medication can be abused. Keep it in a safe place to protect it from theft. Do not share it with anyone. It is only for you. Selling or giving away this medication is dangerous and against the law. Store at room temperature between 20 and 25 degrees C (68 and 77 degrees F). Protect from light and moisture. Keep container tightly closed. Get rid of any unused medication after the expiration date. This medication may cause harm and death if it is taken by other adults, children, or pets. It is important to get rid of the medication as soon as you no longer need it, or it is expired. You can do this in two ways: Take the medication to a medication take-back program. Check with your pharmacy or law enforcement to find a location. If you cannot return the medication, check the label or package insert to see if the medication should be thrown out in the garbage  or flushed down the toilet. If you are not sure, ask your care team. If it is safe to put it in the trash, take the medication out of the container. Mix the medication with cat litter, dirt, coffee grounds, or other unwanted substance. Seal the mixture in a bag or container. Put it in the trash. NOTE: This sheet is a summary. It may not cover all possible information. If you have questions about this  medicine, talk to your doctor, pharmacist, or health care provider.  2023 Elsevier/Gold Standard (2004-07-08 00:00:00) Living With Attention Deficit Hyperactivity Disorder If you have been diagnosed with attention deficit hyperactivity disorder (ADHD), you may be relieved that you now know why you have felt or behaved a certain way. Still, you may feel overwhelmed about the treatment ahead. You may also wonder how to get the support you need and how to deal with the condition day-to-day. With treatment and support, you can live with ADHD and manage your symptoms. How to manage lifestyle changes Managing lifestyle changes can be challenging. Seeking support from your healthcare provider, therapist, family, and friends can be helpful. How to recognize changes in your condition The following signs may mean that your treatment is working well and your condition is improving: Consistently being on time for appointments. Being more organized at home and work. Other people noticing improvements in your behavior. Achieving goals that you set for yourself. Thinking more clearly. The following signs may mean that your treatment is not working very well: Feeling impatience or more confusion. Missing, forgetting, or being late for appointments. An increasing sense of disorganization and messiness. More difficulty in reaching goals that you set for yourself. Loved ones becoming angry or frustrated with you. Follow these instructions at home: Medicines Take over-the-counter and prescription medicines only as told by your health care provider. Check with your health care provider before taking any new medicines. General instructions Create structure and an organized atmosphere at home. For example: Make a list of tasks, then rank them from most important to least important. Work on one task at a time until your listed tasks are done. Make a daily schedule and follow it consistently every day. Use an  appointment calendar, and check it 2-3 times a day to keep on track. Keep it with you when you leave the house. Create spaces where you keep certain things, and always put things back in their places after you use them. Keep all follow-up visits. Your health care provider will need to monitor your condition and adjust your treatment over time. Where to find support Talking to others  Keep emotion out of important discussions and speak in a calm, logical way. Listen closely and patiently to your loved ones. Try to understand their point of view, and try to avoid getting defensive. Take responsibility for the consequences of your actions. Ask that others do not take your behaviors personally. Aim to solve problems as they come up, and express your feelings instead of bottling them up. Talk openly about what you need from your loved ones and how they can support you. Consider going to family therapy sessions or having your family meet with a specialist who deals with ADHD-related behavior problems. Finances Not all insurance plans cover mental health care, so it is important to check with your insurance carrier. If paying for co-pays or counseling services is a problem, search for a local or county mental health care center. Public mental health care services may be offered there at a  low cost or no cost when you are not able to see a private health care provider. If you are taking medicine for ADHD, you may be able to get the generic form, which may be less expensive than brand-name medicine. Some makers of prescription medicines also offer help to patients who cannot afford the medicines that they need. Therapy and support groups Talking with a mental health care provider and participating in support groups can help to improve your quality of life, daily functioning, and overall symptoms. Questions to ask your health care provider: What are the risks and benefits of taking medicines? Would I  benefit from therapy? How often should I follow up with a health care provider? Where to find more information Learn more about ADHD from: Children and Adults with Attention Deficit Hyperactivity Disorder: [employer]add[employer] of Mental Health: https://www.frey.org/ Centers for Disease Control and Prevention: StoreMirror.com.cy Contact a health care provider if: You have side effects from your medicines, such as: Repeated muscle twitches, coughing, or speech outbursts. Sleep problems. Loss of appetite. Dizziness. Unusually fast heartbeat. Stomach pains. Headaches. You have new or worsening behavior problems. You are struggling with anxiety, depression, or substance abuse. Get help right away if: You have a severe reaction to a medicine. These symptoms may be an emergency. Get help right away. Call 911. Do not wait to see if the symptoms will go away. Do not drive yourself to the hospital. Take one of these steps if you feel like you may hurt yourself or others, or have thoughts about taking your own life: Go to your nearest emergency room. Call 911. Call the [place] at [phone] or [phone]. This is open 24 hours a day. Text the Crisis Text Line at [phone]. Summary With treatment and support, you can live with ADHD and manage your symptoms. Consider taking part in family therapy or self-help groups with family members or friends. When you talk with friends and family about your ADHD, be patient and communicate openly. Keep all follow-up visits. Your health care provider will need to monitor your condition and adjust your treatment over time. This information is not intended to replace advice given to you by your health care provider. Make sure you discuss any questions you have with your health care provider. Document Revised: 09/23/2021 Document Reviewed: 09/23/2021 Elsevier Patient Education  [place].

## 2022-08-03 ENCOUNTER — Encounter: Payer: Self-pay | Admitting: Psychology

## 2022-08-15 DIAGNOSIS — M5481 Occipital neuralgia: Secondary | ICD-10-CM | POA: Diagnosis not present

## 2022-08-31 ENCOUNTER — Other Ambulatory Visit (HOSPITAL_BASED_OUTPATIENT_CLINIC_OR_DEPARTMENT_OTHER): Payer: Self-pay

## 2022-08-31 ENCOUNTER — Other Ambulatory Visit: Payer: Self-pay | Admitting: Family Medicine

## 2022-08-31 ENCOUNTER — Other Ambulatory Visit: Payer: Self-pay | Admitting: Neurology

## 2022-08-31 ENCOUNTER — Other Ambulatory Visit: Payer: Self-pay

## 2022-08-31 MED ORDER — ALPRAZOLAM 0.25 MG PO TABS
0.2500 mg | ORAL_TABLET | Freq: Every evening | ORAL | 0 refills | Status: DC | PRN
  Filled 2022-08-31: qty 30, 30d supply, fill #0

## 2022-08-31 MED ORDER — AMPHETAMINE-DEXTROAMPHET ER 10 MG PO CP24: 10.0000 mg | ORAL_CAPSULE | Freq: Every day | ORAL | 0 refills | Status: DC

## 2022-09-11 ENCOUNTER — Other Ambulatory Visit (HOSPITAL_BASED_OUTPATIENT_CLINIC_OR_DEPARTMENT_OTHER): Payer: Self-pay

## 2022-09-11 MED ORDER — WEGOVY 1.7 MG/0.75ML ~~LOC~~ SOAJ
1.7000 mg | SUBCUTANEOUS | 0 refills | Status: DC
  Filled 2022-09-11 – 2022-10-01 (×2): qty 3, 28d supply, fill #0

## 2022-09-11 MED ORDER — WEGOVY 1 MG/0.5ML ~~LOC~~ SOAJ
1.0000 mg | SUBCUTANEOUS | 0 refills | Status: DC
  Filled 2022-09-11: qty 2, 28d supply, fill #0

## 2022-09-11 MED ORDER — WEGOVY 2.4 MG/0.75ML ~~LOC~~ SOAJ
2.4000 mg | SUBCUTANEOUS | 0 refills | Status: DC
  Filled 2022-09-11 – 2022-11-01 (×2): qty 3, 28d supply, fill #0

## 2022-09-20 ENCOUNTER — Telehealth: Payer: Self-pay

## 2022-09-20 NOTE — Telephone Encounter (Signed)
PA approved.   PA:[id number];Review Type:Prior Auth;Coverage Start Date:08/21/2022;Coverage End Date:09/20/2023;

## 2022-09-20 NOTE — Telephone Encounter (Signed)
PA initiated via Covermymeds; KEY: [id number]. Awaiting determination.

## 2022-09-22 DIAGNOSIS — Z803 Family history of malignant neoplasm of breast: Secondary | ICD-10-CM | POA: Diagnosis not present

## 2022-09-22 DIAGNOSIS — R92333 Mammographic heterogeneous density, bilateral breasts: Secondary | ICD-10-CM | POA: Diagnosis not present

## 2022-09-22 DIAGNOSIS — Z9189 Other specified personal risk factors, not elsewhere classified: Secondary | ICD-10-CM | POA: Diagnosis not present

## 2022-09-27 ENCOUNTER — Encounter: Payer: Self-pay | Admitting: Internal Medicine

## 2022-09-27 MED ORDER — FLUTICASONE-SALMETEROL 100-50 MCG/ACT IN AEPB: 1.0000 | INHALATION_SPRAY | Freq: Two times a day (BID) | RESPIRATORY_TRACT | 0 refills | Status: DC

## 2022-10-01 ENCOUNTER — Other Ambulatory Visit: Payer: Self-pay | Admitting: Neurology

## 2022-10-02 ENCOUNTER — Other Ambulatory Visit (HOSPITAL_BASED_OUTPATIENT_CLINIC_OR_DEPARTMENT_OTHER): Payer: Self-pay

## 2022-10-02 MED ORDER — AMPHETAMINE-DEXTROAMPHET ER 10 MG PO CP24
10.0000 mg | ORAL_CAPSULE | Freq: Every day | ORAL | 0 refills | Status: DC
  Filled 2022-10-02: qty 30, 30d supply, fill #0

## 2022-10-04 ENCOUNTER — Telehealth: Payer: Self-pay | Admitting: Neurology

## 2022-10-04 ENCOUNTER — Encounter: Payer: Self-pay | Admitting: Neurology

## 2022-10-04 MED ORDER — SUNOSI 150 MG PO TABS: 150.0000 mg | ORAL_TABLET | Freq: Every morning | ORAL | 0 refills | Status: DC

## 2022-10-04 NOTE — Telephone Encounter (Signed)
PA submitted for [facility] PA was approved immediately

## 2022-10-04 NOTE — Telephone Encounter (Signed)
Pt reached out stating there is a hold up with her sunosi prescription with the pharmacy. She has run out of the medication and asking about samples to hold her over. Will provide 2 pk samples of the 150 mg dose.

## 2022-11-01 ENCOUNTER — Other Ambulatory Visit (HOSPITAL_BASED_OUTPATIENT_CLINIC_OR_DEPARTMENT_OTHER): Payer: Self-pay

## 2022-11-01 ENCOUNTER — Other Ambulatory Visit: Payer: Self-pay

## 2022-11-01 ENCOUNTER — Other Ambulatory Visit: Payer: Self-pay | Admitting: Neurology

## 2022-11-02 ENCOUNTER — Other Ambulatory Visit (HOSPITAL_BASED_OUTPATIENT_CLINIC_OR_DEPARTMENT_OTHER): Payer: Self-pay

## 2022-11-02 ENCOUNTER — Other Ambulatory Visit: Payer: Self-pay

## 2022-11-02 MED ORDER — AMPHETAMINE-DEXTROAMPHET ER 10 MG PO CP24
10.0000 mg | ORAL_CAPSULE | Freq: Every day | ORAL | 0 refills | Status: DC
  Filled 2022-11-02: qty 30, 30d supply, fill #0

## 2022-11-02 MED ORDER — ALPRAZOLAM 0.25 MG PO TABS
0.2500 mg | ORAL_TABLET | Freq: Every evening | ORAL | 0 refills | Status: DC | PRN
  Filled 2022-11-02: qty 30, 30d supply, fill #0

## 2022-11-14 ENCOUNTER — Encounter: Payer: Self-pay | Admitting: Neurology

## 2022-11-14 DIAGNOSIS — G4733 Obstructive sleep apnea (adult) (pediatric): Secondary | ICD-10-CM | POA: Diagnosis not present

## 2022-11-15 ENCOUNTER — Other Ambulatory Visit: Payer: Self-pay

## 2022-11-15 MED ORDER — SUNOSI 150 MG PO TABS: 150.0000 mg | ORAL_TABLET | Freq: Every morning | ORAL | 0 refills | Status: DC

## 2022-11-22 ENCOUNTER — Other Ambulatory Visit (HOSPITAL_BASED_OUTPATIENT_CLINIC_OR_DEPARTMENT_OTHER): Payer: Self-pay

## 2022-11-22 ENCOUNTER — Other Ambulatory Visit: Payer: Self-pay

## 2022-11-22 MED ORDER — SUNOSI 150 MG PO TABS: 150.0000 mg | ORAL_TABLET | Freq: Every morning | ORAL | 0 refills | Status: DC

## 2022-11-22 MED ORDER — SUNOSI 150 MG PO TABS: 150.0000 mg | ORAL_TABLET | Freq: Every morning | ORAL | 5 refills | Status: DC

## 2022-11-22 NOTE — Addendum Note (Signed)
Addended by: Naomie Dean B on: 11/22/2022 05:55 PM   Modules accepted: Orders

## 2022-11-24 ENCOUNTER — Other Ambulatory Visit (HOSPITAL_BASED_OUTPATIENT_CLINIC_OR_DEPARTMENT_OTHER): Payer: Self-pay

## 2022-11-24 MED ORDER — WEGOVY 2.4 MG/0.75ML ~~LOC~~ SOAJ
2.4000 mg | SUBCUTANEOUS | 0 refills | Status: DC
  Filled 2022-11-24: qty 3, 28d supply, fill #0

## 2022-12-05 ENCOUNTER — Other Ambulatory Visit (HOSPITAL_BASED_OUTPATIENT_CLINIC_OR_DEPARTMENT_OTHER): Payer: Self-pay

## 2022-12-05 ENCOUNTER — Other Ambulatory Visit: Payer: Self-pay | Admitting: Neurology

## 2022-12-06 ENCOUNTER — Other Ambulatory Visit (HOSPITAL_BASED_OUTPATIENT_CLINIC_OR_DEPARTMENT_OTHER): Payer: Self-pay

## 2022-12-06 MED ORDER — AMPHETAMINE-DEXTROAMPHET ER 10 MG PO CP24
10.0000 mg | ORAL_CAPSULE | Freq: Every day | ORAL | 0 refills | Status: DC
  Filled 2022-12-06: qty 30, 30d supply, fill #0

## 2022-12-12 ENCOUNTER — Other Ambulatory Visit (HOSPITAL_BASED_OUTPATIENT_CLINIC_OR_DEPARTMENT_OTHER): Payer: Self-pay

## 2022-12-12 DIAGNOSIS — E663 Overweight: Secondary | ICD-10-CM | POA: Diagnosis not present

## 2022-12-12 DIAGNOSIS — Z6823 Body mass index (BMI) 23.0-23.9, adult: Secondary | ICD-10-CM | POA: Diagnosis not present

## 2022-12-12 MED ORDER — WEGOVY 1.7 MG/0.75ML ~~LOC~~ SOAJ
1.7000 mg | SUBCUTANEOUS | 5 refills | Status: DC
  Filled 2022-12-12: qty 3, 28d supply, fill #0
  Filled 2023-01-12: qty 3, 28d supply, fill #1
  Filled 2023-02-12: qty 3, 28d supply, fill #2
  Filled 2023-03-12: qty 3, 28d supply, fill #3
  Filled 2023-04-11: qty 3, 28d supply, fill #4
  Filled 2023-05-09: qty 3, 28d supply, fill #5
  Filled 2023-06-07: qty 3, 28d supply, fill #6

## 2022-12-24 ENCOUNTER — Other Ambulatory Visit: Payer: Self-pay | Admitting: Neurology

## 2022-12-26 NOTE — Telephone Encounter (Signed)
Requested Prescriptions   Pending Prescriptions Disp Refills   SUNOSI 150 MG TABS [Pharmacy Med Name: SUNOSI 150MG  TABLETS] 30 tablet     Sig: TAKE 1 TABLET(150 MG) BY MOUTH IN THE MORNING   Last seen 08/02/22, next appt scheduled for 02/06/23 Dispenses  Routing to provider to fill Dispensed Days Supply Quantity Provider Pharmacy  SUNOSI 150MG  TABLETS 11/24/2022 30 30 each Anson Fret, MD [facility] #...  SUNOSI 150MG  TABLETS 10/13/2022 30 30 each Dohmeier, Porfirio Mylar, MD [facility] #...  SUNOSI 150MG  TABLETS 08/30/2022 30 30 each Dohmeier, Porfirio Mylar, MD [facility] #...  SUNOSI 150MG  TABLETS 07/31/2022 30 30 each Dohmeier, Porfirio Mylar, MD [facility] #...  SUNOSI 150MG  TABLETS 06/26/2022 30 30 each Dohmeier, Porfirio Mylar, MD [facility] STORE #...  SUNOSI 150MG  TABLETS 05/23/2022 30 30 each Dohmeier, Porfirio Mylar, MD [facility]ORE #...  SUNOSI 150MG  TABLETS 04/20/2022 30 30 each Dohmeier, Porfirio Mylar, MD [facility] STORE #...  SUNOSI 150MG  TABLETS 03/20/2022 30 30 each Dohmeier, Porfirio Mylar, MD [facility] DR[facility] STORE #...  SUNOSI 150MG  TABLETS 02/16/2022 30 30 each Dohmeier, Porfirio Mylar, MD [facility] DR[facility] STORE #...  SUNOSI 150MG  TABLETS 01/12/2022 30 30 each Dohmeier, Porfirio Mylar, MD [facility] DR[facility] STORE #.Marland KitchenMarland Kitchen

## 2022-12-28 DIAGNOSIS — R92333 Mammographic heterogeneous density, bilateral breasts: Secondary | ICD-10-CM | POA: Diagnosis not present

## 2022-12-28 DIAGNOSIS — Z1389 Encounter for screening for other disorder: Secondary | ICD-10-CM | POA: Diagnosis not present

## 2022-12-28 DIAGNOSIS — N6012 Diffuse cystic mastopathy of left breast: Secondary | ICD-10-CM | POA: Diagnosis not present

## 2022-12-28 DIAGNOSIS — Z9189 Other specified personal risk factors, not elsewhere classified: Secondary | ICD-10-CM | POA: Diagnosis not present

## 2022-12-28 DIAGNOSIS — N6011 Diffuse cystic mastopathy of right breast: Secondary | ICD-10-CM | POA: Diagnosis not present

## 2022-12-28 DIAGNOSIS — Z803 Family history of malignant neoplasm of breast: Secondary | ICD-10-CM | POA: Diagnosis not present

## 2023-01-05 ENCOUNTER — Other Ambulatory Visit: Payer: Self-pay | Admitting: Neurology

## 2023-01-05 ENCOUNTER — Other Ambulatory Visit (HOSPITAL_BASED_OUTPATIENT_CLINIC_OR_DEPARTMENT_OTHER): Payer: Self-pay

## 2023-01-08 ENCOUNTER — Other Ambulatory Visit (HOSPITAL_BASED_OUTPATIENT_CLINIC_OR_DEPARTMENT_OTHER): Payer: Self-pay

## 2023-01-08 MED ORDER — AMPHETAMINE-DEXTROAMPHET ER 10 MG PO CP24
10.0000 mg | ORAL_CAPSULE | Freq: Every day | ORAL | 0 refills | Status: DC
  Filled 2023-01-08: qty 30, 30d supply, fill #0

## 2023-01-08 NOTE — Telephone Encounter (Signed)
Requested Prescriptions   Pending Prescriptions Disp Refills   amphetamine-dextroamphetamine (ADDERALL XR) 10 MG 24 hr capsule 30 capsule 0    Sig: Take 1 capsule (10 mg total) by mouth daily.   Last seen 08/02/22, next appt scheduled for 02/06/23   Dispenses  Routing to provider to fill Dispensed Days Supply Quantity Provider Pharmacy  amphetamine-dextroamphetamine (ADDERALL XR) 10 MG 24 hr capsule 12/06/2022 30 30 capsule Dohmeier, Porfirio Mylar, MD [facility] -...  amphetamine-dextroamphetamine (ADDERALL XR) 10 MG 24 hr capsule 11/03/2022 30 30 capsule Dohmeier, Porfirio Mylar, MD [facility] -...  amphetamine-dextroamphetamine (ADDERALL XR) 10 MG 24 hr capsule 10/03/2022 30 30 capsule Dohmeier, Porfirio Mylar, MD [facility] -...  D-AMPHETAMINE ER 10MG  SALT COMBO CP 08/31/2022 30 30 each Dohmeier, Porfirio Mylar, MD [facility] #...  amphetamine-dextroamphetamine (ADDERALL XR) 10 MG 24 hr capsule 08/02/2022 30 30 capsule Dohmeier, Porfirio Mylar, MD [facility] -.Marland KitchenMarland Kitchen

## 2023-02-05 ENCOUNTER — Encounter: Payer: Self-pay | Admitting: Neurology

## 2023-02-06 ENCOUNTER — Encounter: Payer: Self-pay | Admitting: Neurology

## 2023-02-06 ENCOUNTER — Ambulatory Visit (INDEPENDENT_AMBULATORY_CARE_PROVIDER_SITE_OTHER): Payer: BC Managed Care – PPO | Admitting: Neurology

## 2023-02-06 VITALS — BP 108/78 | HR 101 | Ht 66.0 in | Wt 139.2 lb

## 2023-02-06 DIAGNOSIS — H814 Vertigo of central origin: Secondary | ICD-10-CM

## 2023-02-06 DIAGNOSIS — G4710 Hypersomnia, unspecified: Secondary | ICD-10-CM | POA: Diagnosis not present

## 2023-02-06 DIAGNOSIS — G4733 Obstructive sleep apnea (adult) (pediatric): Secondary | ICD-10-CM | POA: Diagnosis not present

## 2023-02-06 DIAGNOSIS — G47411 Narcolepsy with cataplexy: Secondary | ICD-10-CM | POA: Diagnosis not present

## 2023-02-06 DIAGNOSIS — F988 Other specified behavioral and emotional disorders with onset usually occurring in childhood and adolescence: Secondary | ICD-10-CM
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
## 2023-02-06 NOTE — Addendum Note (Signed)
Addended by: Melvyn Novas on: 02/06/2023 12:51 PM   Modules accepted: Orders
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
## 2023-02-08 ENCOUNTER — Other Ambulatory Visit (HOSPITAL_BASED_OUTPATIENT_CLINIC_OR_DEPARTMENT_OTHER): Payer: Self-pay

## 2023-02-08 ENCOUNTER — Other Ambulatory Visit: Payer: Self-pay | Admitting: Neurology

## 2023-02-09 ENCOUNTER — Other Ambulatory Visit (HOSPITAL_BASED_OUTPATIENT_CLINIC_OR_DEPARTMENT_OTHER): Payer: Self-pay

## 2023-02-12 ENCOUNTER — Telehealth: Payer: Self-pay | Admitting: Neurology

## 2023-02-12 NOTE — Telephone Encounter (Signed)
NPSG/MSLT [employer]BS no Berkley Harvey req spoke to Lavon Paganini ref # [id number]   Sent mychart message.

## 2023-02-13 ENCOUNTER — Other Ambulatory Visit (HOSPITAL_BASED_OUTPATIENT_CLINIC_OR_DEPARTMENT_OTHER): Payer: Self-pay

## 2023-02-13 MED ORDER — AMPHETAMINE-DEXTROAMPHET ER 10 MG PO CP24
10.0000 mg | ORAL_CAPSULE | Freq: Every day | ORAL | 0 refills | Status: DC
  Filled 2023-02-13: qty 30, 30d supply, fill #0

## 2023-02-28 ENCOUNTER — Other Ambulatory Visit: Payer: Self-pay | Admitting: Neurology

## 2023-03-01 ENCOUNTER — Other Ambulatory Visit (HOSPITAL_BASED_OUTPATIENT_CLINIC_OR_DEPARTMENT_OTHER): Payer: Self-pay

## 2023-03-01 MED ORDER — ALPRAZOLAM 0.25 MG PO TABS
0.2500 mg | ORAL_TABLET | Freq: Every evening | ORAL | 0 refills | Status: AC | PRN
  Filled 2023-03-01: qty 30, 30d supply, fill #0

## 2023-03-02 ENCOUNTER — Other Ambulatory Visit: Payer: Self-pay

## 2023-03-14 ENCOUNTER — Other Ambulatory Visit: Payer: Self-pay | Admitting: Neurology

## 2023-03-14 ENCOUNTER — Other Ambulatory Visit (HOSPITAL_BASED_OUTPATIENT_CLINIC_OR_DEPARTMENT_OTHER): Payer: Self-pay

## 2023-03-14 MED ORDER — AMPHETAMINE-DEXTROAMPHET ER 10 MG PO CP24
10.0000 mg | ORAL_CAPSULE | Freq: Every day | ORAL | 0 refills | Status: DC
  Filled 2023-03-14: qty 30, 30d supply, fill #0

## 2023-03-15 ENCOUNTER — Other Ambulatory Visit (HOSPITAL_BASED_OUTPATIENT_CLINIC_OR_DEPARTMENT_OTHER): Payer: Self-pay

## 2023-03-27 DIAGNOSIS — Z9189 Other specified personal risk factors, not elsewhere classified: Secondary | ICD-10-CM | POA: Diagnosis not present

## 2023-03-27 DIAGNOSIS — R928 Other abnormal and inconclusive findings on diagnostic imaging of breast: Secondary | ICD-10-CM | POA: Diagnosis not present

## 2023-03-27 DIAGNOSIS — Z803 Family history of malignant neoplasm of breast: Secondary | ICD-10-CM | POA: Diagnosis not present

## 2023-03-27 DIAGNOSIS — R92333 Mammographic heterogeneous density, bilateral breasts: Secondary | ICD-10-CM | POA: Diagnosis not present

## 2023-04-04 ENCOUNTER — Other Ambulatory Visit: Payer: Self-pay | Admitting: Internal Medicine

## 2023-04-04 MED ORDER — FLUTICASONE-SALMETEROL 100-50 MCG/ACT IN AEPB: 1.0000 | INHALATION_SPRAY | Freq: Two times a day (BID) | RESPIRATORY_TRACT | 3 refills | Status: DC

## 2023-04-11 ENCOUNTER — Other Ambulatory Visit (HOSPITAL_BASED_OUTPATIENT_CLINIC_OR_DEPARTMENT_OTHER): Payer: Self-pay

## 2023-04-11 ENCOUNTER — Other Ambulatory Visit: Payer: Self-pay | Admitting: Neurology

## 2023-04-12 ENCOUNTER — Other Ambulatory Visit: Payer: Self-pay

## 2023-04-13 ENCOUNTER — Other Ambulatory Visit (HOSPITAL_BASED_OUTPATIENT_CLINIC_OR_DEPARTMENT_OTHER): Payer: Self-pay

## 2023-04-13 MED ORDER — AMPHETAMINE-DEXTROAMPHET ER 10 MG PO CP24: 10.0000 mg | ORAL_CAPSULE | Freq: Every day | ORAL | 0 refills | Status: DC

## 2023-04-13 NOTE — Telephone Encounter (Signed)
E-scribe system down. Dr. Vickey Huger printed rx and signed. I faxed to pharmacy at [phone]. Received fax confirmation.

## 2023-04-16 ENCOUNTER — Other Ambulatory Visit (HOSPITAL_BASED_OUTPATIENT_CLINIC_OR_DEPARTMENT_OTHER): Payer: Self-pay

## 2023-04-16 ENCOUNTER — Other Ambulatory Visit: Payer: Self-pay | Admitting: Neurology

## 2023-04-16 MED ORDER — AMPHETAMINE-DEXTROAMPHET ER 10 MG PO CP24
10.0000 mg | ORAL_CAPSULE | Freq: Every day | ORAL | 0 refills | Status: DC
  Filled 2023-04-16: qty 30, 30d supply, fill #0

## 2023-04-16 MED ORDER — AMPHETAMINE-DEXTROAMPHET ER 10 MG PO CP24: 10.0000 mg | ORAL_CAPSULE | Freq: Every day | ORAL | 0 refills | Status: DC

## 2023-04-16 NOTE — Addendum Note (Signed)
Addended by: Melvyn Novas on: 04/16/2023 05:41 PM   Modules accepted: Orders

## 2023-04-16 NOTE — Addendum Note (Signed)
Addended by: Judi Cong on: 04/16/2023 02:37 PM   Modules accepted: Orders

## 2023-04-16 NOTE — Telephone Encounter (Signed)
[facility] [place]  has called to report that the Rx for amphetamine-dextroamphetamine (ADDERALL XR) 10 MG 24 hr capsule   needs to be e- scribed to them

## 2023-04-16 NOTE — Telephone Encounter (Signed)
Dr Merlyn Bollen re: Brandi Hall. [place] Female, 32 y.o., May 30, 1991 [phone] MRN: [id number] [facility] [place] is asking if you can escribe the Rx for the amphetamine-dextroamphetamine (ADDERALL XR) 10 MG 24 hr capsule again

## 2023-04-17 ENCOUNTER — Other Ambulatory Visit (HOSPITAL_BASED_OUTPATIENT_CLINIC_OR_DEPARTMENT_OTHER): Payer: Self-pay

## 2023-04-24 ENCOUNTER — Encounter: Payer: Self-pay | Admitting: Neurology

## 2023-04-30 ENCOUNTER — Encounter: Payer: BC Managed Care – PPO | Attending: Psychology | Admitting: Psychology

## 2023-04-30 DIAGNOSIS — R41840 Attention and concentration deficit: Secondary | ICD-10-CM | POA: Diagnosis not present

## 2023-04-30 DIAGNOSIS — G4710 Hypersomnia, unspecified: Secondary | ICD-10-CM | POA: Diagnosis not present

## 2023-04-30 DIAGNOSIS — G47411 Narcolepsy with cataplexy: Secondary | ICD-10-CM

## 2023-04-30 DIAGNOSIS — G43809 Other migraine, not intractable, without status migrainosus: Secondary | ICD-10-CM

## 2023-05-02 ENCOUNTER — Encounter (HOSPITAL_BASED_OUTPATIENT_CLINIC_OR_DEPARTMENT_OTHER): Payer: BC Managed Care – PPO

## 2023-05-02 DIAGNOSIS — R41840 Attention and concentration deficit: Secondary | ICD-10-CM

## 2023-05-02 NOTE — Progress Notes (Signed)
   Behavioral Observations:  The patient appeared well-groomed and appropriately dressed. Her manners were polite and appropriate to the situation. The patient's attitude towards testing was positive and her effort was good.   Neuropsychology Note  Brandi Hall completed 80 minutes of neuropsychological testing with technician, Staci Acosta, BA, under the supervision of Arley Phenix, PsyD., Clinical Neuropsychologist. The patient did not appear overtly distressed by the testing session, per behavioral observation or via self-report to the technician. Rest breaks were offered.   Clinical Decision Making: In considering the patient's current level of functioning, level of presumed impairment, nature of symptoms, emotional and behavioral responses during clinical interview, level of literacy, and observed level of motivation/effort, a battery of tests was selected by Dr. Kieth Brightly during initial consultation on 04/30/2023. This was communicated to the technician. Communication between the neuropsychologist and technician was ongoing throughout the testing session and changes were made as deemed necessary based on patient performance on testing, technician observations and additional pertinent factors such as those listed above.  Tests Administered: Comprehensive Attention Battery (CAB) Continuous Performance Test (CPT)   Results: Will be included in final report   Feedback to Patient: Brandi Hall will return on 07/19/2023 for an interactive feedback session with Dr. Kieth Brightly at which time her test performances, clinical impressions and treatment recommendations will be reviewed in detail. The patient understands she can contact our office should she require our assistance before this time.  80 minutes spent face-to-face with patient administering standardized tests, 30 minutes spent scoring Radiographer, therapeutic). [CPT [id number], [id number]]  Full report to follow.

## 2023-05-15 ENCOUNTER — Other Ambulatory Visit: Payer: Self-pay | Admitting: Neurology

## 2023-05-15 ENCOUNTER — Other Ambulatory Visit (HOSPITAL_BASED_OUTPATIENT_CLINIC_OR_DEPARTMENT_OTHER): Payer: Self-pay

## 2023-05-15 MED ORDER — AMPHETAMINE-DEXTROAMPHET ER 10 MG PO CP24
10.0000 mg | ORAL_CAPSULE | Freq: Every day | ORAL | 0 refills | Status: DC
  Filled 2023-05-15: qty 30, 30d supply, fill #0

## 2023-06-06 ENCOUNTER — Encounter: Payer: BC Managed Care – PPO | Attending: Psychology | Admitting: Psychology

## 2023-06-06 ENCOUNTER — Encounter: Payer: Self-pay | Admitting: Psychology

## 2023-06-06 DIAGNOSIS — F418 Other specified anxiety disorders: Secondary | ICD-10-CM

## 2023-06-06 DIAGNOSIS — G47411 Narcolepsy with cataplexy: Secondary | ICD-10-CM

## 2023-06-06 DIAGNOSIS — R41840 Attention and concentration deficit: Secondary | ICD-10-CM | POA: Diagnosis not present
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
## 2023-06-07 ENCOUNTER — Encounter (HOSPITAL_BASED_OUTPATIENT_CLINIC_OR_DEPARTMENT_OTHER): Payer: BC Managed Care – PPO | Admitting: Psychology

## 2023-06-07 DIAGNOSIS — G4733 Obstructive sleep apnea (adult) (pediatric): Secondary | ICD-10-CM

## 2023-06-07 DIAGNOSIS — F418 Other specified anxiety disorders: Secondary | ICD-10-CM | POA: Diagnosis not present

## 2023-06-07 DIAGNOSIS — G47411 Narcolepsy with cataplexy: Secondary | ICD-10-CM | POA: Diagnosis not present

## 2023-06-07 DIAGNOSIS — R41840 Attention and concentration deficit: Secondary | ICD-10-CM

## 2023-06-15 ENCOUNTER — Other Ambulatory Visit (HOSPITAL_BASED_OUTPATIENT_CLINIC_OR_DEPARTMENT_OTHER): Payer: Self-pay

## 2023-06-15 ENCOUNTER — Other Ambulatory Visit: Payer: Self-pay | Admitting: Neurology

## 2023-06-18 ENCOUNTER — Other Ambulatory Visit (HOSPITAL_BASED_OUTPATIENT_CLINIC_OR_DEPARTMENT_OTHER): Payer: Self-pay

## 2023-06-18 MED ORDER — AMPHETAMINE-DEXTROAMPHET ER 10 MG PO CP24
10.0000 mg | ORAL_CAPSULE | Freq: Every day | ORAL | 0 refills | Status: DC
  Filled 2023-06-18: qty 30, 30d supply, fill #0

## 2023-06-18 NOTE — Telephone Encounter (Signed)
Requested Prescriptions   Pending Prescriptions Disp Refills   amphetamine-dextroamphetamine (ADDERALL XR) 10 MG 24 hr capsule 30 capsule 0    Sig: Take 1 capsule (10 mg total) by mouth daily.   Last seen 02/06/23 Next appt 09/03/23  Dispenses   Dispensed Days Supply Quantity Provider Pharmacy  amphetamine-dextroamphetamine (ADDERALL XR) 10 MG 24 hr capsule 05/16/2023 30 30 capsule Windell Norfolk, MD [facility] -...  amphetamine-dextroamphetamine (ADDERALL XR) 10 MG 24 hr capsule 04/17/2023 30 30 capsule Dohmeier, Porfirio Mylar, MD [facility] -...  amphetamine-dextroamphetamine (ADDERALL XR) 10 MG 24 hr capsule 03/16/2023 30 30 capsule Dohmeier, Porfirio Mylar, MD [facility] -...  amphetamine-dextroamphetamine (ADDERALL XR) 10 MG 24 hr capsule 02/13/2023 30 30 capsule Dohmeier, Porfirio Mylar, MD [facility] -...  amphetamine-dextroamphetamine (ADDERALL XR) 10 MG 24 hr capsule 01/09/2023 30 30 capsule Dohmeier, Porfirio Mylar, MD [facility] -...  amphetamine-dextroamphetamine (ADDERALL XR) 10 MG 24 hr capsule 12/06/2022 30 30 capsule Dohmeier, Porfirio Mylar, MD [facility] -...  amphetamine-dextroamphetamine (ADDERALL XR) 10 MG 24 hr capsule 11/03/2022 30 30 capsule Dohmeier, Porfirio Mylar, MD [facility] -...  amphetamine-dextroamphetamine (ADDERALL XR) 10 MG 24 hr capsule 10/03/2022 30 30 capsule Dohmeier, Porfirio Mylar, MD [facility] -...  D-AMPHETAMINE ER 10MG  SALT COMBO CP 08/31/2022 30 30 each Dohmeier, Porfirio Mylar, MD [facility] #...  amphetamine-dextroamphetamine (ADDERALL XR) 10 MG 24 hr capsule 08/02/2022 30 30 capsule Dohmeier, Porfirio Mylar, MD [facility] -.Marland KitchenMarland Kitchen

## 2023-06-21 ENCOUNTER — Ambulatory Visit: Payer: BC Managed Care – PPO | Admitting: Psychology

## 2023-06-25 NOTE — Telephone Encounter (Signed)
 NPSG BCBS no Berkley Harvey req spoke to [place] ref # [id number]  MSLT I spoke with Ander Slade she stated an Berkley Harvey may be require. Started a case fax clinical notes.

## 2023-06-27 ENCOUNTER — Other Ambulatory Visit (HOSPITAL_BASED_OUTPATIENT_CLINIC_OR_DEPARTMENT_OTHER): Payer: Self-pay

## 2023-06-27 DIAGNOSIS — Z1159 Encounter for screening for other viral diseases: Secondary | ICD-10-CM | POA: Diagnosis not present

## 2023-06-27 DIAGNOSIS — Z114 Encounter for screening for human immunodeficiency virus [HIV]: Secondary | ICD-10-CM | POA: Diagnosis not present

## 2023-06-27 DIAGNOSIS — Z01419 Encounter for gynecological examination (general) (routine) without abnormal findings: Secondary | ICD-10-CM | POA: Diagnosis not present

## 2023-06-27 DIAGNOSIS — Z118 Encounter for screening for other infectious and parasitic diseases: Secondary | ICD-10-CM | POA: Diagnosis not present

## 2023-06-27 DIAGNOSIS — Z113 Encounter for screening for infections with a predominantly sexual mode of transmission: Secondary | ICD-10-CM | POA: Diagnosis not present

## 2023-06-27 DIAGNOSIS — F419 Anxiety disorder, unspecified: Secondary | ICD-10-CM | POA: Diagnosis not present

## 2023-06-27 MED ORDER — WEGOVY 1.7 MG/0.75ML ~~LOC~~ SOAJ
1.7000 mg | SUBCUTANEOUS | 5 refills | Status: DC
Start: 2023-06-27 — End: 2024-03-10
  Filled 2023-06-27 – 2023-07-06 (×2): qty 3, 28d supply, fill #0
  Filled 2023-08-02: qty 3, 28d supply, fill #1
  Filled 2023-08-30: qty 3, 28d supply, fill #2
  Filled 2023-10-01: qty 3, 28d supply, fill #3
  Filled 2023-10-30: qty 3, 28d supply, fill #4
  Filled 2023-11-26: qty 3, 28d supply, fill #5

## 2023-06-27 MED ORDER — ESCITALOPRAM OXALATE 10 MG PO TABS
10.0000 mg | ORAL_TABLET | Freq: Every day | ORAL | 2 refills | Status: DC
  Filled 2023-06-27: qty 30, 30d supply, fill #0
  Filled 2023-08-02: qty 30, 30d supply, fill #1

## 2023-06-28 ENCOUNTER — Other Ambulatory Visit (HOSPITAL_BASED_OUTPATIENT_CLINIC_OR_DEPARTMENT_OTHER): Payer: Self-pay

## 2023-07-02 ENCOUNTER — Encounter: Payer: Self-pay | Admitting: Psychology

## 2023-07-02 NOTE — Telephone Encounter (Signed)
 I called BCBS anthem spoke with Glo Herring B she stated it is still pending and we should know an answer before or on 07/10/2023.

## 2023-07-06 ENCOUNTER — Other Ambulatory Visit (HOSPITAL_BASED_OUTPATIENT_CLINIC_OR_DEPARTMENT_OTHER): Payer: Self-pay

## 2023-07-06 DIAGNOSIS — A749 Chlamydial infection, unspecified: Secondary | ICD-10-CM | POA: Diagnosis not present

## 2023-07-09 ENCOUNTER — Encounter: Payer: BC Managed Care – PPO | Admitting: Internal Medicine

## 2023-07-10 NOTE — Telephone Encounter (Signed)
NPSG/MSLT BCBS no auth req spoke to [place] T ref # [id number]

## 2023-07-11 ENCOUNTER — Telehealth: Payer: Self-pay

## 2023-07-11 NOTE — Telephone Encounter (Signed)
I have lvm to reschedule patients NPSG and MSLT to the next weekend due to schedule conflicts. mla

## 2023-07-12 NOTE — Progress Notes (Signed)
Complete physical exam   Patient: Brandi Hall   DOB: 05-05-1991   33 y.o. Female  MRN: [id number] Visit Date: 07/13/2023  Today's healthcare provider: Alfredia Ferguson, PA-C   Chief Complaint  Patient presents with   Annual Exam   Subjective    Brandi Hall is a 33 y.o. female who presents today for a complete physical exam.  She has no acute concerns today. She has an upcoming appt for repeat sleep apnea testing and r/o narcolepsy given ongoing symptoms and family history.  She has an allergy to soy, and takes zyrtec daily. She has asthma, takes wixela daily w/ prn albuterol      Past Medical History:  Diagnosis Date   Allergic rhinitis    Anxiety    Asthma    Bronchitis    Lymph node abscess    hosp for infected lymph node (Salmonella) at 33 y/o   PCOS (polycystic ovarian syndrome)    Pneumonia    Post partum depression    Post term pregnancy, 41 weeks 12/18/2014   Postpartum care following vaginal delivery (7/1) 12/18/2014   Sleep apnea    CPAP @ HS   Past Surgical History:  Procedure Laterality Date   LAD bx     MASS EXCISION Right 02/22/2021   Procedure: EXCISION RIGHT NASAL POLYPOID MASS;  Surgeon: Drema Halon, MD;  Location: [facility] OR;  Service: ENT;  Laterality: Right;   NASAL SEPTOPLASTY W/ TURBINOPLASTY N/A 02/22/2021   Procedure: NASAL SEPTOPLASTY WITH TURBINATE REDUCTION;  Surgeon: Drema Halon, MD;  Location: [facility] OR;  Service: ENT;  Laterality: N/A;   WISDOM TOOTH EXTRACTION     Social History   Socioeconomic History   Marital status: Married    Spouse name: marc   Number of children: 2   Years of education: Not on file   Highest education level: Bachelor's degree (e.g., BA, AB, BS)  Occupational History   Occupation: finished college, works-- Tourist information centre manager  Tobacco Use   Smoking status: Never   Smokeless tobacco: Never  Vaping Use   Vaping status: Never Used  Substance and Sexual Activity   Alcohol use: Yes     Alcohol/week: 0.0 standard drinks of alcohol    Comment: rarely   Drug use: No   Sexual activity: Yes  Other Topics Concern   Not on file  Social History Narrative   Live with husband 2 daughter - 2016 , 2020   Right handed   Caffeine: 3 cups of coffee a day and 1 tea or soda a day   Social Drivers of Corporate investment banker Strain: Low Risk  (09/22/2022)   Received from [facility], [facility]   Overall Financial Resource Strain (CARDIA)    Difficulty of Paying Living Expenses: Not very hard  Food Insecurity: No Food Insecurity (09/22/2022)   Received from [facility], [facility]   Hunger Vital Sign    Worried About Running Out of Food in the Last Year: Never true    Ran Out of Food in the Last Year: Never true  Transportation Needs: No Transportation Needs (09/22/2022)   Received from [facility], [facility]   PRAPARE - Transportation    Lack of Transportation (Medical): No    Lack of Transportation (Non-Medical): No  Physical Activity: Sufficiently Active (09/22/2022)   Received from [facility], [facility]   Exercise Vital Sign    Days of Exercise per Week: 6 days    Minutes of  Exercise per Session: 60 min  Stress: No Stress Concern Present (09/22/2022)   Received from [facility], [facility] - Occupational Stress Questionnaire    Feeling of Stress : Only a little  Social Connections: Moderately Integrated (09/22/2022)   Received from [facility], [facility]   Social Network    How would you rate your social network (family, work, friends)?: Adequate participation with social networks  Intimate Partner Violence: Not At Risk (09/22/2022)   Received from [facility], [facility]   HITS    Over the last 12 months how often did your partner physically hurt you?: Never    Over the last 12 months how often did your partner insult you or talk down to you?: Rarely    Over the last 12 months how often did your  partner threaten you with physical harm?: Never    Over the last 12 months how often did your partner scream or curse at you?: Never   Family Status  Relation Name Status   Mother  Alive   Father  Alive   Mat Aunt  (Not Specified)   Other GGF (Not Specified)   Other  (Not Specified)   Other Grandfather (Not Specified)   Other Grandfather (Not Specified)  No partnership data on file   Family History  Problem Relation Age of Onset   Breast cancer Mother        dx age late 80s   Asthma Father    Uterine cancer Maternal Aunt    Colon cancer Other        Colon   Diabetes Other        GGF   Coronary artery disease Other    Congestive Heart Failure Other    Allergies  Allergen Reactions   Molds & Smuts Itching and Shortness Of Breath    Other Reaction(s): Not available   Cat Dander    Dog Epithelium (Canis Lupus Familiaris)     Other Reaction(s): Not available   Soy Allergy (Do Not Select) Nausea And Vomiting   Milk Protein Diarrhea    Other Reaction(s): Abdominal Pain   Wound Dressing Adhesive Rash    Glue on Bandaids    Patient Care Team: Alfredia Ferguson, PA-C as PCP - General (Physician Assistant) Dohmeier, Porfirio Mylar, MD as Consulting Physician (Neurology)   Medications: Outpatient Medications Prior to Visit  Medication Sig   acetaminophen (TYLENOL) 500 MG tablet Take 1,000 mg by mouth every 6 (six) hours as needed for mild pain.   ALPRAZolam (XANAX) 0.25 MG tablet Take 1 tablet (0.25 mg total) by mouth at bedtime as needed for anxiety.   amphetamine-dextroamphetamine (ADDERALL XR) 10 MG 24 hr capsule Take 1 capsule (10 mg total) by mouth daily.   dicyclomine (BENTYL) 10 MG capsule Take 1 capsule (10 mg total) by mouth 3 (three) times daily as needed for spasms.   escitalopram (LEXAPRO) 10 MG tablet Take 1 tablet (10 mg total) by mouth daily.   loratadine (CLARITIN) 10 MG tablet Take 10 mg by mouth daily as needed for allergies.   omeprazole (PRILOSEC) 40 MG capsule Take  1 capsule (40 mg total) by mouth 2 (two) times daily before a meal. (Patient taking differently: Take 40 mg by mouth daily.)   OVER THE COUNTER MEDICATION Take 4 capsules by mouth daily. Myo-inositol   Semaglutide-Weight Management (WEGOVY) 1.7 MG/0.75ML SOAJ Inject 1.7 mg into the skin once a week.   Solriamfetol HCl (SUNOSI) 150  MG TABS TAKE 1 TABLET(150 MG) BY MOUTH IN THE MORNING   Tetrahydrozoline-Zn Sulfate (EYE DROPS ALLERGY RELIEF OP) Place 1 drop into both eyes daily as needed (allergy).   triamcinolone (NASACORT) 55 MCG/ACT AERO nasal inhaler Place 2 sprays into the nose daily. 2 sprays each nostril at night.   [DISCONTINUED] albuterol (VENTOLIN HFA) 108 (90 Base) MCG/ACT inhaler Inhale 2 puffs into the lungs every 6 (six) hours as needed for wheezing or shortness of breath.   [DISCONTINUED] fluticasone-salmeterol (ADVAIR) 100-50 MCG/ACT AEPB Inhale 1 puff into the lungs 2 (two) times daily.   [DISCONTINUED] Semaglutide-Weight Management (WEGOVY) 1.7 MG/0.75ML SOAJ Inject 1.7 mg into the skin once a week.   Facility-Administered Medications Prior to Visit  Medication Dose Route Frequency Provider   gadobenate dimeglumine (MULTIHANCE) injection 15 mL  15 mL Intravenous Once PRN Dohmeier, Porfirio Mylar, MD    Review of Systems  Constitutional:  Negative for fatigue and fever.  Respiratory:  Negative for cough and shortness of breath.   Cardiovascular:  Negative for chest pain and leg swelling.  Gastrointestinal:  Negative for abdominal pain.  Neurological:  Negative for dizziness and headaches.      Objective    BP 110/78 (BP Location: Left Arm, Patient Position: Sitting, Cuff Size: Normal)   Pulse 90   Temp 98 F (36.7 C) (Oral)   Resp 18   Ht 5\' 5"  (1.651 m)   Wt 134 lb 9.6 oz (61.1 kg)   SpO2 97%   BMI 22.40 kg/m    Physical Exam Constitutional:      General: She is awake.     Appearance: She is well-developed. She is not ill-appearing.  HENT:     Head: Normocephalic.      Right Ear: Tympanic membrane normal.     Left Ear: Tympanic membrane normal.     Nose: Nose normal. No congestion or rhinorrhea.     Mouth/Throat:     Pharynx: No oropharyngeal exudate or posterior oropharyngeal erythema.  Eyes:     Conjunctiva/sclera: Conjunctivae normal.     Pupils: Pupils are equal, round, and reactive to light.  Neck:     Thyroid: No thyroid mass or thyromegaly.  Cardiovascular:     Rate and Rhythm: Normal rate and regular rhythm.     Heart sounds: Normal heart sounds.  Pulmonary:     Effort: Pulmonary effort is normal.     Breath sounds: Normal breath sounds.  Abdominal:     Palpations: Abdomen is soft.     Tenderness: There is no abdominal tenderness.  Musculoskeletal:     Right lower leg: No swelling. No edema.     Left lower leg: No swelling. No edema.  Lymphadenopathy:     Cervical: No cervical adenopathy.  Skin:    General: Skin is warm.  Neurological:     Mental Status: She is alert and oriented to person, place, and time.  Psychiatric:        Attention and Perception: Attention normal.        Mood and Affect: Mood normal.        Speech: Speech normal.        Behavior: Behavior normal. Behavior is cooperative.     Last depression screening scores    07/07/2022    3:36 PM 04/26/2022   11:25 AM 05/10/2021    2:33 PM  PHQ 2/9 Scores  PHQ - 2 Score 2 0 0  PHQ- 9 Score 11     Last fall risk  screening    07/07/2022    3:02 PM  Fall Risk   Falls in the past year? 0  Number falls in past yr: 0  Injury with Fall? 0  Follow up Falls evaluation completed   Last Audit-C alcohol use screening     No data to display         A score of 3 or more in women, and 4 or more in men indicates increased risk for alcohol abuse, EXCEPT if all of the points are from question 1   No results found for any visits on 07/13/23.  Assessment & Plan    Routine Health Maintenance and Physical Exam  Exercise Activities and Dietary  recommendations   --balanced diet high in fiber and protein, low in sugars, carbs, fats. --physical activity/exercise 20-30 minutes 3-5 times a week    Immunization History  Administered Date(s) Administered   DTP 05/31/1990, 08/20/1990, 10/23/1990, 03/24/1992, 08/03/1995   HIB (PRP-OMP) 05/31/1990, 08/20/1990, 10/23/1990, 08/07/1991   HPV Quadrivalent 10/14/2010, 05/23/2011, 02/12/2012   Hepatitis B 01/25/2009   Hepatitis B, PED/ADOLESCENT 07/16/2015   Influenza Split 04/01/2012   MMR 08/07/1991, 08/03/1995   OPV 06/18/1990, 08/20/1990, 03/24/1992, 08/03/1995   PFIZER(Purple Top)SARS-COV-2 Vaccination 11/12/2019, 12/03/2019, 05/26/2020   Pneumococcal Polysaccharide-23 07/22/2013   Td 11/05/2001   Tdap 04/01/2012, 07/07/2022    Health Maintenance  Topic Date Due   Pneumococcal Vaccine 64-56 Years old (2 of 2 - PCV) 07/22/2014   COVID-19 Vaccine (4 - 2024-25 season) 02/18/2023   INFLUENZA VACCINE  09/17/2023 (Originally 01/18/2023)   Cervical Cancer Screening (HPV/Pap Cotest)  11/18/2023   DTaP/Tdap/Td (9 - Td or Tdap) 07/07/2032   HPV VACCINES  Completed   Hepatitis C Screening  Completed   HIV Screening  Completed    Discussed health benefits of physical activity, and encouraged her to engage in regular exercise appropriate for her age and condition.  1. Annual physical exam (Primary) - Lipid panel - CBC w/Diff - Comp Met (CMET) - TSH  2. 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 Cont zyrtec daily, advised pepcid prn reaction symptoms, and rx epipen  - EPINEPHrine 0.3 mg/0.3 mL IJ SOAJ injection; Inject 0.3 mg into the muscle as needed for anaphylaxis.  Dispense: 2 each; Refill: 1  3. Mild intermittent asthma without complication Cont wixela, prn albuterol - albuterol (VENTOLIN HFA) 108 (90 Base) MCG/ACT inhaler; Inhale 2 puffs into the lungs every 6 (six) hours as needed for wheezing or shortness of breath.  Dispense: 8 g; Refill: 2 - fluticasone-salmeterol (WIXELA INHUB) 100-50 MCG/ACT AEPB;  Inhale 1 puff into the lungs 2 (two) times daily.  Dispense: 60 each; Refill: 3   5. Need for influenza vaccination - Flu vaccine trivalent PF, 6mos and older(Flulaval,Afluria,Fluarix,Fluzone)  Return in about 1 year (around 07/12/2024) for CPE.     Alfredia Ferguson, PA-C  [facility] [phone] (phone) [phone] (fax)  [facility]

## 2023-07-13 ENCOUNTER — Other Ambulatory Visit (HOSPITAL_BASED_OUTPATIENT_CLINIC_OR_DEPARTMENT_OTHER): Payer: Self-pay

## 2023-07-13 ENCOUNTER — Ambulatory Visit (INDEPENDENT_AMBULATORY_CARE_PROVIDER_SITE_OTHER): Payer: BC Managed Care – PPO | Admitting: Physician Assistant

## 2023-07-13 ENCOUNTER — Encounter: Payer: Self-pay | Admitting: Physician Assistant

## 2023-07-13 VITALS — BP 110/78 | HR 90 | Temp 98.0°F | Resp 18 | Ht 65.0 in | Wt 134.6 lb

## 2023-07-13 DIAGNOSIS — Z91018 Allergy to other foods: Secondary | ICD-10-CM | POA: Diagnosis not present

## 2023-07-13 DIAGNOSIS — Z0000 Encounter for general adult medical examination without abnormal findings: Secondary | ICD-10-CM | POA: Diagnosis not present

## 2023-07-13 DIAGNOSIS — Z23 Encounter for immunization: Secondary | ICD-10-CM

## 2023-07-13 DIAGNOSIS — F419 Anxiety disorder, unspecified: Secondary | ICD-10-CM | POA: Insufficient documentation

## 2023-07-13 DIAGNOSIS — J4520 Mild intermittent asthma, uncomplicated: Secondary | ICD-10-CM | POA: Diagnosis not present

## 2023-07-13 DIAGNOSIS — K219 Gastro-esophageal reflux disease without esophagitis: Secondary | ICD-10-CM | POA: Insufficient documentation

## 2023-07-13 LAB — LIPID PANEL
Cholesterol: 153 mg/dL (ref 0–200)
HDL: 64.4 mg/dL (ref >39.00)
LDL Cholesterol: 79 mg/dL (ref 0–99)
NonHDL: 88.13
Total CHOL/HDL Ratio: 2
Triglycerides: 46 mg/dL (ref 0.0–149.0)
VLDL: 9.2 mg/dL (ref 0.0–40.0)

## 2023-07-13 LAB — COMPREHENSIVE METABOLIC PANEL
ALT: 17 U/L (ref 0–35)
AST: 16 U/L (ref 0–37)
Albumin: 4.5 g/dL (ref 3.5–5.2)
Alkaline Phosphatase: 55 U/L (ref 39–117)
BUN: 11 mg/dL (ref 6–23)
CO2: 25 meq/L (ref 19–32)
Calcium: 8.7 mg/dL (ref 8.4–10.5)
Chloride: 108 meq/L (ref 96–112)
Creatinine, Ser: 0.58 mg/dL (ref 0.40–1.20)
GFR: 118.99 mL/min (ref >60.00)
Glucose, Bld: 80 mg/dL (ref 70–99)
Potassium: 4.1 meq/L (ref 3.5–5.1)
Sodium: 139 meq/L (ref 135–145)
Total Bilirubin: 0.5 mg/dL (ref 0.2–1.2)
Total Protein: 6.4 g/dL (ref 6.0–8.3)

## 2023-07-13 LAB — CBC WITH DIFFERENTIAL/PLATELET
Basophils Absolute: 0.1 10*3/uL (ref 0.0–0.1)
Basophils Relative: 1.3 % (ref 0.0–3.0)
Eosinophils Absolute: 0.6 10*3/uL (ref 0.0–0.7)
Eosinophils Relative: 10.8 % — ABNORMAL HIGH (ref 0.0–5.0)
HCT: 38.2 % (ref 36.0–46.0)
Hemoglobin: 12.8 g/dL (ref 12.0–15.0)
Lymphocytes Relative: 35.1 % (ref 12.0–46.0)
Lymphs Abs: 2 10*3/uL (ref 0.7–4.0)
MCHC: 33.5 g/dL (ref 30.0–36.0)
MCV: 92 fL (ref 78.0–100.0)
Monocytes Absolute: 0.2 10*3/uL (ref 0.1–1.0)
Monocytes Relative: 4.2 % (ref 3.0–12.0)
Neutro Abs: 2.7 10*3/uL (ref 1.4–7.7)
Neutrophils Relative %: 48.6 % (ref 43.0–77.0)
Platelets: 253 10*3/uL (ref 150.0–400.0)
RBC: 4.15 Mil/uL (ref 3.87–5.11)
RDW: 13 % (ref 11.5–15.5)
WBC: 5.7 10*3/uL (ref 4.0–10.5)

## 2023-07-13 LAB — TSH: TSH: 2.13 u[IU]/mL (ref 0.35–5.50)

## 2023-07-13 MED ORDER — ALBUTEROL SULFATE HFA 108 (90 BASE) MCG/ACT IN AERS
2.0000 | INHALATION_SPRAY | Freq: Four times a day (QID) | RESPIRATORY_TRACT | 2 refills | Status: AC | PRN
  Filled 2023-07-13: qty 6.7, 25d supply, fill #0
  Filled 2023-10-30: qty 6.7, 25d supply, fill #1
  Filled 2024-06-27: qty 6.7, 25d supply, fill #2

## 2023-07-13 MED ORDER — FLUTICASONE-SALMETEROL 100-50 MCG/ACT IN AEPB
1.0000 | INHALATION_SPRAY | Freq: Two times a day (BID) | RESPIRATORY_TRACT | 3 refills | Status: AC
  Filled 2023-07-13 – 2023-08-02 (×2): qty 60, 30d supply, fill #0
  Filled 2023-10-01: qty 60, 30d supply, fill #1
  Filled 2023-11-23 – 2024-04-11 (×2): qty 60, 30d supply, fill #2

## 2023-07-13 MED ORDER — EPINEPHRINE 0.3 MG/0.3ML IJ SOAJ
0.3000 mg | INTRAMUSCULAR | 1 refills | Status: AC | PRN
  Filled 2023-07-13: qty 2, 2d supply, fill #0
  Filled 2023-11-03: qty 2, 30d supply, fill #0

## 2023-07-13 NOTE — Assessment & Plan Note (Signed)
Cont zyrtec daily, advised pepcid prn reaction symptoms, and rx epipen

## 2023-07-13 NOTE — Assessment & Plan Note (Signed)
Cont wixela, prn albuterol

## 2023-07-16 ENCOUNTER — Encounter: Payer: Self-pay | Admitting: Physician Assistant

## 2023-07-17 ENCOUNTER — Other Ambulatory Visit (HOSPITAL_BASED_OUTPATIENT_CLINIC_OR_DEPARTMENT_OTHER): Payer: Self-pay

## 2023-07-19 ENCOUNTER — Ambulatory Visit: Payer: BC Managed Care – PPO | Admitting: Psychology

## 2023-07-22 ENCOUNTER — Ambulatory Visit: Payer: BC Managed Care – PPO | Admitting: Neurology

## 2023-07-22 DIAGNOSIS — H814 Vertigo of central origin: Secondary | ICD-10-CM

## 2023-07-22 DIAGNOSIS — G4733 Obstructive sleep apnea (adult) (pediatric): Secondary | ICD-10-CM | POA: Diagnosis not present

## 2023-07-22 DIAGNOSIS — G4710 Hypersomnia, unspecified: Secondary | ICD-10-CM

## 2023-07-22 DIAGNOSIS — F988 Other specified behavioral and emotional disorders with onset usually occurring in childhood and adolescence: Secondary | ICD-10-CM

## 2023-07-23 ENCOUNTER — Ambulatory Visit: Payer: BC Managed Care – PPO | Admitting: Neurology

## 2023-07-23 ENCOUNTER — Other Ambulatory Visit: Payer: Self-pay | Admitting: Neurology

## 2023-07-23 ENCOUNTER — Other Ambulatory Visit: Payer: Self-pay

## 2023-07-23 DIAGNOSIS — T50905A Adverse effect of unspecified drugs, medicaments and biological substances, initial encounter: Secondary | ICD-10-CM

## 2023-07-23 DIAGNOSIS — G4733 Obstructive sleep apnea (adult) (pediatric): Secondary | ICD-10-CM

## 2023-07-23 DIAGNOSIS — G4710 Hypersomnia, unspecified: Secondary | ICD-10-CM

## 2023-07-23 DIAGNOSIS — F988 Other specified behavioral and emotional disorders with onset usually occurring in childhood and adolescence: Secondary | ICD-10-CM

## 2023-07-23 DIAGNOSIS — G4712 Idiopathic hypersomnia without long sleep time: Secondary | ICD-10-CM | POA: Diagnosis not present

## 2023-07-23 DIAGNOSIS — G47411 Narcolepsy with cataplexy: Secondary | ICD-10-CM

## 2023-07-23 DIAGNOSIS — Z79899 Other long term (current) drug therapy: Secondary | ICD-10-CM

## 2023-07-26 LAB — COMPREHENSIVE DRUG ANALYSIS,UR

## 2023-08-02 ENCOUNTER — Other Ambulatory Visit: Payer: Self-pay

## 2023-08-02 ENCOUNTER — Encounter: Payer: Self-pay | Admitting: Neurology

## 2023-08-02 ENCOUNTER — Other Ambulatory Visit (HOSPITAL_BASED_OUTPATIENT_CLINIC_OR_DEPARTMENT_OTHER): Payer: Self-pay

## 2023-08-02 DIAGNOSIS — G4710 Hypersomnia, unspecified: Secondary | ICD-10-CM | POA: Insufficient documentation

## 2023-08-02 DIAGNOSIS — G4733 Obstructive sleep apnea (adult) (pediatric): Secondary | ICD-10-CM | POA: Insufficient documentation

## 2023-08-02 NOTE — Procedures (Signed)
[facility] POLYSOMNOGRAPHY  INTERPRETATION REPORT   STUDY DATE:  07/22/2023     PATIENT NAME:  Brandi Hall         DATE OF BIRTH:  10-May-1991  PATIENT ID:  [id number]    TYPE OF STUDY:  CPAP  READING PHYSICIAN: Melvyn Novas, MD REFERRED BY:  SCORING TECHNICIAN: Margaretann Loveless, RPSGT   HISTORY:  This 33 year-old Female patient is to be tested for narcolepsy and will undergo PSG and MSLT.  She has known supine dependent and REM dependent sleep apnea.  ADDITIONAL INFORMATION:  The Epworth Sleepiness Scale endorsed at 12 /24 points (scores above or equal to 10 are suggestive of hypersomnolence)..  Height: 66 in Weight: 139 lbs (BMI 22) Neck Size: 13 in  MEDICATIONS: Tylenol, Ventolin, Xanax, Adderall XR, Bentyl, Advair, Claritin, Prilosec, Wegovy, Sunosi, Allergy Relief Eye Drops, Nasacort . Ms Lartigue weaned off medication that can interfere with REM sleep.   TECHNICAL DESCRIPTION: A registered sleep technologist ( RPSGT)  was in attendance for the duration of the recording.  Data collection, scoring, video monitoring, and reporting were performed in compliance with the AASM Manual for the Scoring of Sleep and Associated Events; Hypopnea is scored based on the criteria listed in Section VIII D. 1a in the AASM Manual V2.6 using 3% oxygen desaturation and /or arousal rule).   SLEEP CONTINUITY AND SLEEP ARCHITECTURE:  Lights-out was at 21:18: and lights-on at  06:20:, with  9.0 hours of recording time . Total sleep time ( TST) was 403.5 minutes with a decreased sleep efficiency at 74.4%.   There was 12.9% REM sleep. Total supine REM sleep time was 52 minutes (100% of total REM sleep).  Sleep latency was normal at 28.5 minutes.  REM sleep latency was increased at 251.0 minutes. Of the total sleep time, the percentage of stage N1 sleep was 4.0%, stage N2 sleep was 65%, stage N3 sleep was 18.1%, and REM sleep was 12.9%.  There were 3 Stage R periods observed  on this study night, 21 awakenings (i.e. transitions to Stage W from any sleep stage), and 66 total stage transitions. Wake after sleep onset (WASO) time accounted for 110 minutes.   BODY POSITION:  TST was divided between the following sleep positions: 84.8% supine; 15.2% lateral; 0% prone. Duration of total sleep and percent of total sleep in their respective position is as follows: supine 342 minutes (85%), non-supine 62 minutes (15%); right 61 minutes (15%), left 00 minutes (0%), and prone 00 minutes (0%).   RESPIRATORY MONITORING:   Based on CMS criteria (using a 4% oxygen desaturation rule for scoring hypopneas), there were 0 apneas (0 obstructive; 0 central; 0 mixed), and 10 hypopneas.  Apnea index was 0.0. Hypopnea index was 1.5h/.  The AHI= apnea-hypopnea index was 1.5/h overall (1.8 supine, 0 non-supine; 0.0 REM, 0.0 supine REM).  There were 0 respiratory effort-related arousals (RERAs).   Based on AASM criteria (using a 3% oxygen desaturation and /or arousal rule for scoring hypopneas), there were 0 apneas (0 obstructive; 0 central; 0 mixed), and 20 hypopneas. Apnea index was 0.0. Hypopnea index was 3.0. The apnea-hypopnea index was 3.0 overall (3.3 supine, 0 non-supine; 1.2 REM, 1.2 supine REM).  There were 0 respiratory effort-related arousals (RERAs).   OXIMETRY: Oxyhemoglobin Saturation Nadir during sleep was at  89% from a mean of 97%.  Of the Total sleep time (TST) in hypoxemia (<89%) was present for  0.0 minutes, or 0.0% of total sleep  time.  LIMB MOVEMENTS: There were 0 periodic limb movements of sleep (0.0/hr). AROUSAL: There were 80 arousals in total, for an arousal index of 12 arousals/hour.  Of these, 11 were identified as respiratory-related arousals (2 /h), 0 were PLM-related arousals (0 /h), and 75 were non-specific arousals (11 /h). There were 0 occurrences of Cheyne Stokes breathing.   EEG:  PSG EEG was of normal amplitude and frequency, with symmetric manifestation of sleep  stages. EKG: The electrocardiogram documented NSR.  The average heart rate during sleep was 74 bpm.  The heart rate during sleep varied between a minimum of 60 bpm and maximum of  104 bpm  IMPRESSION: Valid PSG for MSLT to follow.  Melvyn Novas,  MD             Brandi Hall is a 33 y.o. female patient who is here for revisit 02/06/2023 for follow up on sleepiness, forgetfulness and apnea- she ws diagnosed with OSA after we wanted to initiate a narcolepsy work up, but has trouble complying with CPAP therapy- she is taking the mask off in the night and finds it next to her, averaging less than 29% . She has supine dependent , REM sleep dependent apnea: The total APNEA/HYPOPNEA INDEX (AHI) was 19.4/hour and the total RESPIRATORY DISTURBANCE INDEX was 19.4 /hour. 47 events occurred in REM sleep and 124 events in NREM. The REM AHI was 56.4 /hour, versus a non-REM AHI of 12.9. The patient spent 238.5 minutes of total sleep time in the supine position and 99 minutes in non-supine.. The supine AHI was 26.7 versus a non-supine AHI of 1.8. Chief concern according to patient : adderall is making me sleepy- my appointment with dr Kieth Brightly is pending for 04-30-2023. Sunosi helps with sleepiness. Adderall for attention. Advair for asthma, Dr. Drue Novel. Wegovy has helped with weight loss, BMI is 22.47. she lost from 180 pounds and is now at 140. Migraines and neck pain, migraines with aura. She was seen here for hypersomnia as well. She lost weight on Wegovy. She has has OSA . She is using CPAP compliantly. Her husband is reporting that she no longer snores, gets better , restful sleep, Sunosi is taken for hypersomnia.  Tylenol, Ventolin, Xanax, Adderall XR, Bentyl, Advair, Claritin, Prilosec, Wegovy, Sunosi, Allergy Relief Eye Drops, Nasacort  Sleep Disorder    Comments  The patient came into the lab for a CPAP/MSLT. Per the patient she stopped taking Sunosi and Adderall 3 weeks ago. The patient had a  prior PSG on 08/02/20 with our sleep lab. The total AHI was 19.4/hr, REM AHI 56.4/hr and NREM AHI 12.9/hr. The patient was fitted with a ResMed F40 (FFM) size SW. The mask was a good fit to face, and the patient tolerate the mask well. The patient wanted to be fitted with a FFM due to having frequent congestion related to allergies. CPAP was started at [facility] with no EPR per MD's order. CPAP was increased to 8cmH2O with no EPR for a few respiratory events while supine. The patient was informed by MD avoid the supine position. No restroom breaks. EKG showed no obvious cardiac arrhythmias. No snoring. All sleep stages witnessed. Respiratory events scored with a 3% desat. Slept lateral and supine. AHI was 3.5 after 2 hrs of TST. The patient had delayed sleep onset.    Lights out: 09:18:15 PM Lights on: 06:20:14 AM   Time Total Supine Side Prone Upright  Recording (TRT) 9h 2.30m 6h 38.75m 2h 24.33m 0h 0.1m 0h 0.1m  Sleep (TST) 6h 43.83m 5h 42.69m 1h 1.67m 0h 0.34m 0h 0.56m   Latency N1 N2 N3 REM Onset Per. Slp. Eff.  Actual 0h 28.75m 2h 4.64m 2h 19.18m 4h 11.54m 0h 28.66m 2h 7.3m 74.45%   Stg Dur Wake N1 N2 N3 REM  Total 110.0 16.0 262.5 73.0 52.0  Supine 27.5 9.5 250.0 30.5 52.0  Side 82.5 6.5 12.5 42.5 0.0  Prone 0.0 0.0 0.0 0.0 0.0  Upright 0.0 0.0 0.0 0.0 0.0   Stg % Wake N1 N2 N3 REM  Total 21.4 4.0 65.1 18.1 12.9  Supine 5.4 2.4 62.0 7.6 12.9  Side 16.1 1.6 3.1 10.5 0.0  Prone 0.0 0.0 0.0 0.0 0.0  Upright 0.0 0.0 0.0 0.0 0.0     Apnea Summary Sub Supine Side Prone Upright  Total 0 Total 0 0 0 0 0    REM 0 0 0 0 0    NREM 0 0 0 0 0  Obs 0 REM 0 0 0 0 0    NREM 0 0 0 0 0  Mix 0 REM 0 0 0 0 0    NREM 0 0 0 0 0  Cen 0 REM 0 0 0 0 0    NREM 0 0 0 0 0   Rera Summary Sub Supine Side Prone Upright  Total 0 Total 0 0 0 0 0    REM 0 0 0 0 0    NREM 0 0 0 0 0   Hypopnea Summary Sub Supine Side Prone Upright  Total 20 Total 20 19 1  0 0    REM 1 1 0 0 0    NREM 19 18 1  0 0   4% Hypopnea Summary  Sub Supine Side Prone Upright  Total (4%) 10 Total 10 10 0 0 0    REM 0 0 0 0 0    NREM 10 10 0 0 0     AHI Total Obs Mix Cen  2.97 Apnea 0.00 0.00 0.00 0.00   Hypopnea 2.97 -- -- --  1.49 Hypopnea (4%) 1.49 -- -- --    Total Supine Side Prone Upright  Position AHI 2.97 3.33 0.98 0.00 0.00  REM AHI 1.15   NREM AHI 3.24   Position RDI 2.97 3.33 0.98 0.00 0.00  REM RDI 1.15   NREM RDI 3.24    4% Hypopnea Total Supine Side Prone Upright  Position AHI (4%) 1.49 1.75 0.00 0.00 0.00  REM AHI (4%) 0.00   NREM AHI (4%) 1.71   Position RDI (4%) 1.49 1.75 0.00 0.00 0.00  REM RDI (4%) 0.00   NREM RDI (4%) 1.71    Desaturation Information Threshold: 2% <100% <90% <80% <70% <60% <50% <40%  Supine 129.0 1.0 0.0 0.0 0.0 0.0 0.0  Side 28.0 1.0 0.0 0.0 0.0 0.0 0.0  Prone 0.0 0.0 0.0 0.0 0.0 0.0 0.0  Upright 0.0 0.0 0.0 0.0 0.0 0.0 0.0  Total 157.0 2.0 0.0 0.0 0.0 0.0 0.0  Index 18.3 0.2 0.0 0.0 0.0 0.0 0.0   Threshold: 3% <100% <90% <80% <70% <60% <50% <40%  Supine 24.0 1.0 0.0 0.0 0.0 0.0 0.0  Side 6.0 1.0 0.0 0.0 0.0 0.0 0.0  Prone 0.0 0.0 0.0 0.0 0.0 0.0 0.0  Upright 0.0 0.0 0.0 0.0 0.0 0.0 0.0  Total 30.0 2.0 0.0 0.0 0.0 0.0 0.0  Index 3.5 0.2 0.0 0.0 0.0 0.0 0.0   Threshold: 4% <100% <90% <80% <70% <60% <50% <40%  Supine  13.0 1.0 0.0 0.0 0.0 0.0 0.0  Side 3.0 1.0 0.0 0.0 0.0 0.0 0.0  Prone 0.0 0.0 0.0 0.0 0.0 0.0 0.0  Upright 0.0 0.0 0.0 0.0 0.0 0.0 0.0  Total 16.0 2.0 0.0 0.0 0.0 0.0 0.0  Index 1.9 0.2 0.0 0.0 0.0 0.0 0.0   Threshold: 4% <100% <90% <80% <70% <60% <50% <40%  Supine 13 1 0 0 0 0 0  Side 3 1 0 0 0 0 0  Prone 0 0 0 0 0 0 0  Upright 0 0 0 0 0 0 0  Total 16 2 0 0 0 0 0   Awakening/Arousal Information # of Awakenings 21  Wake after sleep onset 110.50m  Wake after persistent sleep 19.48m   Arousal Assoc. Arousals Index  Apneas 0 0.0  Hypopneas 11 1.6  Leg Movements 0 0.0  Snore 0 0.0  PTT Arousals 0 0.0  Spontaneous 75 11.2  Total 86 12.8  Leg  Movement Information PLMS LMs Index  Total LMs during PLMS 0 0.0  LMs w/ Microarousals 0 0.0   LM LMs Index  w/ Microarousal 0 0.0  w/ Awakening 0 0.0  w/ Resp Event 0 0.0  Spontaneous 0 0.0  Total 0 0.0     Desaturation threshold setting: 4% Minimum desaturation setting: 10 seconds SaO2 nadir: 79% The longest event was a 38 sec obstructive Hypopnea with a minimum SaO2 of 93%. The lowest SaO2 was 93% associated with a 23 sec obstructive Hypopnea. EKG Rates EKG Avg Max Min  Awake 87 107 64  Asleep 74 104 60

## 2023-08-02 NOTE — Procedures (Signed)
[facility]     Name: Brandi Hall, Brandi Hall MRN: [id number]  Study Date: 07/23/2023 Procedure #: 0 DOB: Jul 10, 1990  Protocol This is a 13 channel Multiple Sleep Latency Test comprised of 5 channels of EEG (T3-Cz, Cz-T4, F4-M1, C4-M1, O2-M1). 3 channels of Chin EMG, 4 channels of EOG and 1 channel for ECG. All channels were sampled at 256 Hz.  This polysomnographic procedure is designed to evaluate (1) the complaint of excessive daytime sleepiness by quantifying the time required to fall asleep and (2) the possibility of narcolepsy by checking for abnormally short latencies to REM sleep. Electrographic variables include EEG, EMG, EOG and ECG. Patients are monitored throughout four or five 20-minute opportunities to sleep (naps) at two-hour intervals. For each nap, the patient is allowed 20 minutes to fall asleep. Once asleep, the patient is awakened after 15 minutes. Between naps, the patient is kept as alert as possible. A sleep latency of 20 minutes indicates that no sleep occurred. Parametric Analysis Total Number of Naps 4   NAP # Time of Nap Sleep Latency (mins) REM Latency (mins) Sleep Time Percent Awake Time Percent   1 08:36 13.0 20.0 50 50       2 10:30 13.5 20.0 53 47    3 12:17 10.5 20.0 59 41       4 14:18 20.0 20.0 0 100     MSLT Summary of Naps  Sleepiness Index: 23.0  Mean Sleep Latency: 16.2  Number of Naps with REM Sleep: 0   Results from Preceding PSG Study  Sleep Onset Time 21:46 Sleep Efficiency (%) 74.45%  Rise Time 06:20 Sleep Latency (min) 28.1min  Total Sleep Time 6h 43.25min REM Latency (min) 2h 7.51min     General Information  Name: Brandi Hall, Brandi Hall BMI: 22 Physician: Melvyn Novas, MD  ID: [id number] Height: 66 in Technician: Irene Pap RPSGT  Sex: Female Weight: 139 lb Record: [id number]  Age: 33 [Jun 16, 1991] Date: 07/23/2023 Scorer: Irene Pap RPSGT   Nap 1 Nap Start: 08:36:29 AM Nap End: 09:04:22 AM    Latency  to Sleep Onset: 13.29m Total Sleep Time: 15.45m Stg Dur REM N1 N2 N3   0.89m 4.68m 10.84m 0.62m       Nap 2 Nap Start: 10:30:58 AM Nap End: 10:59:38 AM    Latency to Sleep Onset: 13.17m Total Sleep Time: 15.38m Stg Dur REM N1 N2 N3   0.74m 4.37m 11.26m 0.5m    Nap 3 Nap Start: 12:17:14 PM Nap End: 12:42:32 PM    Latency to Sleep Onset: 10.66m Total Sleep Time: 15.80m Stg Dur REM N1 N2 N3   0.92m 3.3m 12.49m 0.6m       Nap 4 Nap Start: 02:18:42 PM Nap End: 02:38:07 PM    Latency to Sleep Onset: 20.57m Total Sleep Time: 0.31m Stg Dur REM N1 N2 N3   0.9m 0.57m 0.60m 0.1m    Mean Sleep Latency: 16.43m Number of Sleep Onset REM Periods: 0                  Name: Brandi Hall, Brandi Hall MRN: [id number]  Study Date: 07/23/2023 Procedure #: 0 DOB: [birth year]  IMPRESSION:  This sleep study by MSLT offered 4 nap opportunities of 20 minute duration. The mean latency was too long to diagnose pathological hypersomnia. There were no REM sleep onsets to diagnose Narcolepsy.  The preceding polysomnography  was performed on CPAP therapy for OSA . AHI was 3.5 /h and valid to have MSLT to follow.  Sleep onset was severely delayed, may be a first night effect, but the patient experienced REM sleep.     RECOMMENDATIONS:  The main treatment options remain the same : Modafinil, Adderall or Ritalin. We are working on an idiopathic hypersomnia based approval for  additional medications.    I attest to having reviewed every epoch of the entire raw data recording prior to the issuance of this report in accordance with the Standards of the American Academy of Sleep Medicine. Melvyn Novas, MD

## 2023-08-08 ENCOUNTER — Encounter: Payer: Self-pay | Admitting: Neurology

## 2023-08-19 ENCOUNTER — Other Ambulatory Visit: Payer: Self-pay | Admitting: Neurology

## 2023-08-20 ENCOUNTER — Other Ambulatory Visit (HOSPITAL_BASED_OUTPATIENT_CLINIC_OR_DEPARTMENT_OTHER): Payer: Self-pay

## 2023-08-20 ENCOUNTER — Telehealth: Admitting: Family Medicine

## 2023-08-20 DIAGNOSIS — R3989 Other symptoms and signs involving the genitourinary system: Secondary | ICD-10-CM

## 2023-08-20 MED ORDER — NITROFURANTOIN MONOHYD MACRO 100 MG PO CAPS
100.0000 mg | ORAL_CAPSULE | Freq: Two times a day (BID) | ORAL | 0 refills | Status: AC
  Filled 2023-08-20: qty 10, 5d supply, fill #0

## 2023-08-20 MED ORDER — AMPHETAMINE-DEXTROAMPHET ER 10 MG PO CP24
10.0000 mg | ORAL_CAPSULE | Freq: Every day | ORAL | 0 refills | Status: DC
  Filled 2023-08-20: qty 30, 30d supply, fill #0

## 2023-08-20 NOTE — Progress Notes (Signed)
 Virtual Visit Consent   Brandi Hall, you are scheduled for a virtual visit with a [facility] provider today. Just as with appointments in the office, your consent must be obtained to participate. Your consent will be active for this visit and any virtual visit you may have with one of our providers in the next 365 days. If you have a MyChart account, a copy of this consent can be sent to you electronically.  As this is a virtual visit, video technology does not allow for your provider to perform a traditional examination. This may limit your provider's ability to fully assess your condition. If your provider identifies any concerns that need to be evaluated in person or the need to arrange testing (such as labs, EKG, etc.), we will make arrangements to do so. Although advances in technology are sophisticated, we cannot ensure that it will always work on either your end or our end. If the connection with a video visit is poor, the visit may have to be switched to a telephone visit. With either a video or telephone visit, we are not always able to ensure that we have a secure connection.  By engaging in this virtual visit, you consent to the provision of healthcare and authorize for your insurance to be billed (if applicable) for the services provided during this visit. Depending on your insurance coverage, you may receive a charge related to this service.  I need to obtain your verbal consent now. Are you willing to proceed with your visit today? Brandi Hall has provided verbal consent on 08/20/2023 for a virtual visit (video or telephone). Freddy Finner, NP  Date: 08/20/2023 10:03 AM   Virtual Visit via Video Note   I, Freddy Finner, connected with  Brandi Hall  ([id number], [birth year]) on 08/20/23 at 10:00 AM EST by a video-enabled telemedicine application and verified that I am speaking with the correct person using two identifiers.  Location: Patient: Virtual Visit Location  Patient: Home Provider: Virtual Visit Location Provider: Home Office   I discussed the limitations of evaluation and management by telemedicine and the availability of in person appointments. The patient expressed understanding and agreed to proceed.    History of Present Illness: Brandi Hall is a 33 y.o. 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   Vestibular migraine 08/02/2022   ADD (attention deficit disorder) without hyperactivity 08/02/2022   Abnormal sensory exam 11/29/2021   Vertigo of central origin 03/01/2021   OSA on CPAP 03/01/2021   Abnormal brain MRI 03/01/2021   Nasal septal deviation 02/22/2021   PCOS (polycystic ovarian syndrome) 12/15/2020   Narcolepsy with cataplexy 06/30/2020   Allergic rhinitis 07/06/2008   Asthma 07/06/2008    Allergies:  Allergies  Allergen Reactions   Molds & Smuts Itching and Shortness Of Breath    Other Reaction(s): Not available   Cat Dander    Dog Epithelium (Canis Lupus Familiaris)  Other Reaction(s): Not available   Soy Allergy (Obsolete) Nausea And Vomiting   Milk Protein Diarrhea    Other Reaction(s): Abdominal Pain   Wound Dressing Adhesive Rash    Glue on Bandaids   Medications:  Current Outpatient Medications:    acetaminophen (TYLENOL) 500 MG tablet, Take 1,000 mg by mouth every 6 (six) hours as needed for mild pain., Disp: , Rfl:    albuterol (VENTOLIN HFA) 108 (90 Base) MCG/ACT  inhaler, Inhale 2 puffs into the lungs every 6 (six) hours as needed for wheezing or shortness of breath., Disp: 8 g, Rfl: 2   ALPRAZolam (XANAX) 0.25 MG tablet, Take 1 tablet (0.25 mg total) by mouth at bedtime as needed for anxiety., Disp: 30 tablet, Rfl: 0   amphetamine-dextroamphetamine (ADDERALL XR) 10 MG 24 hr capsule, Take 1 capsule (10 mg total) by mouth daily., Disp: 30 capsule, Rfl: 0   dicyclomine (BENTYL) 10 MG capsule, Take 1 capsule (10 mg total) by mouth 3 (three) times daily as needed for spasms., Disp: 30 capsule, Rfl: 1   EPINEPHrine 0.3 mg/0.3 mL IJ SOAJ injection, Inject 0.3 mg into the muscle as needed for anaphylaxis., Disp: 2 each, Rfl: 1   escitalopram (LEXAPRO) 10 MG tablet, Take 1 tablet (10 mg total) by mouth daily., Disp: 30 tablet, Rfl: 2   fluticasone-salmeterol (WIXELA INHUB) 100-50 MCG/ACT AEPB, Inhale 1 puff into the lungs 2 (two) times daily., Disp: 60 each, Rfl: 3   loratadine (CLARITIN) 10 MG tablet, Take 10 mg by mouth daily as needed for allergies., Disp: , Rfl:    omeprazole (PRILOSEC) 40 MG capsule, Take 1 capsule (40 mg total) by mouth 2 (two) times daily before a meal. (Patient taking differently: Take 40 mg by mouth daily.), Disp: 120 capsule, Rfl: 0   OVER THE COUNTER MEDICATION, Take 4 capsules by mouth daily. Myo-inositol, Disp: , Rfl:    Semaglutide-Weight Management (WEGOVY) 1.7 MG/0.75ML SOAJ, Inject 1.7 mg into the skin once a week., Disp: 3 mL, Rfl: 5   Solriamfetol HCl (SUNOSI) 150 MG TABS, TAKE 1 TABLET(150 MG) BY MOUTH IN THE MORNING, Disp: 30 tablet, Rfl: 5   Tetrahydrozoline-Zn Sulfate (EYE DROPS ALLERGY RELIEF OP), Place 1 drop into both eyes daily as needed (allergy)., Disp: , Rfl:    triamcinolone (NASACORT) 55 MCG/ACT AERO nasal inhaler, Place 2 sprays into the nose daily. 2 sprays each nostril at night., Disp: 1 each, Rfl: 12 No current facility-administered medications for this visit.  Facility-Administered Medications Ordered in Other  Visits:    gadobenate dimeglumine (MULTIHANCE) injection 15 mL, 15 mL, Intravenous, Once PRN, Dohmeier, Porfirio Mylar, MD  Observations/Objective: Patient is well-developed, well-nourished in no acute distress.  Resting comfortably  at home.  Head is normocephalic, atraumatic.  No labored breathing.  Speech is clear and coherent with logical content.  Patient is alert and oriented at baseline.    Assessment and Plan:   1. Suspected UTI (Primary)  - nitrofurantoin, macrocrystal-monohydrate, (MACROBID) 100 MG capsule; Take 1 capsule (100 mg total) by mouth 2 (two) times daily for 5 days.  Dispense: 10 capsule; Refill: 0  -no other red flags for stone or kidney infection -increase fluids -complete medication as discussed -prevention discussed and on AVS  Reviewed side effects, risks and benefits of medication.    Patient acknowledged agreement and understanding of the plan.   Past Medical, Surgical, Social History, Allergies, and Medications have been Reviewed.   Follow Up Instructions: I discussed the assessment and treatment plan with the  patient. The patient was provided an opportunity to ask questions and all were answered. The patient agreed with the plan and demonstrated an understanding of the instructions.  A copy of instructions were sent to the patient via MyChart unless otherwise noted below.    The patient was advised to call back or seek an in-person evaluation if the symptoms worsen or if the condition fails to improve as anticipated.    Freddy Finner, NP

## 2023-08-20 NOTE — Patient Instructions (Addendum)
 Glennon Hamilton, thank you for joining Freddy Finner, NP for today's virtual visit.  While this provider is not your primary care provider (PCP), if your PCP is located in our provider database this encounter information will be shared with them immediately following your visit.   A [place] MyChart account gives you access to today's visit and all your visits, tests, and labs performed at [facility] " click here if you don't have a [place] MyChart account or go to [email or web address]  Consent: (Patient) Brandi Hall provided verbal consent for this virtual visit at the beginning of the encounter.  Current Medications:  Current Outpatient Medications:    nitrofurantoin, macrocrystal-monohydrate, (MACROBID) 100 MG capsule, Take 1 capsule (100 mg total) by mouth 2 (two) times daily for 5 days., Disp: 10 capsule, Rfl: 0   acetaminophen (TYLENOL) 500 MG tablet, Take 1,000 mg by mouth every 6 (six) hours as needed for mild pain., Disp: , Rfl:    albuterol (VENTOLIN HFA) 108 (90 Base) MCG/ACT inhaler, Inhale 2 puffs into the lungs every 6 (six) hours as needed for wheezing or shortness of breath., Disp: 8 g, Rfl: 2   ALPRAZolam (XANAX) 0.25 MG tablet, Take 1 tablet (0.25 mg total) by mouth at bedtime as needed for anxiety., Disp: 30 tablet, Rfl: 0   amphetamine-dextroamphetamine (ADDERALL XR) 10 MG 24 hr capsule, Take 1 capsule (10 mg total) by mouth daily., Disp: 30 capsule, Rfl: 0   dicyclomine (BENTYL) 10 MG capsule, Take 1 capsule (10 mg total) by mouth 3 (three) times daily as needed for spasms., Disp: 30 capsule, Rfl: 1   EPINEPHrine 0.3 mg/0.3 mL IJ SOAJ injection, Inject 0.3 mg into the muscle as needed for anaphylaxis., Disp: 2 each, Rfl: 1   escitalopram (LEXAPRO) 10 MG tablet, Take 1 tablet (10 mg total) by mouth daily., Disp: 30 tablet, Rfl: 2   fluticasone-salmeterol (WIXELA INHUB) 100-50 MCG/ACT AEPB, Inhale 1 puff into the lungs 2 (two) times daily.,  Disp: 60 each, Rfl: 3   loratadine (CLARITIN) 10 MG tablet, Take 10 mg by mouth daily as needed for allergies., Disp: , Rfl:    omeprazole (PRILOSEC) 40 MG capsule, Take 1 capsule (40 mg total) by mouth 2 (two) times daily before a meal. (Patient taking differently: Take 40 mg by mouth daily.), Disp: 120 capsule, Rfl: 0   OVER THE COUNTER MEDICATION, Take 4 capsules by mouth daily. Myo-inositol, Disp: , Rfl:    Semaglutide-Weight Management (WEGOVY) 1.7 MG/0.75ML SOAJ, Inject 1.7 mg into the skin once a week., Disp: 3 mL, Rfl: 5   Solriamfetol HCl (SUNOSI) 150 MG TABS, TAKE 1 TABLET(150 MG) BY MOUTH IN THE MORNING, Disp: 30 tablet, Rfl: 5   Tetrahydrozoline-Zn Sulfate (EYE DROPS ALLERGY RELIEF OP), Place 1 drop into both eyes daily as needed (allergy)., Disp: , Rfl:    triamcinolone (NASACORT) 55 MCG/ACT AERO nasal inhaler, Place 2 sprays into the nose daily. 2 sprays each nostril at night., Disp: 1 each, Rfl: 12 No current facility-administered medications for this visit.  Facility-Administered Medications Ordered in Other Visits:    gadobenate dimeglumine (MULTIHANCE) injection 15 mL, 15 mL, Intravenous, Once PRN, Dohmeier, Porfirio Mylar, MD   Medications ordered in this encounter:  Meds ordered this encounter  Medications   nitrofurantoin, macrocrystal-monohydrate, (MACROBID) 100 MG capsule    Sig: Take 1 capsule (100 mg total) by mouth 2 (two) times daily for 5 days.    Dispense:  10 capsule    Refill:  0  Supervising Provider:   Merrilee Jansky [[id number]]     *If you need refills on other medications prior to your next appointment, please contact your pharmacy*  Follow-Up: Call back or seek an in-person evaluation if the symptoms worsen or if the condition fails to improve as anticipated.  [facility] [phone]  Other Instructions  Urinary Tract Infection, Female A urinary tract infection (UTI) is an infection in your urinary tract. The urinary tract is made up of  organs that make, store, and get rid of pee (urine) in your body. These organs include: The kidneys. The ureters. The bladder. The urethra. What are the causes? Most UTIs are caused by germs called bacteria. They may be in or near your genitals. These germs grow and cause swelling in your urinary tract. What increases the risk? You're more likely to get a UTI if: You're a female. The urethra is shorter in females than in males. You have a soft tube called a catheter that drains your pee. You can't control when you pee or poop. You have trouble peeing because of: A kidney stone. A urinary blockage. A nerve condition that affects your bladder. Not getting enough to drink. You're sexually active. You use a birth control inside your vagina, like spermicide. You're pregnant. You have low levels of the hormone estrogen in your body. You're an older adult. You're also more likely to get a UTI if you have other health problems. These may include: Diabetes. A weak immune system. Your immune system is your body's defense system. Sickle cell disease. Injury of the spine. What are the signs or symptoms? Symptoms may include: Needing to pee right away. Peeing small amounts often. Pain or burning when you pee. Blood in your pee. Pee that smells bad or odd. Pain in your belly or lower back. You may also: Feel confused. This may be the first symptom in older adults. Vomit. Not feel hungry. Feel tired or easily annoyed. Have a fever or chills. How is this diagnosed? A UTI is diagnosed based on your medical history and an exam. You may also have other tests. These may include: Pee tests. Blood tests. Tests for sexually transmitted infections (STIs). If you've had more than one UTI, you may need to have imaging studies done to find out why you keep getting them. How is this treated? A UTI can be treated by: Taking antibiotics or other medicines. Drinking enough fluid to keep your pee  pale yellow. In rare cases, a UTI can cause a very bad condition called sepsis. Sepsis may be treated in the hospital. Follow these instructions at home: Medicines Take your medicines only as told by your health care provider. If you were given antibiotics, take them as told by your provider. Do not stop taking them even if you start to feel better. General instructions Make sure you: Pee often and fully. Do not hold your pee for a long time. Wipe from front to back after you pee or poop. Use each tissue only once when you wipe. Pee after you have sex. Do not douche or use sprays or powders in your genital area. Contact a health care provider if: Your symptoms don't get better after 1-2 days of taking antibiotics. Your symptoms go away and then come back. You have a fever or chills. You vomit or feel like you may vomit. Get help right away if: You have very bad pain in your back or lower belly. You faint. This information is  not intended to replace advice given to you by your health care provider. Make sure you discuss any questions you have with your health care provider. Document Revised: 01/11/2023 Document Reviewed: 09/08/2022 Elsevier Patient Education  2024 Elsevier Inc.    If you have been instructed to have an in-person evaluation today at a local Urgent Care facility, please use the link below. It will take you to a list of all of our available [facility], including address, phone number and hours of operation. Please do not delay care.  [facility]  If you or a family member do not have a primary care provider, use the link below to schedule a visit and establish care. When you choose a [facility] primary care physician or advanced practice provider, you gain a long-term partner in health. Find a Primary Care Provider  Learn more about [facility]'s in-office and virtual care options: [facility] - Get Care Now

## 2023-08-20 NOTE — Telephone Encounter (Signed)
 Dr.Yan you are work in provider for PM.  Last seen on 02/06/23 Follow up scheduled 09/03/23 Last filled on 06/19/23 #30 tablets Rx pending to be signed
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
## 2023-08-30 ENCOUNTER — Encounter: Payer: Self-pay | Admitting: *Deleted

## 2023-09-03 ENCOUNTER — Ambulatory Visit: Payer: BC Managed Care – PPO | Admitting: Family Medicine

## 2023-09-03 ENCOUNTER — Telehealth: Payer: Self-pay | Admitting: Neurology

## 2023-09-03 ENCOUNTER — Telehealth: Admitting: Nurse Practitioner

## 2023-09-03 ENCOUNTER — Other Ambulatory Visit (HOSPITAL_BASED_OUTPATIENT_CLINIC_OR_DEPARTMENT_OTHER): Payer: Self-pay

## 2023-09-03 DIAGNOSIS — R3989 Other symptoms and signs involving the genitourinary system: Secondary | ICD-10-CM | POA: Diagnosis not present

## 2023-09-03 DIAGNOSIS — G4733 Obstructive sleep apnea (adult) (pediatric): Secondary | ICD-10-CM

## 2023-09-03 MED ORDER — CEPHALEXIN 500 MG PO CAPS
500.0000 mg | ORAL_CAPSULE | Freq: Two times a day (BID) | ORAL | 0 refills | Status: AC
  Filled 2023-09-03: qty 14, 7d supply, fill #0

## 2023-09-03 NOTE — Telephone Encounter (Signed)
 Pt cancelled appt due to being sick and need to go to urgent care

## 2023-09-03 NOTE — Progress Notes (Signed)
 Virtual Visit Consent   Brandi Hall, you are scheduled for a virtual visit with a [facility] provider today. Just as with appointments in the office, your consent must be obtained to participate. Your consent will be active for this visit and any virtual visit you may have with one of our providers in the next 365 days. If you have a MyChart account, a copy of this consent can be sent to you electronically.  As this is a virtual visit, video technology does not allow for your provider to perform a traditional examination. This may limit your provider's ability to fully assess your condition. If your provider identifies any concerns that need to be evaluated in person or the need to arrange testing (such as labs, EKG, etc.), we will make arrangements to do so. Although advances in technology are sophisticated, we cannot ensure that it will always work on either your end or our end. If the connection with a video visit is poor, the visit may have to be switched to a telephone visit. With either a video or telephone visit, we are not always able to ensure that we have a secure connection.  By engaging in this virtual visit, you consent to the provision of healthcare and authorize for your insurance to be billed (if applicable) for the services provided during this visit. Depending on your insurance coverage, you may receive a charge related to this service.  I need to obtain your verbal consent now. Are you willing to proceed with your visit today? Brandi Hall has provided verbal consent on 09/03/2023 for a virtual visit (video or telephone). Brandi Simas, FNP  Date: 09/03/2023 2:24 PM   Virtual Visit via Video Note   I, Brandi Hall, connected with  Brandi Hall  ([id number], February 12, 1991) on 09/03/23 at  2:15 PM EDT by a video-enabled telemedicine application and verified that I am speaking with the correct person using two identifiers.  Location: Patient: Virtual Visit Location  Patient: Home Provider: Virtual Visit Location Provider: Home Office   I discussed the limitations of evaluation and management by telemedicine and the availability of in person appointments. The patient expressed understanding and agreed to proceed.    History of Present Illness: Brandi Hall is a 33 y.o. who identifies as a female who was assigned female at birth, and is being seen today for symptoms of pain with urination and increased urinary frequency and burning with urination   Last night she started to feel the symptoms  She did use a boric acid suppository because she felt that she was having more vaginal discomfort she used that in the past without any SE   She was most recently treated with Macrobid   Denies back pain or fever    Problems:  Patient Active Problem List   Diagnosis Date Noted   OSA (obstructive sleep apnea) 08/02/2023   Hypersomnia 08/02/2023   Anxiety 07/13/2023   GERD (gastroesophageal reflux disease) 07/13/2023   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 07/13/2023   Vestibular migraine 08/02/2022   ADD (attention deficit disorder) without hyperactivity 08/02/2022   Abnormal sensory exam 11/29/2021   Vertigo of central origin 03/01/2021   OSA on CPAP 03/01/2021   Abnormal brain MRI 03/01/2021   Nasal septal deviation 02/22/2021   PCOS (polycystic ovarian syndrome) 12/15/2020   Narcolepsy with cataplexy 06/30/2020   Allergic rhinitis 07/06/2008   Asthma 07/06/2008    Allergies:  Allergies  Allergen Reactions   Molds & Smuts Itching and Shortness Of  Breath    Other Reaction(s): Not available   Cat Dander    Dog Epithelium (Canis Lupus Familiaris)     Other Reaction(s): Not available   Soy Allergy (Obsolete) Nausea And Vomiting   Milk Protein Diarrhea    Other Reaction(s): Abdominal Pain   Wound Dressing Adhesive Rash    Glue on Bandaids   Medications:  Current Outpatient Medications:    acetaminophen (TYLENOL) 500 MG tablet, Take 1,000 mg by mouth every 6  (six) hours as needed for mild pain., Disp: , Rfl:    albuterol (VENTOLIN HFA) 108 (90 Base) MCG/ACT inhaler, Inhale 2 puffs into the lungs every 6 (six) hours as needed for wheezing or shortness of breath., Disp: 8 g, Rfl: 2   ALPRAZolam (XANAX) 0.25 MG tablet, Take 1 tablet (0.25 mg total) by mouth at bedtime as needed for anxiety., Disp: 30 tablet, Rfl: 0   amphetamine-dextroamphetamine (ADDERALL XR) 10 MG 24 hr capsule, Take 1 capsule (10 mg total) by mouth daily., Disp: 30 capsule, Rfl: 0   dicyclomine (BENTYL) 10 MG capsule, Take 1 capsule (10 mg total) by mouth 3 (three) times daily as needed for spasms., Disp: 30 capsule, Rfl: 1   EPINEPHrine 0.3 mg/0.3 mL IJ SOAJ injection, Inject 0.3 mg into the muscle as needed for anaphylaxis., Disp: 2 each, Rfl: 1   escitalopram (LEXAPRO) 10 MG tablet, Take 1 tablet (10 mg total) by mouth daily., Disp: 30 tablet, Rfl: 2   fluticasone-salmeterol (WIXELA INHUB) 100-50 MCG/ACT AEPB, Inhale 1 puff into the lungs 2 (two) times daily., Disp: 60 each, Rfl: 3   loratadine (CLARITIN) 10 MG tablet, Take 10 mg by mouth daily as needed for allergies., Disp: , Rfl:    omeprazole (PRILOSEC) 40 MG capsule, Take 1 capsule (40 mg total) by mouth 2 (two) times daily before a meal. (Patient taking differently: Take 40 mg by mouth daily.), Disp: 120 capsule, Rfl: 0   OVER THE COUNTER MEDICATION, Take 4 capsules by mouth daily. Myo-inositol, Disp: , Rfl:    Semaglutide-Weight Management (WEGOVY) 1.7 MG/0.75ML SOAJ, Inject 1.7 mg into the skin once a week., Disp: 3 mL, Rfl: 5   Solriamfetol HCl (SUNOSI) 150 MG TABS, TAKE 1 TABLET(150 MG) BY MOUTH IN THE MORNING, Disp: 30 tablet, Rfl: 5   Tetrahydrozoline-Zn Sulfate (EYE DROPS ALLERGY RELIEF OP), Place 1 drop into both eyes daily as needed (allergy)., Disp: , Rfl:    triamcinolone (NASACORT) 55 MCG/ACT AERO nasal inhaler, Place 2 sprays into the nose daily. 2 sprays each nostril at night., Disp: 1 each, Rfl: 12 No current  facility-administered medications for this visit.  Facility-Administered Medications Ordered in Other Visits:    gadobenate dimeglumine (MULTIHANCE) injection 15 mL, 15 mL, Intravenous, Once PRN, Dohmeier, Porfirio Mylar, MD  Observations/Objective: Patient is well-developed, well-nourished in no acute distress.  Resting comfortably  at home.  Head is normocephalic, atraumatic.  No labored breathing.  Speech is clear and coherent with logical content.  Patient is alert and oriented at baseline.    Assessment and Plan:  1. Suspected UTI (Primary)  Push fluids and follow up with in person evaluation if vaginal symptoms or urinary symptoms persist as discussed   - cephALEXin (KEFLEX) 500 MG capsule; Take 1 capsule (500 mg total) by mouth 2 (two) times daily for 7 days.  Dispense: 14 capsule; Refill: 0      Follow Up Instructions: I discussed the assessment and treatment plan with the patient. The patient was provided an opportunity to ask  questions and all were answered. The patient agreed with the plan and demonstrated an understanding of the instructions.  A copy of instructions were sent to the patient via MyChart unless otherwise noted below.    The patient was advised to call back or seek an in-person evaluation if the symptoms worsen or if the condition fails to improve as anticipated.    Brandi Simas, FNP

## 2023-09-19 DIAGNOSIS — Z1330 Encounter for screening examination for mental health and behavioral disorders, unspecified: Secondary | ICD-10-CM | POA: Diagnosis not present

## 2023-09-19 DIAGNOSIS — Z9189 Other specified personal risk factors, not elsewhere classified: Secondary | ICD-10-CM | POA: Diagnosis not present

## 2023-09-19 DIAGNOSIS — Z803 Family history of malignant neoplasm of breast: Secondary | ICD-10-CM | POA: Diagnosis not present

## 2023-09-19 DIAGNOSIS — R92333 Mammographic heterogeneous density, bilateral breasts: Secondary | ICD-10-CM | POA: Diagnosis not present

## 2023-09-24 ENCOUNTER — Other Ambulatory Visit: Payer: Self-pay | Admitting: Neurology

## 2023-09-25 ENCOUNTER — Other Ambulatory Visit (HOSPITAL_BASED_OUTPATIENT_CLINIC_OR_DEPARTMENT_OTHER): Payer: Self-pay

## 2023-09-25 DIAGNOSIS — F411 Generalized anxiety disorder: Secondary | ICD-10-CM | POA: Diagnosis not present

## 2023-09-25 MED ORDER — PROPRANOLOL HCL 10 MG PO TABS
10.0000 mg | ORAL_TABLET | Freq: Every day | ORAL | 0 refills | Status: DC
  Filled 2023-09-25: qty 30, 30d supply, fill #0

## 2023-09-25 MED ORDER — SERTRALINE HCL 25 MG PO TABS
25.0000 mg | ORAL_TABLET | Freq: Every day | ORAL | 3 refills | Status: DC
  Filled 2023-09-25 (×2): qty 30, 30d supply, fill #0
  Filled 2023-10-30: qty 30, 30d supply, fill #1

## 2023-09-25 MED ORDER — AMPHETAMINE-DEXTROAMPHET ER 10 MG PO CP24
10.0000 mg | ORAL_CAPSULE | Freq: Every day | ORAL | 0 refills | Status: DC
  Filled 2023-09-25: qty 30, 30d supply, fill #0

## 2023-09-25 NOTE — Telephone Encounter (Signed)
 Last seen 02/06/23, next appt not scheduled   Dispenses   Dispensed Days Supply Quantity Provider Pharmacy  amphetamine-dextroamphetamine (ADDERALL XR) 10 MG 24 hr capsule 08/21/2023 30 30 capsule Levert Feinstein, MD [facility] -...  amphetamine-dextroamphetamine (ADDERALL XR) 10 MG 24 hr capsule 06/19/2023 30 30 capsule Dohmeier, Porfirio Mylar, MD [facility] -...  amphetamine-dextroamphetamine (ADDERALL XR) 10 MG 24 hr capsule 05/16/2023 30 30 capsule Windell Norfolk, MD [facility] -...  amphetamine-dextroamphetamine (ADDERALL XR) 10 MG 24 hr capsule 04/17/2023 30 30 capsule Dohmeier, Porfirio Mylar, MD [facility] -...  amphetamine-dextroamphetamine (ADDERALL XR) 10 MG 24 hr capsule 03/16/2023 30 30 capsule Dohmeier, Porfirio Mylar, MD [facility] -...  amphetamine-dextroamphetamine (ADDERALL XR) 10 MG 24 hr capsule 02/13/2023 30 30 capsule Dohmeier, Porfirio Mylar, MD [facility] -...  amphetamine-dextroamphetamine (ADDERALL XR) 10 MG 24 hr capsule 01/09/2023 30 30 capsule Dohmeier, Porfirio Mylar, MD [facility] -...  amphetamine-dextroamphetamine (ADDERALL XR) 10 MG 24 hr capsule 12/06/2022 30 30 capsule Dohmeier, Porfirio Mylar, MD [facility] -...  amphetamine-dextroamphetamine (ADDERALL XR) 10 MG 24 hr capsule 11/03/2022 30 30 capsule Dohmeier, Porfirio Mylar, MD [facility] -...  amphetamine-dextroamphetamine (ADDERALL XR) 10 MG 24 hr capsule 10/03/2022 30 30 capsule Dohmeier, Porfirio Mylar, MD [facility] -.Marland KitchenMarland Kitchen

## 2023-09-26 ENCOUNTER — Other Ambulatory Visit (HOSPITAL_BASED_OUTPATIENT_CLINIC_OR_DEPARTMENT_OTHER): Payer: Self-pay

## 2023-09-26 ENCOUNTER — Telehealth: Payer: Self-pay | Admitting: Pharmacy Technician

## 2023-09-26 ENCOUNTER — Other Ambulatory Visit (HOSPITAL_COMMUNITY): Payer: Self-pay

## 2023-09-26 DIAGNOSIS — F439 Reaction to severe stress, unspecified: Secondary | ICD-10-CM | POA: Diagnosis not present

## 2023-09-26 NOTE — Telephone Encounter (Signed)
 Pharmacy Patient Advocate Encounter   Received notification from CoverMyMeds that prior authorization for Sunosi 150MG  tablets is required/requested.   Insurance verification completed.   The patient is insured through [employer] .   Per test claim: Refill too soon. PA is not needed at this time. Medication was filled 09/17/2023. Next eligible fill date is 10/09/2023.

## 2023-10-03 DIAGNOSIS — F439 Reaction to severe stress, unspecified: Secondary | ICD-10-CM | POA: Diagnosis not present
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
## 2023-10-11 DIAGNOSIS — F439 Reaction to severe stress, unspecified: Secondary | ICD-10-CM | POA: Diagnosis not present

## 2023-10-15 DIAGNOSIS — Z803 Family history of malignant neoplasm of breast: Secondary | ICD-10-CM | POA: Diagnosis not present

## 2023-10-15 DIAGNOSIS — N6312 Unspecified lump in the right breast, upper inner quadrant: Secondary | ICD-10-CM | POA: Diagnosis not present

## 2023-10-15 DIAGNOSIS — R92333 Mammographic heterogeneous density, bilateral breasts: Secondary | ICD-10-CM | POA: Diagnosis not present

## 2023-10-15 DIAGNOSIS — N6313 Unspecified lump in the right breast, lower outer quadrant: Secondary | ICD-10-CM | POA: Diagnosis not present

## 2023-10-15 DIAGNOSIS — Z9189 Other specified personal risk factors, not elsewhere classified: Secondary | ICD-10-CM | POA: Diagnosis not present

## 2023-10-15 DIAGNOSIS — N6012 Diffuse cystic mastopathy of left breast: Secondary | ICD-10-CM | POA: Diagnosis not present

## 2023-10-15 DIAGNOSIS — N6011 Diffuse cystic mastopathy of right breast: Secondary | ICD-10-CM | POA: Diagnosis not present

## 2023-10-18 DIAGNOSIS — F439 Reaction to severe stress, unspecified: Secondary | ICD-10-CM | POA: Diagnosis not present

## 2023-10-19 ENCOUNTER — Telehealth: Payer: Self-pay

## 2023-10-19 ENCOUNTER — Other Ambulatory Visit (HOSPITAL_COMMUNITY): Payer: Self-pay

## 2023-10-19 NOTE — Telephone Encounter (Signed)
 Pharmacy Patient Advocate Encounter  Received notification from [employer] that Prior Authorization for Sunosi  150MG  tablets has been APPROVED from 09/19/2023 to 10/18/2024. Ran test claim, Copay is $9.28. This test claim was processed through [facility]- copay amounts may vary at other pharmacies due to pharmacy/plan contracts, or as the patient moves through the different stages of their insurance plan.   PA #/Case ID/Reference #: [id number]

## 2023-10-24 DIAGNOSIS — F439 Reaction to severe stress, unspecified: Secondary | ICD-10-CM | POA: Diagnosis not present

## 2023-10-30 ENCOUNTER — Other Ambulatory Visit: Payer: Self-pay

## 2023-10-30 ENCOUNTER — Other Ambulatory Visit (HOSPITAL_BASED_OUTPATIENT_CLINIC_OR_DEPARTMENT_OTHER): Payer: Self-pay

## 2023-10-30 ENCOUNTER — Other Ambulatory Visit: Payer: Self-pay | Admitting: Neurology

## 2023-10-30 MED ORDER — AMPHETAMINE-DEXTROAMPHET ER 10 MG PO CP24
10.0000 mg | ORAL_CAPSULE | Freq: Every day | ORAL | 0 refills | Status: DC
  Filled 2023-10-30: qty 30, 30d supply, fill #0

## 2023-10-30 NOTE — Telephone Encounter (Signed)
 Last seen on 02/06/23 No 6-7 month follow up scheduled     Dispensed Days Supply Quantity Provider Pharmacy  amphetamine -dextroamphetamine  (ADDERALL XR) 10 MG 24 hr capsule 09/25/2023 30 30 capsule Sater, Sherida Dimmer, MD [facility]      Rx pending to be signed

## 2023-10-31 ENCOUNTER — Other Ambulatory Visit (HOSPITAL_BASED_OUTPATIENT_CLINIC_OR_DEPARTMENT_OTHER): Payer: Self-pay

## 2023-10-31 ENCOUNTER — Other Ambulatory Visit: Payer: Self-pay

## 2023-10-31 MED ORDER — PROPRANOLOL HCL 10 MG PO TABS
ORAL_TABLET | ORAL | 0 refills | Status: AC
Start: 2023-10-30 — End: ?
  Filled 2023-10-31: qty 30, 30d supply, fill #0

## 2023-11-01 DIAGNOSIS — F439 Reaction to severe stress, unspecified: Secondary | ICD-10-CM | POA: Diagnosis not present

## 2023-11-03 ENCOUNTER — Other Ambulatory Visit: Payer: Self-pay

## 2023-11-06 ENCOUNTER — Other Ambulatory Visit (HOSPITAL_BASED_OUTPATIENT_CLINIC_OR_DEPARTMENT_OTHER): Payer: Self-pay

## 2023-11-06 DIAGNOSIS — F411 Generalized anxiety disorder: Secondary | ICD-10-CM | POA: Diagnosis not present

## 2023-11-06 DIAGNOSIS — N398 Other specified disorders of urinary system: Secondary | ICD-10-CM | POA: Diagnosis not present

## 2023-11-06 MED ORDER — SERTRALINE HCL 50 MG PO TABS
50.0000 mg | ORAL_TABLET | Freq: Every day | ORAL | 1 refills | Status: DC
Start: 2023-11-06 — End: 2024-01-07
  Filled 2023-11-06: qty 30, 30d supply, fill #0
  Filled 2023-12-19 (×2): qty 30, 30d supply, fill #1

## 2023-11-15 DIAGNOSIS — F439 Reaction to severe stress, unspecified: Secondary | ICD-10-CM | POA: Diagnosis not present

## 2023-11-22 DIAGNOSIS — F439 Reaction to severe stress, unspecified: Secondary | ICD-10-CM | POA: Diagnosis not present

## 2023-11-23 ENCOUNTER — Other Ambulatory Visit (HOSPITAL_BASED_OUTPATIENT_CLINIC_OR_DEPARTMENT_OTHER): Payer: Self-pay

## 2023-11-29 ENCOUNTER — Other Ambulatory Visit (HOSPITAL_BASED_OUTPATIENT_CLINIC_OR_DEPARTMENT_OTHER): Payer: Self-pay

## 2023-11-29 DIAGNOSIS — F439 Reaction to severe stress, unspecified: Secondary | ICD-10-CM | POA: Diagnosis not present

## 2023-12-03 ENCOUNTER — Other Ambulatory Visit (HOSPITAL_BASED_OUTPATIENT_CLINIC_OR_DEPARTMENT_OTHER): Payer: Self-pay

## 2023-12-04 DIAGNOSIS — F439 Reaction to severe stress, unspecified: Secondary | ICD-10-CM | POA: Diagnosis not present

## 2023-12-19 ENCOUNTER — Other Ambulatory Visit (HOSPITAL_BASED_OUTPATIENT_CLINIC_OR_DEPARTMENT_OTHER): Payer: Self-pay

## 2023-12-19 ENCOUNTER — Other Ambulatory Visit: Payer: Self-pay

## 2023-12-19 DIAGNOSIS — F439 Reaction to severe stress, unspecified: Secondary | ICD-10-CM | POA: Diagnosis not present

## 2023-12-20 ENCOUNTER — Other Ambulatory Visit (HOSPITAL_BASED_OUTPATIENT_CLINIC_OR_DEPARTMENT_OTHER): Payer: Self-pay

## 2023-12-20 ENCOUNTER — Other Ambulatory Visit: Payer: Self-pay

## 2023-12-25 ENCOUNTER — Other Ambulatory Visit (HOSPITAL_BASED_OUTPATIENT_CLINIC_OR_DEPARTMENT_OTHER): Payer: Self-pay

## 2023-12-25 DIAGNOSIS — F439 Reaction to severe stress, unspecified: Secondary | ICD-10-CM | POA: Diagnosis not present

## 2023-12-26 ENCOUNTER — Other Ambulatory Visit (HOSPITAL_BASED_OUTPATIENT_CLINIC_OR_DEPARTMENT_OTHER): Payer: Self-pay

## 2023-12-27 ENCOUNTER — Other Ambulatory Visit (HOSPITAL_BASED_OUTPATIENT_CLINIC_OR_DEPARTMENT_OTHER): Payer: Self-pay

## 2023-12-27 MED ORDER — WEGOVY 1 MG/0.5ML ~~LOC~~ SOAJ
1.0000 mg | SUBCUTANEOUS | 0 refills | Status: DC
  Filled 2023-12-27: qty 2, 28d supply, fill #0

## 2024-01-03 DIAGNOSIS — F439 Reaction to severe stress, unspecified: Secondary | ICD-10-CM | POA: Diagnosis not present

## 2024-01-07 ENCOUNTER — Other Ambulatory Visit (HOSPITAL_BASED_OUTPATIENT_CLINIC_OR_DEPARTMENT_OTHER): Payer: Self-pay

## 2024-01-07 DIAGNOSIS — F411 Generalized anxiety disorder: Secondary | ICD-10-CM | POA: Diagnosis not present

## 2024-01-07 MED ORDER — SERTRALINE HCL 50 MG PO TABS
50.0000 mg | ORAL_TABLET | Freq: Every day | ORAL | 3 refills | Status: AC
  Filled 2024-01-07 – 2024-01-18 (×2): qty 30, 30d supply, fill #0
  Filled 2024-02-19: qty 30, 30d supply, fill #1
  Filled 2024-03-28: qty 30, 30d supply, fill #2
  Filled 2024-04-25: qty 30, 30d supply, fill #3
  Filled 2024-05-27: qty 30, 30d supply, fill #4
  Filled 2024-06-27: qty 30, 30d supply, fill #5
  Filled ????-??-??: fill #0

## 2024-01-08 ENCOUNTER — Encounter: Payer: Self-pay | Admitting: Physical Therapy

## 2024-01-08 ENCOUNTER — Other Ambulatory Visit: Payer: Self-pay

## 2024-01-08 ENCOUNTER — Ambulatory Visit: Attending: Obstetrics | Admitting: Physical Therapy

## 2024-01-08 DIAGNOSIS — R252 Cramp and spasm: Secondary | ICD-10-CM | POA: Insufficient documentation

## 2024-01-08 NOTE — Therapy (Signed)
 OUTPATIENT PHYSICAL THERAPY FEMALE PELVIC EVALUATION   Patient Name: Brandi Hall MRN: [id number] DOB:1990-10-31, 33 y.o., female Today's Date: 01/08/2024  END OF SESSION:  PT End of Session - 01/08/24 1742     Visit Number 1    Date for PT Re-Evaluation 07/14/24    Authorization Type BCBS COMM PPO    PT Start Time 1537    PT Stop Time 1615    PT Time Calculation (min) 38 min    Activity Tolerance Patient tolerated treatment well    Behavior During Therapy WFL for tasks assessed/performed          Past Medical History:  Diagnosis Date   Allergic rhinitis    Anxiety    Asthma    Bronchitis    Lymph node abscess    hosp for infected lymph node (Salmonella) at 33 y/o   PCOS (polycystic ovarian syndrome)    Pneumonia    Post partum depression    Post term pregnancy, 41 weeks 12/18/2014   Postpartum care following vaginal delivery (7/1) 12/18/2014   Sleep apnea    CPAP @ [facility]   Past Surgical History:  Procedure Laterality Date   LAD bx     MASS EXCISION Right 02/22/2021   Procedure: EXCISION RIGHT NASAL POLYPOID MASS;  Surgeon: Ethyl Lonni FORBES, MD;  Location: [facility] OR;  Service: ENT;  Laterality: Right;   NASAL SEPTOPLASTY W/ TURBINOPLASTY N/A 02/22/2021   Procedure: NASAL SEPTOPLASTY WITH TURBINATE REDUCTION;  Surgeon: Ethyl Lonni FORBES, MD;  Location: [facility] OR;  Service: ENT;  Laterality: N/A;   WISDOM TOOTH EXTRACTION     Patient Active Problem List   Diagnosis Date Noted   OSA (obstructive sleep apnea) 08/02/2023   Hypersomnia 08/02/2023   Anxiety 07/13/2023   GERD (gastroesophageal reflux disease) 07/13/2023   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 07/13/2023   Vestibular migraine 08/02/2022   ADD (attention deficit disorder) without hyperactivity 08/02/2022   Abnormal sensory exam 11/29/2021   Vertigo of central origin 03/01/2021   OSA on CPAP 03/01/2021   Abnormal brain MRI 03/01/2021   Nasal septal deviation 02/22/2021   PCOS (polycystic ovarian syndrome) 12/15/2020    Narcolepsy with cataplexy 06/30/2020   Allergic rhinitis 07/06/2008   Asthma 07/06/2008    PCP: Cyndi Shaver, PA-C  REFERRING PROVIDER: Kandyce Sor, MD  REFERRING DIAG: N39.8 (ICD-10-CM) - Other specified disorders of urinary system   THERAPY DIAG:  Cramp and spasm  Rationale for Evaluation and Treatment: Rehabilitation  ONSET DATE: 9 months ago  SUBJECTIVE:  SUBJECTIVE STATEMENT: Patient reports that sometimes it takes a long time to pee could really have to go and comes out slowly, does everything she can to relax, does not feel like she completely empties and pushes out, has been going on for a long time, maybe it's inflammation. 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 Yes bladder NPRS scale: 2-5/10 PRECAUTIONS: None  RED FLAGS: None   WEIGHT BEARING RESTRICTIONS: No  FALLS:  Has patient fallen in last 6 months? No  OCCUPATION: dance teacher  ACTIVITY LEVEL : active  PLOF: Independent  PATIENT GOALS: would like to not feel like she will wet herself with jumping at work  PERTINENT HISTORY:  Hemorrhage with deliveries  Sexual abuse: No  BOWEL MOVEMENT: no issues- some GI issues, avoids trigger foods  URINATION: Pain with urination: No sometimes it feels like there is pressure, feels better after she pees, bladder hurts sometimes Fully empty bladder: No Stream: Weak sometimes Urgency: Yes with jumping Frequency: varies Leakage: none Pads:  No  INTERCOURSE:  Ability to have vaginal penetration Yes - on the left side Pain with intercourse: on the left side DrynessNo Climax: yes Marinoff Scale: 1/3   PREGNANCY: Vaginal deliveries 2- 38 yo and 33 yo- 4 hour labor with pushing, daughter had triple cord loop, hemorrhaged after she had them Tearing Yes:   Episiotomy No C-section deliveries no Currently pregnant No  PROLAPSE: None   OBJECTIVE:  Note: Objective measures were completed at Evaluation unless otherwise noted.  PATIENT SURVEYS:    PFIQ-7: to be completed  COGNITION: Overall cognitive status: Within functional limits for tasks assessed     SENSATION: Light touch: Appears intact  Single leg squat- no issues   FUNCTIONAL TESTS:  Single leg squat- within functional limitations Abdominal crunch- WFL  POSTURE: rounded shoulders and forward head   LUMBARAROM/PROM: full   LOWER EXTREMITY MNF:qloo  LOWER EXTREMITY MMT: WFL PALPATION:   General: tenderness LLQ, upper chest breathing, decreased lower rib expansion  Pelvic Alignment: even  Abdominal: tenderness LLQ                External Perineal Exam: good clitoral hood mobility, labia majora and minora visible                             Internal Pelvic Floor: mild urethrocele, good strength present  Patient confirms identification and approves PT to assess internal pelvic floor and treatment Yes Patient confirms identification and approves PT to assess internal pelvic floor and treatment Yes No emotional/communication barriers or cognitive limitation. Patient is motivated to learn. Patient understands and agrees with treatment goals and plan. PT explains patient will be examined in standing, sitting, and lying down to see how their muscles and joints work. When they are ready, they will be asked to remove their underwear so PT can examine their perineum. The patient is also given the option of providing their own chaperone as one is not provided  in our facility. The patient also has the right and is explained the right to defer or refuse any part of  the evaluation or treatment including the internal exam. With the patient's consent, PT will use one gloved finger to gently assess the muscles of the pelvic floor, seeing how well it contracts and relaxes and if there is muscle symmetry. After, the patient will get dressed and PT and patient will discuss exam findings and plan of care. PT and patient discuss plan of care, schedule, attendance policy and HEP activities.     PELVIC MMT:   MMT eval  Vaginal 5/5 15 sec  Internal Anal Sphincter   External Anal Sphincter   Puborectalis   Diastasis Recti 1 finger, good tone, no distortion  (Blank rows = not tested)  Difficulty with reverse kegel        TONE: high  PROLAPSE: Maybe mild urethrocele Very mild bladder prolapse in supine or standing  TODAY'S TREATMENT:                                                                                                                              DATE: 01/08/24    EVAL Examination completed, findings reviewed, pt educated on POC, HEP, and female pelvic floor anatomy, reasoning with pelvic floor assessment internally with pt consent. Pt motivated to participate in PT and agreeable to attempt recommendations.     PATIENT EDUCATION:  Education details: Pt was educated on relevant anatomy, exam findings, HEP, expectations of PT, vulvovaginal massage with lubricant- good clean love samples given   Person educated: Patient Education method: Explanation, Demonstration, Tactile cues, Verbal cues, and Handouts Education comprehension: verbalized understanding, returned demonstration, verbal cues required, tactile cues required, and needs further education  HOME EXERCISE PROGRAM: [id number]  ASSESSMENT:  CLINICAL IMPRESSION: High tone, patient with issue of diarrhea, a lot of gripping Patient is a 33 y.o. F who was seen today for physical therapy  evaluation and treatment for urinary urgency with dance and bladder pain with urinary hesitancy. Exam findings are notable for upper chest breathing strategies, abdominal tenderness on left lower quadrant, tight pelvic floor muscles with good strength but difficulty with bulging External soft tissues of pelvic floor are normal. Patient demonstrates trunk flexbility, bilateral hip flexibility, reduced range or motion in pelvic floor- with lengthening and pain . It is difficult for patient to jump at work due to feeling like she is going to leak . Discussed findings with patient, recommended vulvovaginal massage today,  and HEP was initiated. Patient's quality of life has been affected, patient will benefit from physical therapy to address deficits, reduce urinary urgency, hesitancy and bladder pain and improve QOL .   OBJECTIVE IMPAIRMENTS: decreased ROM, impaired sensation, impaired tone, and pain.   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   REHAB POTENTIAL: Good  CLINICAL DECISION MAKING: Evolving/moderate complexity  EVALUATION COMPLEXITY: Moderate   GOALS: Goals reviewed with patient? Yes  SHORT TERM GOALS: Target date:  02/05/2024    Patient will be educated on  bladder irritants Baseline: Goal status: INITIAL  2. Patient will complete a  bladder diary Baseline:  Goal status: INITIAL  3.  Pt will be independent with HEP.   Baseline:  Goal status: INITIAL  4.  Pt will be independent with the knack, urge suppression technique, and double voiding in order to improve bladder habits and decrease urinary incontinence.   Baseline:  Goal status: INITIAL  LONG TERM GOALS: Target date: 07/14/2024  Patient will report 0 leaking or urinary urgency with jumping 15 jumps Baseline:  Goal status: INITIAL  2.  Patient  will urinate within 10 seconds of sitting on the toilet Baseline:  Goal status: INITIAL  3.  Patient will demonstrate good lateral rib expansion and diaphragmatic breathing   Baseline:  Goal status: INITIAL  4.  Patient will be I with vulvovaginal massage to reduce pain Baseline:  Goal status: INITIAL  5.  Patient will dem good pelvic floor AROM/ PROM and be able to bulge her pelvic floor Baseline:  Goal status: INITIAL    PLAN:  PT FREQUENCY: 1-2x/week  PT DURATION: 6 months  PLANNED INTERVENTIONS: 97110-Therapeutic exercises, 97530- Therapeutic activity, 97112- Neuromuscular re-education, 97535- Self Care, [id number]- Manual therapy, [phone]- Electrical stimulation (manual), [phone] (1-2 muscles), [phone] (3+ muscles)- Dry Needling, Patient/Family education, Taping, Joint mobilization, Joint manipulation, Spinal manipulation, Spinal mobilization, Scar mobilization, Cryotherapy, Moist heat, and Biofeedback  PLAN FOR NEXT SESSION: urge suppression training, pelvic floor manual release, diaphragmatic breathing reed   Mana Haberl, PT 01/08/2024, 5:43 PM

## 2024-01-10 ENCOUNTER — Telehealth: Payer: Self-pay | Admitting: Neurology

## 2024-01-10 DIAGNOSIS — F439 Reaction to severe stress, unspecified: Secondary | ICD-10-CM | POA: Diagnosis not present

## 2024-01-10 NOTE — Telephone Encounter (Signed)
 [facility]/ Jared request refill for Solriamfetol  HCl (SUNOSI ) 150 MG TABS. Informed him patient has had been seen since 02/06/2023. Patient would need an appointment for refill to be approved,. Emeline stated, will call patient to deny refill and inform need to call [facility] to schedule an appointment.

## 2024-01-16 ENCOUNTER — Other Ambulatory Visit (HOSPITAL_BASED_OUTPATIENT_CLINIC_OR_DEPARTMENT_OTHER): Payer: Self-pay

## 2024-01-16 DIAGNOSIS — Z713 Dietary counseling and surveillance: Secondary | ICD-10-CM | POA: Diagnosis not present

## 2024-01-16 DIAGNOSIS — Z6820 Body mass index (BMI) 20.0-20.9, adult: Secondary | ICD-10-CM | POA: Diagnosis not present

## 2024-01-16 DIAGNOSIS — F439 Reaction to severe stress, unspecified: Secondary | ICD-10-CM | POA: Diagnosis not present

## 2024-01-16 MED ORDER — WEGOVY 1 MG/0.5ML ~~LOC~~ SOAJ
1.0000 mg | SUBCUTANEOUS | 2 refills | Status: AC
  Filled 2024-01-16 – 2024-02-06 (×2): qty 2, 28d supply, fill #0
  Filled 2024-03-28: qty 2, 28d supply, fill #1
  Filled 2024-04-25: qty 2, 28d supply, fill #2

## 2024-01-18 ENCOUNTER — Other Ambulatory Visit: Payer: Self-pay

## 2024-01-24 DIAGNOSIS — F439 Reaction to severe stress, unspecified: Secondary | ICD-10-CM | POA: Diagnosis not present

## 2024-01-28 ENCOUNTER — Ambulatory Visit: Admitting: Physical Therapy

## 2024-01-31 DIAGNOSIS — F439 Reaction to severe stress, unspecified: Secondary | ICD-10-CM | POA: Diagnosis not present

## 2024-02-06 ENCOUNTER — Other Ambulatory Visit: Payer: Self-pay | Admitting: Neurology

## 2024-02-06 ENCOUNTER — Other Ambulatory Visit (HOSPITAL_BASED_OUTPATIENT_CLINIC_OR_DEPARTMENT_OTHER): Payer: Self-pay

## 2024-02-07 ENCOUNTER — Other Ambulatory Visit (HOSPITAL_BASED_OUTPATIENT_CLINIC_OR_DEPARTMENT_OTHER): Payer: Self-pay

## 2024-02-07 ENCOUNTER — Other Ambulatory Visit: Payer: Self-pay

## 2024-02-07 DIAGNOSIS — F439 Reaction to severe stress, unspecified: Secondary | ICD-10-CM | POA: Diagnosis not present

## 2024-02-07 MED ORDER — AMPHETAMINE-DEXTROAMPHET ER 10 MG PO CP24
10.0000 mg | ORAL_CAPSULE | Freq: Every day | ORAL | 0 refills | Status: DC
  Filled 2024-02-07: qty 30, 30d supply, fill #0

## 2024-02-11 ENCOUNTER — Encounter: Payer: Self-pay | Admitting: Physical Therapy

## 2024-02-11 ENCOUNTER — Ambulatory Visit: Attending: Obstetrics | Admitting: Physical Therapy

## 2024-02-11 DIAGNOSIS — R252 Cramp and spasm: Secondary | ICD-10-CM | POA: Diagnosis not present

## 2024-02-11 NOTE — Therapy (Signed)
 OUTPATIENT PHYSICAL THERAPY FEMALE PELVIC TREATMENT   Patient Name: Brandi Hall MRN: [id number] DOB:[birth year], 33 y.o., female Today's Date: 02/11/2024  END OF SESSION:  PT End of Session - 02/11/24 0819     Visit Number 2    Date for PT Re-Evaluation 07/14/24    Authorization Type BCBS COMM PPO    PT Start Time 0819    PT Stop Time 0845    PT Time Calculation (min) 26 min    Activity Tolerance Patient tolerated treatment well    Behavior During Therapy WFL for tasks assessed/performed          Past Medical History:  Diagnosis Date   Allergic rhinitis    Anxiety    Asthma    Bronchitis    Lymph node abscess    hosp for infected lymph node (Salmonella) at 33 y/o   PCOS (polycystic ovarian syndrome)    Pneumonia    Post partum depression    Post term pregnancy, 41 weeks 12/18/2014   Postpartum care following vaginal delivery (7/1) 12/18/2014   Sleep apnea    CPAP @ [facility]   Past Surgical History:  Procedure Laterality Date   LAD bx     MASS EXCISION Right 02/22/2021   Procedure: EXCISION RIGHT NASAL POLYPOID MASS;  Surgeon: Ethyl Lonni FORBES, MD;  Location: [facility] OR;  Service: ENT;  Laterality: Right;   NASAL SEPTOPLASTY W/ TURBINOPLASTY N/A 02/22/2021   Procedure: NASAL SEPTOPLASTY WITH TURBINATE REDUCTION;  Surgeon: Ethyl Lonni FORBES, MD;  Location: [facility] OR;  Service: ENT;  Laterality: N/A;   WISDOM TOOTH EXTRACTION     Patient Active Problem List   Diagnosis Date Noted   OSA (obstructive sleep apnea) 08/02/2023   Hypersomnia 08/02/2023   Anxiety 07/13/2023   GERD (gastroesophageal reflux disease) 07/13/2023   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 07/13/2023   Vestibular migraine 08/02/2022   ADD (attention deficit disorder) without hyperactivity 08/02/2022   Abnormal sensory exam 11/29/2021   Vertigo of central origin 03/01/2021   OSA on CPAP 03/01/2021   Abnormal brain MRI 03/01/2021   Nasal septal deviation 02/22/2021   PCOS (polycystic ovarian syndrome) 12/15/2020    Narcolepsy with cataplexy 06/30/2020   Allergic rhinitis 07/06/2008   Asthma 07/06/2008    PCP: Cyndi Shaver, PA-C  REFERRING PROVIDER: Kandyce Sor, MD  REFERRING DIAG: N39.8 (ICD-10-CM) - Other specified disorders of urinary system   THERAPY DIAG:  Cramp and spasm  Rationale for Evaluation and Treatment: Rehabilitation  ONSET DATE: 9 months ago  SUBJECTIVE:  SUBJECTIVE STATEMENT: Patient comes to her appt 18 mins late.  Patient reports that she has been trying to relax and do breathing exercises and stretching exercises She has not been working, won't know how jumping will be Reports that she was not able to come last time due to car trouble.       From eval Patient reports that sometimes it takes a long time to pee could really have to go and comes out slowly, does everything she can to relax, does not feel like she completely empties and pushes out, has been going on for a long time, maybe it's inflammation. 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 Yes bladder NPRS scale: 2-5/10 PRECAUTIONS: None  RED FLAGS: None   WEIGHT BEARING RESTRICTIONS: No  FALLS:  Has patient fallen in last 6 months? No  OCCUPATION: dance teacher  ACTIVITY LEVEL : active  PLOF: Independent  PATIENT GOALS: would like to not feel like she will wet herself with jumping at work  PERTINENT HISTORY:  Hemorrhage with deliveries  Sexual abuse: No  BOWEL MOVEMENT: no issues- some GI issues,  avoids trigger foods  URINATION: Pain with urination: No sometimes it feels like there is pressure, feels better after she pees, bladder hurts sometimes Fully empty bladder: No Stream: Weak sometimes Urgency: Yes with jumping Frequency: varies Leakage: none Pads: No  INTERCOURSE:  Ability to have vaginal penetration Yes - on the left side Pain with intercourse: on the left side of abdomen DrynessNo Climax: yes Marinoff Scale: 1/3   PREGNANCY: Vaginal deliveries 2- 68 yo and 33 yo- 4 hour labor with pushing, daughter had triple cord loop, hemorrhaged after she had them Tearing Yes:   Episiotomy No C-section deliveries no Currently pregnant No  PROLAPSE: None   OBJECTIVE:  Note: Objective measures were completed at Evaluation unless otherwise noted.  PATIENT SURVEYS:    PFIQ-7: 42 COGNITION: Overall cognitive status: Within functional limits for tasks assessed     SENSATION: Light touch: Appears intact  Single leg squat- no issues   FUNCTIONAL TESTS:  Single leg squat- within functional limitations Abdominal crunch- WFL  POSTURE: rounded shoulders and forward head   LUMBARAROM/PROM: full   LOWER EXTREMITY MNF:qloo  LOWER EXTREMITY MMT: WFL PALPATION:   General: tenderness LLQ, upper chest breathing, decreased lower rib expansion  Pelvic Alignment: even  Abdominal: tenderness LLQ                External Perineal Exam: good clitoral hood mobility, labia majora and minora visible                             Internal Pelvic Floor: mild urethrocele, good strength present  Patient confirms identification and approves PT to assess internal pelvic floor and treatment Yes Patient confirms identification and approves PT to assess internal pelvic floor and treatment Yes No emotional/communication barriers or cognitive limitation. Patient is motivated to learn. Patient understands and agrees with treatment goals and plan. PT explains patient will be examined in  standing, sitting, and lying down to see how their muscles and joints  work. When they are ready, they will be asked to remove their underwear so PT can examine their perineum. The patient is also given the option of providing their own chaperone as one is not provided in our facility. The patient also has the right and is explained the right to defer or refuse any part of the evaluation or treatment including the internal exam. With the patient's consent, PT will use one gloved finger to gently assess the muscles of the pelvic floor, seeing how well it contracts and relaxes and if there is muscle symmetry. After, the patient will get dressed and PT and patient will discuss exam findings and plan of care. PT and patient discuss plan of care, schedule, attendance policy and HEP activities.     PELVIC MMT:   MMT eval  Vaginal 5/5 15 sec  Internal Anal Sphincter   External Anal Sphincter   Puborectalis   Diastasis Recti 1 finger, good tone, no distortion  (Blank rows = not tested)  Difficulty with reverse kegel        TONE: high  PROLAPSE: Maybe mild urethrocele Very mild bladder prolapse in supine or standing  TODAY'S TREATMENT:                                                                                                                              DATE:  02/11/2024  Review of HEP  and progress Diaphragmatic breathing reed with thera band around lower ribs 3 mins Pelvic floor drops Child's pose 2 mins with diaphragmatic breathing  Deep squat with diaphragmatic breathing  Happy baby with diaphragmatic breathing  on a wall 2 mins     01/08/2024   EVAL Examination completed, findings reviewed, pt educated on POC, HEP, and female pelvic floor anatomy, reasoning with pelvic floor assessment internally with pt consent. Pt motivated to participate in PT and agreeable to attempt recommendations.     PATIENT EDUCATION:  Education details: Pt was educated on relevant anatomy, exam  findings, HEP, expectations of PT, vulvovaginal massage with lubricant- good clean love samples given   Person educated: Patient Education method: Explanation, Demonstration, Tactile cues, Verbal cues, and Handouts Education comprehension: verbalized understanding, returned demonstration, verbal cues required, tactile cues required, and needs further education  HOME EXERCISE PROGRAM: [id number]  ASSESSMENT:  CLINICAL IMPRESSION:Patient 18 mins late to her appt today, coming back after a month.  Patient was seen today for treatment of SUI and high tone pelvic floor. Patient with improving symptoms. Patient did well with exercises and education today. We discussed progress, HEP and recommended consistency of PT appts as she can and no kegels . Treatment session focused on review of initial evaluation and HEP to help with lengthening of her pelvic floor, downtraining, diaphragmatic breathing. Patient had mild  difficulty with diaphragmatic breathing . Patient is progressing well  towards goals and will benefit from continued PT to address deficits, reduce SUI with high tone and improve quality of life.  From eval:  High tone, patient with issue of diarrhea, a lot of gripping Patient is a 33 y.o. F who was seen today for physical therapy evaluation and treatment for urinary urgency with dance and bladder pain with urinary hesitancy. Exam findings are notable for upper chest breathing strategies, abdominal tenderness on left lower quadrant, tight pelvic floor muscles with good strength but difficulty with bulging External soft tissues of pelvic floor are normal. Patient demonstrates trunk flexbility, bilateral hip flexibility, reduced range or motion in pelvic floor- with lengthening and pain . It is difficult for patient to jump at work due to feeling like she is going to leak . Discussed findings with patient, recommended vulvovaginal massage today,  and HEP was initiated. Patient's quality of life  has been affected, patient will benefit from physical therapy to address deficits, reduce urinary urgency, hesitancy and bladder pain and improve QOL .   OBJECTIVE IMPAIRMENTS: decreased ROM, impaired sensation, impaired tone, and pain.   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   REHAB POTENTIAL: Good  CLINICAL DECISION MAKING: Evolving/moderate complexity  EVALUATION COMPLEXITY: Moderate   GOALS: Goals reviewed with patient? Yes  SHORT TERM GOALS: Target date: 02/05/2024    Patient will be educated on  bladder irritants Baseline: Goal status: INITIAL  2. Patient will complete a  bladder diary Baseline:  Goal status: INITIAL  3.  Pt will be independent with HEP.   Baseline:  Goal status: met  4.  Pt will be independent with the knack, urge suppression technique, and double voiding in order to improve bladder habits and decrease urinary incontinence.   Baseline:  Goal status: INITIAL  LONG TERM GOALS: Target date: 07/14/2024  Patient will report 0 leaking or urinary urgency with jumping 15 jumps Baseline:  Goal status: INITIAL  2.  Patient will urinate within 10 seconds of sitting on the toilet Baseline:  Goal status: met  3.  Patient will demonstrate good lateral rib expansion and diaphragmatic breathing   Baseline:  Goal status: INITIAL  4.  Patient will be I with vulvovaginal massage to reduce pain Baseline:  Goal status: INITIAL  5.  Patient will dem good pelvic floor AROM/ PROM and be able to bulge her pelvic floor Baseline:  Goal status: INITIAL    PLAN:  PT FREQUENCY: 1-2x/week  PT DURATION: 6 months  PLANNED INTERVENTIONS: 97110-Therapeutic exercises, 97530- Therapeutic activity, 97112- Neuromuscular re-education, 97535- Self Care, 02859- Manual therapy, [phone]- Electrical  stimulation (manual), [phone] (1-2 muscles), [phone] (3+ muscles)- Dry Needling, Patient/Family education, Taping, Joint mobilization, Joint manipulation, Spinal manipulation, Spinal mobilization, Scar mobilization, Cryotherapy, Moist heat, and Biofeedback  PLAN FOR NEXT SESSION: urge suppression training, pelvic floor manual release, diaphragmatic breathing reed SYI exercises- start next   Cindi Ghazarian, PT 02/11/2024, 8:44 AM

## 2024-02-14 DIAGNOSIS — F439 Reaction to severe stress, unspecified: Secondary | ICD-10-CM | POA: Diagnosis not present

## 2024-02-19 ENCOUNTER — Other Ambulatory Visit (HOSPITAL_BASED_OUTPATIENT_CLINIC_OR_DEPARTMENT_OTHER): Payer: Self-pay

## 2024-02-21 DIAGNOSIS — F439 Reaction to severe stress, unspecified: Secondary | ICD-10-CM | POA: Diagnosis not present

## 2024-02-26 ENCOUNTER — Ambulatory Visit: Attending: Obstetrics | Admitting: Physical Therapy

## 2024-02-26 DIAGNOSIS — R252 Cramp and spasm: Secondary | ICD-10-CM | POA: Insufficient documentation

## 2024-02-26 NOTE — Therapy (Signed)
 OUTPATIENT PHYSICAL THERAPY FEMALE PELVIC TREATMENT   Patient Name: Brandi Hall MRN: [id number] DOB:March 23, 1991, 33 y.o., female Today's Date: 02/26/2024  END OF SESSION:  PT End of Session - 02/26/24 1011     Visit Number 3    Date for PT Re-Evaluation 07/14/24    Authorization Type [employer] COMM PPO    PT Start Time 0930    PT Stop Time 1015    PT Time Calculation (min) 45 min    Activity Tolerance Patient tolerated treatment well    Behavior During Therapy WFL for tasks assessed/performed           Past Medical History:  Diagnosis Date   Allergic rhinitis    Anxiety    Asthma    Bronchitis    Lymph node abscess    hosp for infected lymph node (Salmonella) at 33 y/o   PCOS (polycystic ovarian syndrome)    Pneumonia    Post partum depression    Post term pregnancy, 41 weeks 12/18/2014   Postpartum care following vaginal delivery (7/1) 12/18/2014   Sleep apnea    CPAP @ HS   Past Surgical History:  Procedure Laterality Date   LAD bx     MASS EXCISION Right 02/22/2021   Procedure: EXCISION RIGHT NASAL POLYPOID MASS;  Surgeon: Ethyl Lonni FORBES, MD;  Location: [facility] OR;  Service: ENT;  Laterality: Right;   NASAL SEPTOPLASTY W/ TURBINOPLASTY N/A 02/22/2021   Procedure: NASAL SEPTOPLASTY WITH TURBINATE REDUCTION;  Surgeon: Ethyl Lonni FORBES, MD;  Location: [facility] OR;  Service: ENT;  Laterality: N/A;   WISDOM TOOTH EXTRACTION     Patient Active Problem List   Diagnosis Date Noted   OSA (obstructive sleep apnea) 08/02/2023   Hypersomnia 08/02/2023   Anxiety 07/13/2023   GERD (gastroesophageal reflux disease) 07/13/2023   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 07/13/2023   Vestibular migraine 08/02/2022   ADD (attention deficit disorder) without hyperactivity 08/02/2022   Abnormal sensory exam 11/29/2021   Vertigo of central origin 03/01/2021   OSA on CPAP 03/01/2021   Abnormal brain MRI 03/01/2021   Nasal septal deviation 02/22/2021   PCOS (polycystic ovarian syndrome) 12/15/2020    Narcolepsy with cataplexy 06/30/2020   Allergic rhinitis 07/06/2008   Asthma 07/06/2008    PCP: Cyndi Shaver, PA-C  REFERRING PROVIDER: Kandyce Sor, MD  REFERRING DIAG: N39.8 (ICD-10-CM) - Other specified disorders of urinary system   THERAPY DIAG:  Cramp and spasm  Rationale for Evaluation and Treatment: Rehabilitation  ONSET DATE: 9 months ago  SUBJECTIVE:  SUBJECTIVE STATEMENT: Patient 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 She taught for 6 hours. She reports that her glutes have been feeling tight. No other pelvic pain to report.   From eval Patient reports that sometimes it takes a long time to pee could really have to go and comes out slowly, does everything she can to relax, does not feel like she completely empties and pushes out, has been going on for a long time, maybe it's inflammation. 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 Yes bladder NPRS scale: 2-5/10 PRECAUTIONS: None  RED FLAGS: None   WEIGHT BEARING RESTRICTIONS: No  FALLS:  Has patient fallen in last 6 months? No  OCCUPATION: dance teacher  ACTIVITY LEVEL : active  PLOF: Independent  PATIENT GOALS: would like to not feel like she will wet herself with jumping at work  PERTINENT HISTORY:  Hemorrhage with deliveries  Sexual abuse: No  BOWEL MOVEMENT: no issues- some GI issues, avoids trigger foods  URINATION: Pain with  urination: No sometimes it feels like there is pressure, feels better after she pees, bladder hurts sometimes Fully empty bladder: No Stream: Weak sometimes Urgency: Yes with jumping Frequency: varies Leakage: none Pads: No  INTERCOURSE:  Ability to have vaginal penetration Yes - on the left side Pain with intercourse: on the left side of abdomen DrynessNo Climax: yes Marinoff Scale: 1/3   PREGNANCY: Vaginal deliveries 2- 45 yo and 33 yo- 4 hour labor with pushing, daughter had triple cord loop, hemorrhaged after she had them Tearing Yes:   Episiotomy No C-section deliveries no Currently pregnant No  PROLAPSE: None   OBJECTIVE:  Note: Objective measures were completed at Evaluation unless otherwise noted.  PATIENT SURVEYS:    PFIQ-7: 42 COGNITION: Overall cognitive status: Within functional limits for tasks assessed     SENSATION: Light touch: Appears intact  Single leg squat- no issues   FUNCTIONAL TESTS:  Single leg squat- within functional limitations Abdominal crunch- WFL  POSTURE: rounded shoulders and forward head   LUMBARAROM/PROM: full   LOWER EXTREMITY MNF:qloo  LOWER EXTREMITY MMT: WFL PALPATION:   General: tenderness LLQ, upper chest breathing, decreased lower rib expansion  Pelvic Alignment: even  Abdominal: tenderness LLQ                External Perineal Exam: good clitoral hood mobility, labia majora and minora visible                             Internal Pelvic Floor: mild urethrocele, good strength present  Patient confirms identification and approves PT to assess internal pelvic floor and treatment Yes Patient confirms identification and approves PT to assess internal pelvic floor and treatment Yes No emotional/communication barriers or cognitive limitation. Patient is motivated to learn. Patient understands and agrees with treatment goals and plan. PT explains patient will be examined in standing, sitting, and lying down to see how  their muscles and joints work. When they are ready, they will be asked to remove their  underwear so PT can examine their perineum. The patient is also given the option of providing their own chaperone as one is not provided in our facility. The patient also has the right and is explained the right to defer or refuse any part of the evaluation or treatment including the internal exam. With the patient's consent, PT will use one gloved finger to gently assess the muscles of the pelvic floor, seeing how well it contracts and relaxes and if there is muscle symmetry. After, the patient will get dressed and PT and patient will discuss exam findings and plan of care. PT and patient discuss plan of care, schedule, attendance policy and HEP activities.     PELVIC MMT:   MMT eval  Vaginal 5/5 15 sec  Internal Anal Sphincter   External Anal Sphincter   Puborectalis   Diastasis Recti 1 finger, good tone, no distortion  (Blank rows = not tested)  Difficulty with reverse kegel        TONE: high  PROLAPSE: Maybe mild urethrocele Very mild bladder prolapse in supine or standing  TODAY'S TREATMENT:                                                                                                                              DATE:  02/26/24: Seated pelvic floor contraction + diaphragmatic breathing 2x10 Sidelying clamshell + reverse clamshell + diaphragmatic breathing 2x10  Seated clamshell (GTB) + diaphragmatic breathing 2x10  Squat + GTB around knees + chair touch + 5# kettlebell + diaphragmatic breathing 2x10  Supine piriformis stretch + diaphragmatic breathing 2x64min   02/11/2024 Review of HEP  and progress Diaphragmatic breathing reed with thera band around lower ribs 3 mins Pelvic floor drops Child's pose 2 mins with diaphragmatic breathing  Deep squat with diaphragmatic breathing  Happy baby with diaphragmatic breathing  on a wall 2 mins  01/08/2024   EVAL Examination completed, findings  reviewed, pt educated on POC, HEP, and female pelvic floor anatomy, reasoning with pelvic floor assessment internally with pt consent. Pt motivated to participate in PT and agreeable to attempt recommendations.    PATIENT EDUCATION:  Education details: Pt was educated on relevant anatomy, exam findings, HEP, expectations of PT, vulvovaginal massage with lubricant- good clean love samples given   Person educated: Patient Education method: Explanation, Demonstration, Tactile cues, Verbal cues, and Handouts Education comprehension: verbalized understanding, returned demonstration, verbal cues required, tactile cues required, and needs further education  HOME EXERCISE PROGRAM: Access Code: [email or web address] URL: [email or web address] Date: 02/26/2024 Prepared by: Celena Domino  Exercises - Seated Pelvic Floor Contraction  - 1 x daily - 7 x weekly - 2 sets - 10 reps - Clamshell  - 1 x daily - 7 x weekly - 2 sets - 12 reps - Sidelying Reverse Clamshell  - 1 x daily - 7 x weekly - 2 sets - 12 reps - Seated Hip Abduction with Resistance  - 1 x daily - 7  x weekly - 2 sets - 10 reps - Squat with Chair Touch and Resistance Loop  - 1 x daily - 7 x weekly - 2 sets - 10 reps - Supine Diaphragmatic Breathing  - 1 x daily - 7 x weekly - 2 sets - 10 reps - Supine Pelvic Floor Stretch - Hands on Knees  - 1 x daily - 7 x weekly - 2 sets - 10 reps - Supported Child's Pose with Diaphragmatic Breathing with Pelvic Floor Relaxation  - 1 x daily - 7 x weekly - 2 sets - 10 reps - Supported Teacher, music with Pelvic Floor Relaxation  - 1 x daily - 7 x weekly - 2 sets - 10 reps - Seated Pelvic Floor Lengthening  - 1 x daily - 7 x weekly - 2 sets - 10 reps - Deep Squat with Pelvic Floor Relaxation  - 1 x daily - 7 x weekly - 2 sets - 10 reps - Diaphragmatic Breathing at 90/90 Supported  - 1 x daily - 7 x weekly - 2 sets - 10 reps - Supine Figure 4 Piriformis Stretch  - 1 x daily - 7 x weekly - 2 sets -  hold  ASSESSMENT: CLINICAL IMPRESSION: Patient was seen today for treatment of SUI and high tone pelvic floor. Patient with improving symptoms and better urinary control overall. Patient did well with exercises and education today. Treatment session focused on review of HEP to help with lengthening of her pelvic floor, downtraining, diaphragmatic breathing. We introduced lateral pelvic strengthening and pt tolerated this very well. Patient is progressing well towards goals and will benefit from continued PT to address deficits, reduce SUI with high tone and improve quality of life.     From eval:  High tone, patient with issue of diarrhea, a lot of gripping Patient is a 33 y.o. F who was seen today for physical therapy evaluation and treatment for urinary urgency with dance and bladder pain with urinary hesitancy. Exam findings are notable for upper chest breathing strategies, abdominal tenderness on left lower quadrant, tight pelvic floor muscles with good strength but difficulty with bulging External soft tissues of pelvic floor are normal. Patient demonstrates trunk flexbility, bilateral hip flexibility, reduced range or motion in pelvic floor- with lengthening and pain . It is difficult for patient to jump at work due to feeling like she is going to leak . Discussed findings with patient, recommended vulvovaginal massage today,  and HEP was initiated. Patient's quality of life has been affected, patient will benefit from physical therapy to address deficits, reduce urinary urgency, hesitancy and bladder pain and improve QOL .   OBJECTIVE IMPAIRMENTS: decreased ROM, impaired sensation, impaired tone, and pain.   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   REHAB POTENTIAL: Good  CLINICAL DECISION MAKING: Evolving/moderate  complexity  EVALUATION COMPLEXITY: Moderate   GOALS: Goals reviewed with patient? Yes  SHORT TERM GOALS: Target date: 02/05/2024    Patient will be educated on  bladder irritants Baseline: Goal status: GOT MET   2. Patient will complete a  bladder diary Baseline:  Goal status: GOAL MET  3.  Pt will be independent with HEP.   Baseline:  Goal status: GOAL MET  4.  Pt will be independent with the knack, urge suppression technique, and double voiding in order to improve bladder habits and decrease urinary incontinence.  Baseline:  Goal status: GOAL MET  LONG TERM GOALS: Target date: 07/14/2024  Patient will report 0 leaking or urinary urgency with jumping 15 jumps Baseline:  Goal status: INITIAL  2.  Patient will urinate within 10 seconds of sitting on the toilet Baseline:  Goal status: met  3.  Patient will demonstrate good lateral rib expansion and diaphragmatic breathing   Baseline:  Goal status: INITIAL  4.  Patient will be I with vulvovaginal massage to reduce pain Baseline:  Goal status: INITIAL  5.  Patient will dem good pelvic floor AROM/ PROM and be able to bulge her pelvic floor Baseline:  Goal status: INITIAL    PLAN:  PT FREQUENCY: 1-2x/week  PT DURATION: 6 months  PLANNED INTERVENTIONS: 97110-Therapeutic exercises, 97530- Therapeutic activity, 97112- Neuromuscular re-education, 97535- Self Care, [id number]- Manual therapy, [phone]- Electrical stimulation (manual), [phone] (1-2 muscles), [phone] (3+ muscles)- Dry Needling, Patient/Family education, Taping, Joint mobilization, Joint manipulation, Spinal manipulation, Spinal mobilization, Scar mobilization, Cryotherapy, Moist heat, and Biofeedback  PLAN FOR NEXT SESSION: urge suppression training, pelvic floor manual release, diaphragmatic breathing reed SYI exercises- start next  Celena JAYSON Domino, PT 02/26/2024, 10:12 AM

## 2024-02-28 DIAGNOSIS — F439 Reaction to severe stress, unspecified: Secondary | ICD-10-CM | POA: Diagnosis not present

## 2024-03-05 ENCOUNTER — Ambulatory Visit: Admitting: Physical Therapy

## 2024-03-05 DIAGNOSIS — R252 Cramp and spasm: Secondary | ICD-10-CM

## 2024-03-05 NOTE — Therapy (Signed)
 OUTPATIENT PHYSICAL THERAPY FEMALE PELVIC TREATMENT   Patient Name: Brandi Hall MRN: [id number] DOB:12/04/1990, 33 y.o., female Today's Date: 03/05/2024  END OF SESSION:  PT End of Session - 03/05/24 1016     Visit Number 4    Date for PT Re-Evaluation 07/14/24    Authorization Type [employer] COMM PPO    PT Start Time 0930    PT Stop Time 1015    PT Time Calculation (min) 45 min    Activity Tolerance Patient tolerated treatment well    Behavior During Therapy WFL for tasks assessed/performed            Past Medical History:  Diagnosis Date   Allergic rhinitis    Anxiety    Asthma    Bronchitis    Lymph node abscess    hosp for infected lymph node (Salmonella) at 33 y/o   PCOS (polycystic ovarian syndrome)    Pneumonia    Post partum depression    Post term pregnancy, 41 weeks 12/18/2014   Postpartum care following vaginal delivery (7/1) 12/18/2014   Sleep apnea    CPAP @ [facility]   Past Surgical History:  Procedure Laterality Date   LAD bx     MASS EXCISION Right 02/22/2021   Procedure: EXCISION RIGHT NASAL POLYPOID MASS;  Surgeon: Ethyl Lonni FORBES, MD;  Location: [facility] OR;  Service: ENT;  Laterality: Right;   NASAL SEPTOPLASTY W/ TURBINOPLASTY N/A 02/22/2021   Procedure: NASAL SEPTOPLASTY WITH TURBINATE REDUCTION;  Surgeon: Ethyl Lonni FORBES, MD;  Location: [facility] OR;  Service: ENT;  Laterality: N/A;   WISDOM TOOTH EXTRACTION     Patient Active Problem List   Diagnosis Date Noted   OSA (obstructive sleep apnea) 08/02/2023   Hypersomnia 08/02/2023   Anxiety 07/13/2023   GERD (gastroesophageal reflux disease) 07/13/2023   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 07/13/2023   Vestibular migraine 08/02/2022   ADD (attention deficit disorder) without hyperactivity 08/02/2022   Abnormal sensory exam 11/29/2021   Vertigo of central origin 03/01/2021   OSA on CPAP 03/01/2021   Abnormal brain MRI 03/01/2021   Nasal septal deviation 02/22/2021   PCOS (polycystic ovarian syndrome) 12/15/2020    Narcolepsy with cataplexy 06/30/2020   Allergic rhinitis 07/06/2008   Asthma 07/06/2008    PCP: Cyndi Shaver, PA-C  REFERRING PROVIDER: Kandyce Sor, MD  REFERRING DIAG: N39.8 (ICD-10-CM) - Other specified disorders of urinary system   THERAPY DIAG:  Cramp and spasm  Rationale for Evaluation and Treatment: Rehabilitation  ONSET DATE: 9 months ago  SUBJECTIVE:  SUBJECTIVE STATEMENT: She reports that she has not had any pelvic floor leakage this week. She has been feeling stronger in the pelvis too. No pelvic pain to report. She will sometimes feel discomfort in the right lower quadrant. Felt good after last session, no questions.   From eval Patient reports that sometimes it takes a long time to pee could really have to go and comes out slowly, does everything she can to relax, does not feel like she completely empties and pushes out, has been going on for a long time, maybe it's inflammation. 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 Yes bladder NPRS scale: 2-5/10 PRECAUTIONS: None  RED FLAGS: None   WEIGHT BEARING RESTRICTIONS: No  FALLS:  Has patient fallen in last 6 months? No  OCCUPATION: dance teacher  ACTIVITY LEVEL : active  PLOF: Independent  PATIENT GOALS: would like to not feel like she will wet herself with jumping at work  PERTINENT HISTORY:  Hemorrhage with deliveries  Sexual abuse: No  BOWEL MOVEMENT: no issues- some GI issues, avoids trigger  foods  URINATION: Pain with urination: No sometimes it feels like there is pressure, feels better after she pees, bladder hurts sometimes Fully empty bladder: No Stream: Weak sometimes Urgency: Yes with jumping Frequency: varies Leakage: none Pads: No  INTERCOURSE:  Ability to have vaginal penetration Yes - on the left side Pain with intercourse: on the left side of abdomen DrynessNo Climax: yes Marinoff Scale: 1/3   PREGNANCY: Vaginal deliveries 2- 54 yo and 33 yo- 4 hour labor with pushing, daughter had triple cord loop, hemorrhaged after she had them Tearing Yes:   Episiotomy No C-section deliveries no Currently pregnant No  PROLAPSE: None   OBJECTIVE:  Note: Objective measures were completed at Evaluation unless otherwise noted.  PATIENT SURVEYS:    PFIQ-7: 42 COGNITION: Overall cognitive status: Within functional limits for tasks assessed     SENSATION: Light touch: Appears intact  Single leg squat- no issues   FUNCTIONAL TESTS:  Single leg squat- within functional limitations Abdominal crunch- WFL  POSTURE: rounded shoulders and forward head   LUMBARAROM/PROM: full   LOWER EXTREMITY MNF:qloo  LOWER EXTREMITY MMT: WFL PALPATION:   General: tenderness LLQ, upper chest breathing, decreased lower rib expansion  Pelvic Alignment: even  Abdominal: tenderness LLQ                External Perineal Exam: good clitoral hood mobility, labia majora and minora visible                             Internal Pelvic Floor: mild urethrocele, good strength present  Patient confirms identification and approves PT to assess internal pelvic floor and treatment Yes Patient confirms identification and approves PT to assess internal pelvic floor and treatment Yes No emotional/communication barriers or cognitive limitation. Patient is motivated to learn. Patient understands and agrees with treatment goals and plan. PT explains patient will be examined in standing,  sitting, and lying down to see how their muscles and joints work. When they are ready, they will be asked to  remove their underwear so PT can examine their perineum. The patient is also given the option of providing their own chaperone as one is not provided in our facility. The patient also has the right and is explained the right to defer or refuse any part of the evaluation or treatment including the internal exam. With the patient's consent, PT will use one gloved finger to gently assess the muscles of the pelvic floor, seeing how well it contracts and relaxes and if there is muscle symmetry. After, the patient will get dressed and PT and patient will discuss exam findings and plan of care. PT and patient discuss plan of care, schedule, attendance policy and HEP activities.     PELVIC MMT:   MMT eval  Vaginal 5/5 15 sec  Internal Anal Sphincter   External Anal Sphincter   Puborectalis   Diastasis Recti 1 finger, good tone, no distortion  (Blank rows = not tested)  Difficulty with reverse kegel        TONE: high  PROLAPSE: Maybe mild urethrocele Very mild bladder prolapse in supine or standing  TODAY'S TREATMENT:                                                                                                                              DATE:  03/05/24: Seated clamshell (GTB) + diaphragmatic breathing 2x20 Sit to stand holding 10# KB + GTB around knees + diaphragmatic breathing 2x10  Squat holding 10# KB + GTB around knees + diaphragmatic breathing 2x10  Standing half clam at wall + GTB around knees + diaphragmatic breathing 2x10  Standing pull down (GTB) + single leg march + diaphragmatic breathing 2x10 each   02/26/24: Seated pelvic floor contraction + diaphragmatic breathing 2x10 Sidelying clamshell + reverse clamshell + diaphragmatic breathing 2x10  Seated clamshell (GTB) + diaphragmatic breathing 2x10  Squat + GTB around knees + chair touch + 5# kettlebell + diaphragmatic  breathing 2x10  Supine piriformis stretch + diaphragmatic breathing 2x61min   02/11/2024 Review of HEP  and progress Diaphragmatic breathing reed with thera band around lower ribs 3 mins Pelvic floor drops Child's pose 2 mins with diaphragmatic breathing  Deep squat with diaphragmatic breathing  Happy baby with diaphragmatic breathing  on a wall 2 mins  PATIENT EDUCATION:  Education details: Pt was educated on relevant anatomy, exam findings, HEP, expectations of PT, vulvovaginal massage with lubricant- good clean love samples given   Person educated: Patient Education method: Explanation, Demonstration, Tactile cues, Verbal cues, and Handouts Education comprehension: verbalized understanding, returned demonstration, verbal cues required, tactile cues required, and needs further education  HOME EXERCISE PROGRAM: Access Code: [email or web address] URL: [email or web address] Date: 03/05/2024 Prepared by: Celena Domino  Exercises - Seated Pelvic Floor Contraction  - 1 x daily - 7 x weekly - 2 sets - 10 reps - Clamshell  - 1 x daily - 7 x weekly - 2 sets - 12 reps - Sidelying Reverse Clamshell  -  1 x daily - 7 x weekly - 2 sets - 12 reps - Seated Hip Abduction with Resistance  - 1 x daily - 7 x weekly - 2 sets - 10 reps - Squat with Resistance at Thighs  - 1 x daily - 7 x weekly - 2 sets - 10 reps - Standing Clam with Resistance Loop  - 1 x daily - 7 x weekly - 2 sets - 10 reps - Resistance Pulldown with March  - 1 x daily - 7 x weekly - 2 sets - 10 reps - Supine Diaphragmatic Breathing  - 1 x daily - 7 x weekly - 2 sets - 10 reps - Supine Pelvic Floor Stretch - Hands on Knees  - 1 x daily - 7 x weekly - 2 sets - 10 reps - Supported Child's Pose with Diaphragmatic Breathing with Pelvic Floor Relaxation  - 1 x daily - 7 x weekly - 2 sets - 10 reps - Supported Teacher, music with Pelvic Floor Relaxation  - 1 x daily - 7 x weekly - 2 sets - 10 reps - Seated Pelvic Floor Lengthening  - 1 x  daily - 7 x weekly - 2 sets - 10 reps - Deep Squat with Pelvic Floor Relaxation  - 1 x daily - 7 x weekly - 2 sets - 10 reps - Diaphragmatic Breathing at 90/90 Supported  - 1 x daily - 7 x weekly - 2 sets - 10 reps - Supine Figure 4 Piriformis Stretch  - 1 x daily - 7 x weekly - 2 sets - hold  ASSESSMENT: CLINICAL IMPRESSION: Patient was seen today for treatment of SUI and high tone pelvic floor. Patient with improving symptoms and better urinary control overall. Patient did well with exercises and education today. Treatment session focused on review of HEP to help with lengthening of her pelvic floor, downtraining, diaphragmatic breathing. We progressed her lateral pelvic strengthening and pt tolerated this very well. She feels a stronger connection to her core and hips with all of today's exercises. Patient is progressing well towards goals and will benefit from continued PT to address deficits, reduce SUI with high tone and improve quality of life.     From eval:  High tone, patient with issue of diarrhea, a lot of gripping Patient is a 33 y.o. F who was seen today for physical therapy evaluation and treatment for urinary urgency with dance and bladder pain with urinary hesitancy. Exam findings are notable for upper chest breathing strategies, abdominal tenderness on left lower quadrant, tight pelvic floor muscles with good strength but difficulty with bulging External soft tissues of pelvic floor are normal. Patient demonstrates trunk flexbility, bilateral hip flexibility, reduced range or motion in pelvic floor- with lengthening and pain . It is difficult for patient to jump at work due to feeling like she is going to leak . Discussed findings with patient, recommended vulvovaginal massage today,  and HEP was initiated. Patient's quality of life has been affected, patient will benefit from physical therapy to address deficits, reduce urinary urgency, hesitancy and bladder pain and improve  QOL .   OBJECTIVE IMPAIRMENTS: decreased ROM, impaired sensation, impaired tone, and pain.   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   REHAB POTENTIAL: Good  CLINICAL DECISION MAKING: Evolving/moderate complexity  EVALUATION COMPLEXITY: Moderate   GOALS: Goals reviewed with patient? Yes  SHORT  TERM GOALS: Target date: 02/05/2024    Patient will be educated on  bladder irritants Baseline: Goal status: GOAL MET   2. Patient will complete a  bladder diary Baseline:  Goal status: GOAL MET  3.  Pt will be independent with HEP.   Baseline:  Goal status: GOAL MET  4.  Pt will be independent with the knack, urge suppression technique, and double voiding in order to improve bladder habits and decrease urinary incontinence.   Baseline:  Goal status: GOAL MET  LONG TERM GOALS: Target date: 07/14/2024  Patient will report 0 leaking or urinary urgency with jumping 15 jumps Baseline:  Goal status: ONGOING  2.  Patient will urinate within 10 seconds of sitting on the toilet Baseline:  Goal status: met  3.  Patient will demonstrate good lateral rib expansion and diaphragmatic breathing   Baseline:  Goal status: ONGOING  4.  Patient will be I with vulvovaginal massage to reduce pain Baseline:  Goal status: ONGOING  5.  Patient will dem good pelvic floor AROM/ PROM and be able to bulge her pelvic floor Baseline:  Goal status: ONGOING   PLAN:  PT FREQUENCY: 1-2x/week  PT DURATION: 6 months  PLANNED INTERVENTIONS: 97110-Therapeutic exercises, 97530- Therapeutic activity, 97112- Neuromuscular re-education, 97535- Self Care, [id number]- Manual therapy, [phone]- Electrical stimulation (manual), [phone] (1-2 muscles), [phone] (3+ muscles)- Dry Needling, Patient/Family education, Taping, Joint mobilization, Joint  manipulation, Spinal manipulation, Spinal mobilization, Scar mobilization, Cryotherapy, Moist heat, and Biofeedback  PLAN FOR NEXT SESSION: urge suppression training, pelvic floor manual release, diaphragmatic breathing reed SYI exercises- start next  Celena JAYSON Domino, PT 03/05/2024, 10:16 AM

## 2024-03-06 DIAGNOSIS — F439 Reaction to severe stress, unspecified: Secondary | ICD-10-CM | POA: Diagnosis not present

## 2024-03-10 ENCOUNTER — Other Ambulatory Visit: Payer: Self-pay

## 2024-03-10 ENCOUNTER — Encounter: Payer: Self-pay | Admitting: Neurology

## 2024-03-10 ENCOUNTER — Other Ambulatory Visit (HOSPITAL_BASED_OUTPATIENT_CLINIC_OR_DEPARTMENT_OTHER): Payer: Self-pay

## 2024-03-10 ENCOUNTER — Ambulatory Visit (INDEPENDENT_AMBULATORY_CARE_PROVIDER_SITE_OTHER): Admitting: Neurology

## 2024-03-10 VITALS — BP 100/74 | HR 86 | Ht 65.0 in | Wt 133.2 lb

## 2024-03-10 DIAGNOSIS — F988 Other specified behavioral and emotional disorders with onset usually occurring in childhood and adolescence: Secondary | ICD-10-CM | POA: Diagnosis not present

## 2024-03-10 DIAGNOSIS — G43809 Other migraine, not intractable, without status migrainosus: Secondary | ICD-10-CM

## 2024-03-10 DIAGNOSIS — G4710 Hypersomnia, unspecified: Secondary | ICD-10-CM | POA: Diagnosis not present

## 2024-03-10 DIAGNOSIS — G4733 Obstructive sleep apnea (adult) (pediatric): Secondary | ICD-10-CM

## 2024-03-10 MED ORDER — SUNOSI 150 MG PO TABS
150.0000 mg | ORAL_TABLET | Freq: Every day | ORAL | 5 refills | Status: AC
  Filled 2024-03-10: qty 30, 30d supply, fill #0
  Filled 2024-04-11: qty 30, 30d supply, fill #1
  Filled 2024-05-13: qty 30, 30d supply, fill #2
  Filled 2024-06-13: qty 30, 30d supply, fill #3
  Filled 2024-07-14: qty 30, 30d supply, fill #4

## 2024-03-10 MED ORDER — AMPHETAMINE-DEXTROAMPHET ER 10 MG PO CP24
10.0000 mg | ORAL_CAPSULE | Freq: Every day | ORAL | 0 refills | Status: DC
  Filled 2024-03-10: qty 30, 30d supply, fill #0

## 2024-03-10 NOTE — Progress Notes (Signed)
 Provider:  Dedra Gores, MD  Primary Care Physician:  Cyndi Shaver, PA-C (Inactive) No address on file     Referring Provider: Cyndi Shaver, Pa-c No address on file          Chief Complaint according to patient   Patient presents with:          FSS 50/ 63, and ESS at 9/ 24 on sunosi  ,   before sunosi  14-16 / 24 /         HISTORY OF PRESENT ILLNESS:   Brandi Hall is a 33 y.o. female patient who is here for revisit 03/10/2024 for  Sunosi  refills.  She underwent a MSLT test and didn't have any REM sleep onset,  we are treating her for idiopathic hypersomnia.    Chief concern according to patient :  I ran out of Sunosi  and was terribly sleepy, I find this medication works well for me.   Anxiety is now treated by a therapist.  CPAP used ( I breeze device ) and using a 95% pressure of  6 cm water .   She often eoves the CPAP at night.  Daughter is not sleeping, ADHD, may be autism spectrum: She is an academically gifted child.      Review of Systems: Out of a complete 14 system review, the patient complains of only the following symptoms, and all other reviewed systems are negative.:   SLEEPINESS ?  How likely are you to doze in the following situations: 0 = not likely, 1 = slight chance, 2 = moderate chance, 3 = high chance  Sitting and Reading? Watching Television? Sitting inactive in a public place (theater or meeting)? Lying down in the afternoon when circumstances permit? Sitting and talking to someone? Sitting quietly after lunch without alcohol? In a car, while stopped for a few minutes in traffic? As a passenger in a car for an hour without a break?  Total = 9  FSS 50,        Social History   Socioeconomic History   Marital status: Married    Spouse name: marc   Number of children: 2   Years of education: Not on file   Highest education level: Bachelor's degree (e.g., BA, AB, BS)  Occupational History    Occupation: finished college, works-- Tourist information centre manager  Tobacco Use   Smoking status: Never   Smokeless tobacco: Never  Vaping Use   Vaping status: Never Used  Substance and Sexual Activity   Alcohol use: Yes    Alcohol/week: 1.0 standard drink of alcohol    Types: 1 Glasses of wine per week    Comment: on occasion   Drug use: No   Sexual activity: Yes  Other Topics Concern   Not on file  Social History Narrative   Live with husband 2 daughter - 2016 , 2020   Right handed   Caffeine: 3 cups of coffee a day and 1 tea or soda a day   Social Drivers of Corporate investment banker Strain: Low Risk  (09/19/2023)   Received from [facility]   Overall Financial Resource Strain (CARDIA)    Difficulty of Paying Living Expenses: Not very hard  Food Insecurity: No Food Insecurity (09/19/2023)   Received from [facility]   Hunger Vital Sign    Within the past 12 months, you worried that your food would run out before you got the money to buy more.: Never true  Within the past 12 months, the food you bought just didn't last and you didn't have money to get more.: Never true  Transportation Needs: No Transportation Needs (09/19/2023)   Received from [facility]   PRAPARE - Transportation    Lack of Transportation (Medical): No    Lack of Transportation (Non-Medical): No  Physical Activity: Sufficiently Active (09/22/2022)   Received from [facility]   Exercise Vital Sign    On average, how many days per week do you engage in moderate to strenuous exercise (like a brisk walk)?: 6 days    On average, how many minutes do you engage in exercise at this level?: 60 min  Stress: No Stress Concern Present (09/22/2022)   Received from [facility] - Occupational Stress Questionnaire    Feeling of Stress : Only a little  Social Connections: Moderately Integrated (09/22/2022)   Received from [facility]   Social Network    How would you rate your social  network (family, work, friends)?: Adequate participation with social networks    Family History  Problem Relation Age of Onset   Breast cancer Mother        dx age late 79s   Asthma Father    Uterine cancer Maternal Aunt    Colon cancer Other        Colon   Diabetes Other        GGF   Coronary artery disease Other    Congestive Heart Failure Other     Past Medical History:  Diagnosis Date   Allergic rhinitis    Anxiety    Asthma    Bronchitis    Lymph node abscess    hosp for infected lymph node (Salmonella) at 33 y/o   PCOS (polycystic ovarian syndrome)    Pneumonia    Post partum depression    Post term pregnancy, 41 weeks 12/18/2014   Postpartum care following vaginal delivery (7/1) 12/18/2014   Sleep apnea    CPAP @ [facility]    Past Surgical History:  Procedure Laterality Date   LAD bx     MASS EXCISION Right 02/22/2021   Procedure: EXCISION RIGHT NASAL POLYPOID MASS;  Surgeon: Ethyl Lonni BRAVO, MD;  Location: [facility] OR;  Service: ENT;  Laterality: Right;   NASAL SEPTOPLASTY W/ TURBINOPLASTY N/A 02/22/2021   Procedure: NASAL SEPTOPLASTY WITH TURBINATE REDUCTION;  Surgeon: Ethyl Lonni BRAVO, MD;  Location: [facility] OR;  Service: ENT;  Laterality: N/A;   WISDOM TOOTH EXTRACTION       Current Outpatient Medications on File Prior to Visit  Medication Sig Dispense Refill   acetaminophen  (TYLENOL ) 500 MG tablet Take 1,000 mg by mouth every 6 (six) hours as needed for mild pain.     albuterol  (VENTOLIN  HFA) 108 (90 Base) MCG/ACT inhaler Inhale 2 puffs into the lungs every 6 (six) hours as needed for wheezing or shortness of breath. 8 g 2   ALPRAZolam  (XANAX ) 0.25 MG tablet Take 1 tablet (0.25 mg total) by mouth at bedtime as needed for anxiety. 30 tablet 0   amphetamine -dextroamphetamine  (ADDERALL XR) 10 MG 24 hr capsule Take 1 capsule (10 mg total) by mouth daily. 30 capsule 0   dicyclomine  (BENTYL ) 10 MG capsule Take 1 capsule (10 mg total) by mouth 3 (three) times daily as  needed for spasms. 30 capsule 1   EPINEPHrine  0.3 mg/0.3 mL IJ SOAJ injection Inject 0.3 mg into the muscle as needed for anaphylaxis. 2 each 1  fluticasone -salmeterol (WIXELA INHUB ) 100-50 MCG/ACT AEPB Inhale 1 puff into the lungs 2 (two) times daily. 60 each 3   loratadine  (CLARITIN ) 10 MG tablet Take 10 mg by mouth daily as needed for allergies.     omeprazole  (PRILOSEC) 40 MG capsule Take 1 capsule (40 mg total) by mouth 2 (two) times daily before a meal. (Patient taking differently: Take 40 mg by mouth daily.) 120 capsule 0   OVER THE COUNTER MEDICATION Take 4 capsules by mouth daily. Myo-inositol     propranolol  (INDERAL ) 10 MG tablet Take 1 tablet (10 mg total) by mouth 30 to 60 minutes prior to anxiety-provoking situation. 30 tablet 0   semaglutide -weight management (WEGOVY ) 1 MG/0.5ML SOAJ SQ injection Inject 1 mg into the skin once a week. 2 mL 2   sertraline  (ZOLOFT ) 50 MG tablet Take 1 tablet (50 mg) by mouth every day by oral route. 90 tablet 3   Solriamfetol  HCl (SUNOSI ) 150 MG TABS TAKE 1 TABLET(150 MG) BY MOUTH IN THE MORNING 30 tablet 5   Tetrahydrozoline-Zn Sulfate (EYE DROPS ALLERGY RELIEF OP) Place 1 drop into both eyes daily as needed (allergy).     triamcinolone  (NASACORT ) 55 MCG/ACT AERO nasal inhaler Place 2 sprays into the nose daily. 2 sprays each nostril at night. 1 each 12   [DISCONTINUED] escitalopram  (LEXAPRO ) 10 MG tablet Take 1 tablet (10 mg total) by mouth daily. 30 tablet 2   Current Facility-Administered Medications on File Prior to Visit  Medication Dose Route Frequency Provider Last Rate Last Admin   gadobenate dimeglumine  (MULTIHANCE ) injection 15 mL  15 mL Intravenous Once PRN Dejay Kronk, Dedra, MD        Allergies  Allergen Reactions   Molds & Smuts Itching and Shortness Of Breath    Other Reaction(s): Not available   Cat Dander    Dog Epithelium (Canis Lupus Familiaris)     Other Reaction(s): Not available   Soy Allergy (Obsolete) Nausea And Vomiting    Milk Protein Diarrhea    Other Reaction(s): Abdominal Pain   Wound Dressing Adhesive Rash    Glue on Bandaids     DIAGNOSTIC DATA (LABS, IMAGING, TESTING) - I reviewed patient records, labs, notes, testing and imaging myself where available.  Lab Results  Component Value Date   WBC 5.7 07/13/2023   HGB 12.8 07/13/2023   HCT 38.2 07/13/2023   MCV 92.0 07/13/2023   PLT 253.0 07/13/2023      Component Value Date/Time   NA 139 07/13/2023 1034   NA 143 11/29/2021 1221   K 4.1 07/13/2023 1034   CL 108 07/13/2023 1034   CO2 25 07/13/2023 1034   GLUCOSE 80 07/13/2023 1034   BUN 11 07/13/2023 1034   BUN 7 11/29/2021 1221   CREATININE 0.58 07/13/2023 1034   CALCIUM 8.7 07/13/2023 1034   PROT 6.4 07/13/2023 1034   PROT 7.2 11/29/2021 1221   ALBUMIN 4.5 07/13/2023 1034   ALBUMIN 4.8 11/29/2021 1221   AST 16 07/13/2023 1034   ALT 17 07/13/2023 1034   ALKPHOS 55 07/13/2023 1034   BILITOT 0.5 07/13/2023 1034   BILITOT 0.5 11/29/2021 1221   GFRNONAA >60 09/06/2021 1051   Lab Results  Component Value Date   CHOL 153 07/13/2023   HDL 64.40 07/13/2023   LDLCALC 79 07/13/2023   TRIG 46.0 07/13/2023   CHOLHDL 2 07/13/2023   Lab Results  Component Value Date   HGBA1C 5.1 02/15/2021   Lab Results  Component Value Date   VITAMINB12  894 07/15/2015   Lab Results  Component Value Date   TSH 2.13 07/13/2023    PHYSICAL EXAM:  Vitals:   03/10/24 1335  BP: 100/74  Pulse: 86   No data found. Body mass index is 22.17 kg/m.   Wt Readings from Last 3 Encounters:  03/10/24 133 lb 3.2 oz (60.4 kg)  07/13/23 134 lb 9.6 oz (61.1 kg)  02/06/23 139 lb 3.2 oz (63.1 kg)     Ht Readings from Last 3 Encounters:  03/10/24 5' 5 (1.651 m)  07/13/23 5' 5 (1.651 m)  02/06/23 5' 6 (1.676 m)      General: The patient is awake, alert and appears not in acute distress. The patient is well groomed. Head: Normocephalic, atraumatic. Neck is supple.  Mallampati 2,  neck  circumference:13 inches . Nasal airflow congested.  Dental status: biological Cardiovascular:  Regular rate and cardiac rhythm by pulse,  without distended neck veins. Respiratory: Lungs are clear to auscultation.  Skin:  Without evidence of ankle edema, or rash. Trunk: The patient's posture is erect.   NEUROLOGIC EXAM: The patient is awake and alert, oriented to place and time.   Memory subjective described as intact.  Attention span & concentration ability appears normal.  Speech is fluent,  without  dysarthria, dysphonia or aphasia.  Mood and affect are appropriate.   Cranial nerves: no loss of smell or taste reported  Pupils are equal and briskly reactive to light. Funduscopic exam deferred.  Extraocular movements in vertical and horizontal planes were intact and without nystagmus. No Diplopia. Visual fields by finger perimetry are intact. Hearing was intact to soft voice and finger rubbing.    Facial sensation intact to fine touch.  Facial motor strength is symmetric and tongue and uvula move midline.  Neck ROM : rotation, tilt and flexion extension were normal for age and shoulder shrug was symmetrical.    Motor exam:  Symmetric bulk, tone and ROM.   Normal tone without cog- wheeling, symmetric grip strength.  Babinski's sign is absent bilaterally. Hoffman's sign is absent bilaterally. Gait and Station: Normal  ASSESSMENT AND PLAN :   33 y.o. year old female  here with:    1)  hypersomnia, anxiety and ADD/ ADHD.  Treated with Sunosis and Adderall.   2) refilled both medications today and obtained CET and CBC diff.   Virtual RV with NP in 6 months, followed by face to face visit in 12 months.      I would like to thank  [facility] , for allowing me to meet with this pleasant patient.   Sleep Clinic Patients are generally offered input on sleep hygiene, life style changes and how to improve compliance with medical treatment where  applicable. Review and reiteration of good sleep hygiene measures is offered to any sleep clinic patient, be it in the first consultation or with any follow up visits.    Any patient with sleepiness should be cautioned not to drive, work at heights, or operate dangerous or heavy equipment when feeling tired or sleepy.     The patient will be seen in follow-up in the sleep clinic at [facility] for discussion of test results, sleep related symptoms and treatment compliance review, further management strategies, etc.   The referring provider will be notified of the test results.   The patient's condition requires frequent monitoring and adjustments in the treatment plan, reflecting the ongoing complexity of care.  This provider is the  continuing focal point for all needed services for this condition.  After spending a total time of  23  minutes face to face and time for  history taking, physical and neurologic examination, review of laboratory studies,  personal review of imaging studies, reports and results of other testing and review of referral information / records as far as provided in visit,   Electronically signed by: Dedra Gores, MD 03/10/2024 1:58 PM  [facility] Board certified by The [employer] of Sleep Medicine and Diplomate of the [employer] of Sleep Medicine. Board certified In Neurology through the ABPN, Fellow of the [employer] of Neu[employer].

## 2024-03-10 NOTE — Patient Instructions (Signed)
 Rv in 6 months with NP VIRTUAL visit,  RV with MD in 12 months face to face     Hypersomnia Hypersomnia is a condition in which a person feels very tired during the day even though the person gets plenty of sleep at night. A person with this condition may take naps during the day and may find it very difficult to wake up from sleep. Hypersomnia may affect a person's ability to think, concentrate, drive, or remember things. What are the causes? The cause of this condition may not be known. Possible causes include: Taking certain medicines. Using drugs or alcohol. Sleep disorders, such as narcolepsy and sleep apnea. Injury to the head, brain, or spinal cord. Tumors. Certain medical conditions. These include: Depression. Diabetes. Gastroesophageal reflux disease (GERD). An underactive thyroid  gland (hypothyroidism). What are the signs or symptoms? The main symptoms of hypersomnia include: Feeling very tired throughout the day, regardless of how much sleep you got the night before. Having trouble waking up. Others may find it difficult to wake you up when you are sleeping. Sleeping for longer and longer periods at a time. Taking naps throughout the day. Other symptoms may include: Feeling restless, anxious, or annoyed. Lacking energy. Having trouble with: Remembering. Speaking. Thinking. Loss of appetite. Seeing, hearing, tasting, smelling, or feeling things that are not real (hallucinations). How is this diagnosed? This condition may be diagnosed based on: Your symptoms and medical history. Your sleeping habits. Your health care provider may ask you to write down your sleeping habits in a daily sleep log, along with any symptoms you have. A series of tests that are done while you sleep (sleep study or polysomnogram). A test that measures how quickly you can fall asleep during the day (daytime nap study or multiple sleep latency test). How is this treated? This condition may be  treated by: Following a regular sleep routine. Making lifestyle changes, such as changing your eating habits, getting regular exercise, and avoiding alcohol or caffeinated beverages. Taking medicines to make you more alert (stimulants) during the day. Treating any underlying medical causes of hypersomnia. Follow these instructions at home: Sleep habits Stick to a routine that includes going to bed and waking up at the same times every day and night. Practice a relaxing bedtime routine. This may include reading, meditation, deep breathing, or taking a warm bath before going to sleep. Exercise regularly as told by your health care provider. However, avoid exercising in the hours right before bedtime. Keep your sleep environment at a cooler temperature, darkened, and quiet. Sleep with pillows and a mattress that are comfortable and supportive. Schedule short 20-minute naps for when you feel sleepiest during the day. Talk with your employer or teachers about your hypersomnia. If possible, adjust your schedule so that: You have a regular daytime work schedule. You can take a scheduled nap during the day. You do not have to work or be active at night. Do not eat a heavy meal for a few hours before bedtime. Eat your meals at about the same times every day. Safety  Do not drive or use machinery if you are sleepy. Ask your health care provider if it is safe for you to drive. Wear a life jacket when swimming or spending time near water. General instructions  Take over-the-counter and prescription medicines only as told by your health care provider. This includes supplements. Avoid drinking alcohol or caffeinated beverages. Keep a sleep log that will help your health care provider manage your condition. This  may include information about: What time you go to bed each night. How often you wake up at night. How many hours you sleep at night. How often and for how long you nap during the day. Any  observations from others, such as leg movements during sleep, sleep walking, or snoring. Keep all follow-up visits. This is important. Contact a health care provider if: You have new symptoms. Your symptoms get worse. Get help right away if: You have thoughts about hurting yourself or someone else. Get help right away if you feel like you may hurt yourself or others, or have thoughts about taking your own life. Go to your nearest emergency room or: Call 911. Call the National Suicide Prevention Lifeline at 716-109-7337 or 988. This is open 24 hours a day. Text the Crisis Text Line at 701-261-6079. Summary Hypersomnia refers to a condition in which you feel very tired during the day even though you get plenty of sleep at night. A person with this condition may take naps during the day and may find it very difficult to wake up from sleep. Hypersomnia may affect a person's ability to think, concentrate, drive, or remember things. Treatment may include a regular sleep routine and making some lifestyle changes. This information is not intended to replace advice given to you by your health care provider. Make sure you discuss any questions you have with your health care provider. Document Revised: 05/16/2021 Document Reviewed: 05/16/2021 Elsevier Patient Education  2024 Elsevier Inc.   Solriamfetol  Tablets What is this medication? SOLRIAMFETOL  (sol ri AM fe tol) treats sleep disorders, such as narcolepsy and obstructive sleep apnea. It works by promoting wakefulness. This medicine may be used for other purposes; ask your health care provider or pharmacist if you have questions. COMMON BRAND NAME(S): SUNOSI  What should I tell my care team before I take this medication? They need to know if you have any of these conditions: Bipolar disorder Diabetes Heart disease High blood pressure High cholesterol History of stroke Kidney disease Schizophrenia Substance use disorder An unusual or allergic  reaction to solriamfetol , other medications, foods, dyes, or preservatives Pregnant or trying to get pregnant Breast-feeding How should I use this medication? Take this medication by mouth with water. Take it as directed on the prescription label at the same time every day. You can take it with or without food. If it upsets your stomach, take it with food. Keep taking it unless your care team tells you to stop. A special MedGuide will be given to you by the pharmacist with each prescription and refill. Be sure to read this information carefully each time. Talk to your care team about the use of this medication in children. Special care may be needed. Overdosage: If you think you have taken too much of this medicine contact a poison control center or emergency room at once. NOTE: This medicine is only for you. Do not share this medicine with others. What if I miss a dose? If you miss a dose, take it as soon as you can. However, avoid taking it within 9 hours of your planned bedtime, since you may find it harder to go to sleep. If it is almost time for your next dose, take only that dose. Do not take double or extra doses. What may interact with this medication? Do not take this medication with any of the following: MAOIs, such as Marplan, Nardil, and Parnate This medication may also interact with the following: Certain medications for Parkinson disease, such  as levodopa, pramipexole, ropinirole Medications that increase blood pressure or heart rate This list may not describe all possible interactions. Give your health care provider a list of all the medicines, herbs, non-prescription drugs, or dietary supplements you use. Also tell them if you smoke, drink alcohol, or use illegal drugs. Some items may interact with your medicine. What should I watch for while using this medication? Visit your care team for regular checks on your progress. Tell your care team if your symptoms do not start to get  better or if they get worse. This medication has a risk of abuse and dependence. Your care team will check you for this while you take this medication. Talk to your care team before breastfeeding. Changes to your treatment plan may be needed. What side effects may I notice from receiving this medication? Side effects that you should report to your care team as soon as possible: Allergic reactions--skin rash, itching, hives, swelling of the face, lips, tongue, or throat Fast or irregular heartbeat Heart attack--pain or tightness in the chest, shoulders, arms, or jaw, nausea, shortness of breath, cold or clammy skin, feeling faint or lightheaded Increase in blood pressure Mood and behavior changes--anxiety, nervousness, confusion, hallucinations, irritability, hostility, thoughts of suicide or self-harm, worsening mood, feelings of depression Stroke--sudden numbness or weakness of the face, arm, or leg, trouble speaking, confusion, trouble walking, loss of balance or coordination, dizziness, severe headache, change in vision Side effects that usually do not require medical attention (report these to your care team if they continue or are bothersome): Anxiety, nervousness Headache Loss of appetite Nausea Trouble sleeping This list may not describe all possible side effects. Call your doctor for medical advice about side effects. You may report side effects to FDA at 1-800-FDA-1088. Where should I keep my medication? Keep out of the reach of children and pets. This medication can be abused. Keep it in a safe place to protect it from theft. Do not share it with anyone. It is only for you. Selling or giving away this medication is dangerous and against the law. Store at room temperature between 20 and 25 degrees C (68 and 77 degrees F). This medication may cause harm or death if it is taken by other adults, children, or pets. It is important to get rid of the medication as soon as you no longer need  it or it is expired. You can do this in two ways: Take the medication to a medication take-back program. Check with your pharmacy or law enforcement to find a location. If you cannot return the medication, check the label or package insert to see if the medication should be thrown out in the garbage or flushed down the toilet. If you are not sure, ask your care team. If it is safe to put it in the trash, empty the medication out of the container. Mix the medication with cat litter, dirt, or other unwanted substance. Seal the mixture in a bag or container. Put it in the trash. NOTE: This sheet is a summary. It may not cover all possible information. If you have questions about this medicine, talk to your doctor, pharmacist, or health care provider.  2024 Elsevier/Gold Standard (2021-12-23 00:00:00)

## 2024-03-11 ENCOUNTER — Ambulatory Visit: Admitting: Physical Therapy

## 2024-03-11 ENCOUNTER — Encounter: Payer: Self-pay | Admitting: Physical Therapy

## 2024-03-11 DIAGNOSIS — R252 Cramp and spasm: Secondary | ICD-10-CM

## 2024-03-11 NOTE — Therapy (Signed)
 OUTPATIENT PHYSICAL THERAPY FEMALE PELVIC TREATMENT   Patient Name: Brandi Hall MRN: [id number] DOB:11-Apr-1991, 33 y.o., female Today's Date: 03/11/2024  END OF SESSION:  PT End of Session - 03/11/24 1016     Visit Number 5    Date for Recertification  07/14/24    Authorization Type [employer] COMM PPO    PT Start Time 1017    PT Stop Time 1100    PT Time Calculation (min) 43 min    Activity Tolerance Patient tolerated treatment well    Behavior During Therapy WFL for tasks assessed/performed            Past Medical History:  Diagnosis Date   Allergic rhinitis    Anxiety    Asthma    Bronchitis    Lymph node abscess    hosp for infected lymph node (Salmonella) at 33 y/o   PCOS (polycystic ovarian syndrome)    Pneumonia    Post partum depression    Post term pregnancy, 34 weeks 12/18/2014   Postpartum care following vaginal delivery (34/1) 12/18/2014   Sleep apnea    CPAP @ HS   Past Surgical History:  Procedure Laterality Date   LAD bx     MASS EXCISION Right 02/22/2021   Procedure: EXCISION RIGHT NASAL POLYPOID MASS;  Surgeon: Ethyl Lonni FORBES, MD;  Location: [facility] OR;  Service: ENT;  Laterality: Right;   NASAL SEPTOPLASTY W/ TURBINOPLASTY N/A 02/22/2021   Procedure: NASAL SEPTOPLASTY WITH TURBINATE REDUCTION;  Surgeon: Ethyl Lonni FORBES, MD;  Location: [facility] OR;  Service: ENT;  Laterality: N/A;   WISDOM TOOTH EXTRACTION     Patient Active Problem List   Diagnosis Date Noted   OSA (obstructive sleep apnea) 08/02/2023   Hypersomnia 08/02/2023   Anxiety 07/13/2023   GERD (gastroesophageal reflux disease) 07/13/2023   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 07/13/2023   Vestibular migraine 08/02/2022   ADD (attention deficit disorder) without hyperactivity 08/02/2022   Abnormal sensory exam 11/29/2021   Vertigo of central origin 03/01/2021   OSA on CPAP 03/01/2021   Abnormal brain MRI 03/01/2021   Nasal septal deviation 02/22/2021   PCOS (polycystic ovarian syndrome) 12/15/2020    Narcolepsy with cataplexy 06/30/2020   Allergic rhinitis 07/06/2008   Asthma 07/06/2008    PCP: Cyndi Shaver, PA-C  REFERRING PROVIDER: Kandyce Sor, MD  REFERRING DIAG: N39.8 (ICD-10-CM) - Other specified disorders of urinary system   THERAPY DIAG:  Cramp and spasm  Rationale for Evaluation and Treatment: Rehabilitation  ONSET DATE: 9 months ago  SUBJECTIVE:  SUBJECTIVE STATEMENT: Patient report that she feels good, feels like she can relax more, helpful. Leakage has been a lot better Sometimes discomfort in left lower quadrant due to chronic abdominal irritation to soy allergy    Last  She reports that she has not had any pelvic floor leakage this week. She has been feeling stronger in the pelvis too. No pelvic pain to report. She will sometimes feel discomfort in the right lower quadrant. Felt good after last session, no questions.   From eval Patient reports that sometimes it takes a long time to pee could really have to go and comes out slowly, does everything she can to relax, does not feel like she completely empties and pushes out, has been going on for a long time, maybe it's inflammation. 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 Yes bladder NPRS scale: 2-5/10 PRECAUTIONS: None  RED FLAGS: None   WEIGHT BEARING RESTRICTIONS: No  FALLS:  Has patient fallen in last 6 months? No  OCCUPATION: dance teacher  ACTIVITY LEVEL : active  PLOF: Independent  PATIENT GOALS:  would like to not feel like she will wet herself with jumping at work  PERTINENT HISTORY:  Hemorrhage with deliveries  Sexual abuse: No  BOWEL MOVEMENT: no issues- some GI issues, avoids trigger foods  URINATION: Pain with urination: No sometimes it feels like there is pressure, feels better after she pees, bladder hurts sometimes Fully empty bladder: No Stream: Weak sometimes Urgency: Yes with jumping Frequency: varies Leakage: none Pads: No  INTERCOURSE:  Ability to have vaginal penetration Yes - on the left side Pain with intercourse: on the left side of abdomen DrynessNo Climax: yes Marinoff Scale: 1/3   PREGNANCY: Vaginal deliveries 2- 41 yo and 33 yo- 4 hour labor with pushing, daughter had triple cord loop, hemorrhaged after she had them Tearing Yes:   Episiotomy No C-section deliveries no Currently pregnant No  PROLAPSE: None   OBJECTIVE:  Note: Objective measures were completed at Evaluation unless otherwise noted.  PATIENT SURVEYS:    PFIQ-7: 42 COGNITION: Overall cognitive status: Within functional limits for tasks assessed     SENSATION: Light touch: Appears intact  Single leg squat- no issues   FUNCTIONAL TESTS:  Single leg squat- within functional limitations Abdominal crunch- WFL  POSTURE: rounded shoulders and forward head   LUMBARAROM/PROM: full   LOWER EXTREMITY MNF:qloo  LOWER EXTREMITY MMT: WFL PALPATION:   General: tenderness LLQ, upper chest breathing, decreased lower rib expansion  Pelvic Alignment: even  Abdominal: tenderness LLQ                External Perineal Exam: good clitoral hood mobility, labia majora and minora visible                             Internal Pelvic Floor: mild urethrocele, good strength present  Patient confirms identification and approves PT to assess internal pelvic floor and treatment Yes Patient confirms identification and approves PT to assess internal pelvic floor and treatment Yes No  emotional/communication barriers or cognitive limitation. Patient is motivated to learn.  Patient understands and agrees with treatment goals and plan. PT explains patient will be examined in standing, sitting, and lying down to see how their muscles and joints work. When they are ready, they will be asked to remove their underwear so PT can examine their perineum. The patient is also given the option of providing their own chaperone as one is not provided in our facility. The patient also has the right and is explained the right to defer or refuse any part of the evaluation or treatment including the internal exam. With the patient's consent, PT will use one gloved finger to gently assess the muscles of the pelvic floor, seeing how well it contracts and relaxes and if there is muscle symmetry. After, the patient will get dressed and PT and patient will discuss exam findings and plan of care. PT and patient discuss plan of care, schedule, attendance policy and HEP activities.     PELVIC MMT:   MMT eval  Vaginal 5/5 15 sec  Internal Anal Sphincter   External Anal Sphincter   Puborectalis   Diastasis Recti 1 finger, good tone, no distortion  (Blank rows = not tested)  Difficulty with reverse kegel        TONE: high  PROLAPSE: Maybe mild urethrocele Very mild bladder prolapse in supine or standing  TODAY'S TREATMENT:                                                                                                                              DATE:  03/11/24 Diaphragmatic breathing with standing clam shells Squats with black loop around knees with diaphragmatic breathing 20 reps Side lying clam shell reverse and regular 20 reps bilat Squat holding 10# KB + black loop around knees + diaphragmatic breathing 2x10  Standing half clam at wall + black loop around knees + diaphragmatic breathing 2x10  Side stepping with black loop 4 short laps diaphragmatic breathing Supine diaphragmatic breathing  with #5 on abdomen-15 breaths Abdominal massage performed and patient educated on   03/05/24: Seated clamshell (GTB) + diaphragmatic breathing 2x20 Sit to stand holding 10# KB + GTB around knees + diaphragmatic breathing 2x10  Squat holding 10# KB + GTB around knees + diaphragmatic breathing 2x10  Standing half clam at wall + GTB around knees + diaphragmatic breathing 2x10  Standing pull down (GTB) + single leg march + diaphragmatic breathing 2x10 each   02/26/24: Seated pelvic floor contraction + diaphragmatic breathing 2x10 Sidelying clamshell + reverse clamshell + diaphragmatic breathing 2x10  Seated clamshell (GTB) + diaphragmatic breathing 2x10  Squat + GTB around knees + chair touch + 5# kettlebell + diaphragmatic breathing 2x10  Supine piriformis stretch + diaphragmatic breathing 2x30min   02/11/2024 Review of HEP  and progress Diaphragmatic breathing reed with thera band around lower ribs 3 mins Pelvic floor drops Child's pose 2 mins with diaphragmatic breathing  Deep squat with diaphragmatic breathing  Happy baby with diaphragmatic breathing  on  a wall 2 mins  PATIENT EDUCATION:  Education details: Pt was educated on relevant anatomy, exam findings, HEP, expectations of PT, vulvovaginal massage with lubricant- good clean love samples given   Person educated: Patient Education method: Explanation, Demonstration, Tactile cues, Verbal cues, and Handouts Education comprehension: verbalized understanding, returned demonstration, verbal cues required, tactile cues required, and needs further education  HOME EXERCISE PROGRAM: Access Code: [email or web address] URL: [email or web address] Date: 03/05/2024 Prepared by: Celena Domino  Exercises - Seated Pelvic Floor Contraction  - 1 x daily - 7 x weekly - 2 sets - 10 reps - Clamshell  - 1 x daily - 7 x weekly - 2 sets - 12 reps - Sidelying Reverse Clamshell  - 1 x daily - 7 x weekly - 2 sets - 12 reps - Seated Hip Abduction with  Resistance  - 1 x daily - 7 x weekly - 2 sets - 10 reps - Squat with Resistance at Thighs  - 1 x daily - 7 x weekly - 2 sets - 10 reps - Standing Clam with Resistance Loop  - 1 x daily - 7 x weekly - 2 sets - 10 reps - Resistance Pulldown with March  - 1 x daily - 7 x weekly - 2 sets - 10 reps - Supine Diaphragmatic Breathing  - 1 x daily - 7 x weekly - 2 sets - 10 reps - Supine Pelvic Floor Stretch - Hands on Knees  - 1 x daily - 7 x weekly - 2 sets - 10 reps - Supported Child's Pose with Diaphragmatic Breathing with Pelvic Floor Relaxation  - 1 x daily - 7 x weekly - 2 sets - 10 reps - Supported Teacher, music with Pelvic Floor Relaxation  - 1 x daily - 7 x weekly - 2 sets - 10 reps - Seated Pelvic Floor Lengthening  - 1 x daily - 7 x weekly - 2 sets - 10 reps - Deep Squat with Pelvic Floor Relaxation  - 1 x daily - 7 x weekly - 2 sets - 10 reps - Diaphragmatic Breathing at 90/90 Supported  - 1 x daily - 7 x weekly - 2 sets - 10 reps - Supine Figure 4 Piriformis Stretch  - 1 x daily - 7 x weekly - 2 sets - hold  ASSESSMENT: CLINICAL IMPRESSION: Patient was seen today for treatment of SUI and high tone pelvic floor. Patient with improving symptoms and better urinary control overall. Patient did well with exercises and education today. Educated her on abdominal massage for left lower quadrant tenderness. She is allergic to soy beans. Treatment session focused on review of HEP to help with lengthening of her pelvic floor, downtraining, diaphragmatic breathing. We progressed her lateral pelvic strengthening and pt tolerated this very well. She feels a stronger connection to her core and hips with all of today's exercises. Patient is progressing well towards goals and will benefit from continued PT to address deficits, reduce SUI with high tone and improve quality of life.     From eval:  High tone, patient with issue of diarrhea, a lot of gripping Patient is a 33 y.o. F who was seen today  for physical therapy evaluation and treatment for urinary urgency with dance and bladder pain with urinary hesitancy. Exam findings are notable for upper chest breathing strategies, abdominal tenderness on left lower quadrant, tight pelvic floor muscles with good strength but difficulty with bulging External soft tissues of pelvic floor are normal. Patient demonstrates trunk flexbility, bilateral  hip flexibility, reduced range or motion in pelvic floor- with lengthening and pain . It is difficult for patient to jump at work due to feeling like she is going to leak . Discussed findings with patient, recommended vulvovaginal massage today,  and HEP was initiated. Patient's quality of life has been affected, patient will benefit from physical therapy to address deficits, reduce urinary urgency, hesitancy and bladder pain and improve QOL .   OBJECTIVE IMPAIRMENTS: decreased ROM, impaired sensation, impaired tone, and pain.   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   REHAB POTENTIAL: Good  CLINICAL DECISION MAKING: Evolving/moderate complexity  EVALUATION COMPLEXITY: Moderate   GOALS: Goals reviewed with patient? Yes  SHORT TERM GOALS: Target date: 02/05/2024    Patient will be educated on  bladder irritants Baseline: Goal status: GOAL MET   2. Patient will complete a  bladder diary Baseline:  Goal status: GOAL MET  3.  Pt will be independent with HEP.   Baseline:  Goal status: GOAL MET  4.  Pt will be independent with the knack, urge suppression technique, and double voiding in order to improve bladder habits and decrease urinary incontinence.   Baseline:  Goal status: GOAL MET  LONG TERM GOALS: Target date: 07/14/2024  Patient will report 0 leaking or urinary urgency with jumping 15 jumps Baseline:  Goal  status: ONGOING  2.  Patient will urinate within 10 seconds of sitting on the toilet Baseline:  Goal status: met  3.  Patient will demonstrate good lateral rib expansion and diaphragmatic breathing   Baseline:  Goal status: ONGOING  4.  Patient will be I with vulvovaginal massage to reduce pain Baseline:  Goal status: ONGOING  5.  Patient will dem good pelvic floor AROM/ PROM and be able to bulge her pelvic floor Baseline:  Goal status: ONGOING   PLAN:  PT FREQUENCY: 1-2x/week  PT DURATION: 6 months  PLANNED INTERVENTIONS: 97110-Therapeutic exercises, 97530- Therapeutic activity, 97112- Neuromuscular re-education, 97535- Self Care, [id number]- Manual therapy, [phone]- Electrical stimulation (manual), [phone] (1-2 muscles), [id number] (3+ muscles)- Dry Needling, Patient/Family education, Taping, Joint mobilization, Joint manipulation, Spinal manipulation, Spinal mobilization, Scar mobilization, Cryotherapy, Moist heat, and Biofeedback  PLAN FOR NEXT SESSION: urge suppression training, pelvic floor manual release, diaphragmatic breathing reed SYI exercises- start next  Annarose Ouellet, PT 03/11/2024, 10:17 AM

## 2024-03-13 DIAGNOSIS — F439 Reaction to severe stress, unspecified: Secondary | ICD-10-CM | POA: Diagnosis not present

## 2024-03-18 ENCOUNTER — Ambulatory Visit: Admitting: Physical Therapy

## 2024-03-18 DIAGNOSIS — R252 Cramp and spasm: Secondary | ICD-10-CM

## 2024-03-18 NOTE — Therapy (Signed)
 OUTPATIENT PHYSICAL THERAPY FEMALE PELVIC DISCHARGE   Patient Name: Brandi Hall MRN: [id number] DOB:[birth year], 33 y.o., female Today's Date: 03/18/2024  END OF SESSION:  PT End of Session - 03/18/24 1002     Visit Number 6    Date for Recertification  07/14/24    Authorization Type BCBS COMM PPO    PT Start Time 0935    PT Stop Time 1015    PT Time Calculation (min) 40 min    Activity Tolerance Patient tolerated treatment well    Behavior During Therapy WFL for tasks assessed/performed             Past Medical History:  Diagnosis Date   Allergic rhinitis    Anxiety    Asthma    Bronchitis    Lymph node abscess    hosp for infected lymph node (Salmonella) at 33 y/o   PCOS (polycystic ovarian syndrome)    Pneumonia    Post partum depression    Post term pregnancy, 41 weeks 12/18/2014   Postpartum care following vaginal delivery (7/1) 12/18/2014   Sleep apnea    CPAP @ HS   Past Surgical History:  Procedure Laterality Date   LAD bx     MASS EXCISION Right 02/22/2021   Procedure: EXCISION RIGHT NASAL POLYPOID MASS;  Surgeon: Ethyl Lonni FORBES, MD;  Location: [facility] OR;  Service: ENT;  Laterality: Right;   NASAL SEPTOPLASTY W/ TURBINOPLASTY N/A 02/22/2021   Procedure: NASAL SEPTOPLASTY WITH TURBINATE REDUCTION;  Surgeon: Ethyl Lonni FORBES, MD;  Location: [facility] OR;  Service: ENT;  Laterality: N/A;   WISDOM TOOTH EXTRACTION     Patient Active Problem List   Diagnosis Date Noted   OSA (obstructive sleep apnea) 08/02/2023   Hypersomnia 08/02/2023   Anxiety 07/13/2023   GERD (gastroesophageal reflux disease) 07/13/2023   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 07/13/2023   Vestibular migraine 08/02/2022   ADD (attention deficit disorder) without hyperactivity 08/02/2022   Abnormal sensory exam 11/29/2021   Vertigo of central origin 03/01/2021   OSA on CPAP 03/01/2021   Abnormal brain MRI 03/01/2021   Nasal septal deviation 02/22/2021   PCOS (polycystic ovarian syndrome) 12/15/2020    Narcolepsy with cataplexy 06/30/2020   Allergic rhinitis 07/06/2008   Asthma 07/06/2008    PCP: Cyndi Shaver, PA-C  REFERRING PROVIDER: Kandyce Sor, MD  REFERRING DIAG: N39.8 (ICD-10-CM) - Other specified disorders of urinary system  THERAPY DIAG:  Cramp and spasm  Rationale for Evaluation and Treatment: Rehabilitation  ONSET DATE: 9 months ago  SUBJECTIVE:  SUBJECTIVE STATEMENT: Patient reports that her pelvic floor has been good. No leakage to report at all. She thinks her lower abdominal pain is related to soy allergy and not muscle. She feels good about her current exercise routine. No leakage with jumping at dance recently.   From eval Patient reports that sometimes it takes a long time to pee could really have to go and comes out slowly, does everything she can to relax, does not feel like she completely empties and pushes out, has been going on for a long time, maybe it's inflammation. 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 Yes bladder NPRS scale: 2-5/10  0/10 pain at discharge   PRECAUTIONS: None  RED FLAGS: None   WEIGHT BEARING RESTRICTIONS: No  FALLS:  Has patient fallen in last 6 months? No  OCCUPATION: dance teacher  ACTIVITY LEVEL : active  PLOF: Independent  PATIENT GOALS: would like to not feel like she will wet herself with jumping at work  PERTINENT HISTORY:  Hemorrhage with deliveries  Sexual abuse: No  BOWEL MOVEMENT: no issues- some GI issues,  avoids trigger foods  URINATION: Pain with urination: No sometimes it feels like there is pressure, feels better after she pees, bladder hurts sometimes Fully empty bladder: No Stream: Weak sometimes - strong now  Urgency: Yes with jumping Frequency: varies Leakage: none Pads: No  INTERCOURSE:  Ability to have vaginal penetration Yes - on the left side Pain with intercourse: on the left side of abdomen DrynessNo Climax: yes Marinoff Scale: 1/3  PREGNANCY: Vaginal deliveries 2- 51 yo and 33 yo- 4 hour labor with pushing, daughter had triple cord loop, hemorrhaged after she had them Tearing Yes:   Episiotomy No C-section deliveries no Currently pregnant No  PROLAPSE: None  OBJECTIVE:  Note: Objective measures were completed at Evaluation unless otherwise noted.  PATIENT SURVEYS:   PFIQ-7: 42 COGNITION: Overall cognitive status: Within functional limits for tasks assessed     SENSATION: Light touch: Appears intact  Single leg squat- no issues  FUNCTIONAL TESTS:  Single leg squat- within functional limitations Abdominal crunch- WFL  POSTURE: rounded shoulders and forward head  LUMBARAROM/PROM: full  LOWER EXTREMITY MNF:qloo  LOWER EXTREMITY MMT: WFL PALPATION:   General: tenderness LLQ, upper chest breathing, decreased lower rib expansion  Pelvic Alignment: even  Abdominal: tenderness LLQ                External Perineal Exam: good clitoral hood mobility, labia majora and minora visible                             Internal Pelvic Floor: mild urethrocele, good strength present  Patient confirms identification and approves PT to assess internal pelvic floor and treatment Yes Patient confirms identification and approves PT to assess internal pelvic floor and treatment Yes No emotional/communication barriers or cognitive limitation. Patient is motivated to learn. Patient understands and agrees with treatment goals and plan. PT explains patient will be examined  in standing, sitting, and lying down to see how their muscles and joints work. When they are ready,  they will be asked to remove their underwear so PT can examine their perineum. The patient is also given the option of providing their own chaperone as one is not provided in our facility. The patient also has the right and is explained the right to defer or refuse any part of the evaluation or treatment including the internal exam. With the patient's consent, PT will use one gloved finger to gently assess the muscles of the pelvic floor, seeing how well it contracts and relaxes and if there is muscle symmetry. After, the patient will get dressed and PT and patient will discuss exam findings and plan of care. PT and patient discuss plan of care, schedule, attendance policy and HEP activities.     PELVIC MMT:   MMT eval  Vaginal 5/5 15 sec  Internal Anal Sphincter   External Anal Sphincter   Puborectalis   Diastasis Recti 1 finger, good tone, no distortion  (Blank rows = not tested) Difficulty with reverse kegel        TONE: high  PROLAPSE: Maybe mild urethrocele Very mild bladder prolapse in supine or standing  TODAY'S TREATMENT:                                                                                                                              DATE:  03/11/24 Diaphragmatic breathing with standing clam shells Squats with black loop around knees with diaphragmatic breathing 20 reps Side lying clam shell reverse and regular 20 reps bilat Squat holding 10# KB + black loop around knees + diaphragmatic breathing 2x10  Standing half clam at wall + black loop around knees + diaphragmatic breathing 2x10  Side stepping with black loop 4 short laps diaphragmatic breathing Supine diaphragmatic breathing with #5 on abdomen-15 breaths Abdominal massage performed and patient educated on   03/05/24: Seated clamshell (GTB) + diaphragmatic breathing 2x20 Sit to stand holding 10# KB + GTB around  knees + diaphragmatic breathing 2x10  Squat holding 10# KB + GTB around knees + diaphragmatic breathing 2x10  Standing half clam at wall + GTB around knees + diaphragmatic breathing 2x10  Standing pull down (GTB) + single leg march + diaphragmatic breathing 2x10 each   02/26/24: Seated pelvic floor contraction + diaphragmatic breathing 2x10 Sidelying clamshell + reverse clamshell + diaphragmatic breathing 2x10  Seated clamshell (GTB) + diaphragmatic breathing 2x10  Squat + GTB around knees + chair touch + 5# kettlebell + diaphragmatic breathing 2x10  Supine piriformis stretch + diaphragmatic breathing 2x56min   02/11/2024 Review of HEP  and progress Diaphragmatic breathing reed with thera band around lower ribs 3 mins Pelvic floor drops Child's pose 2 mins with diaphragmatic breathing  Deep squat with diaphragmatic breathing  Happy baby with diaphragmatic breathing  on a wall 2 mins  03/18/24: Discharge day  Discussion of how to continue implementing pelvic PT into weekly life moving forward HEP review   PATIENT EDUCATION:  Education details: Pt was educated  on relevant anatomy, exam findings, HEP, expectations of PT, vulvovaginal massage with lubricant- good clean love samples given   Person educated: Patient Education method: Explanation, Demonstration, Tactile cues, Verbal cues, and Handouts Education comprehension: verbalized understanding, returned demonstration, verbal cues required, tactile cues required, and needs further education  HOME EXERCISE PROGRAM: Access Code: [email or web address] URL: [email or web address] Date: 03/05/2024 Prepared by: Celena Domino  Exercises - Seated Pelvic Floor Contraction  - 1 x daily - 7 x weekly - 2 sets - 10 reps - Clamshell  - 1 x daily - 7 x weekly - 2 sets - 12 reps - Sidelying Reverse Clamshell  - 1 x daily - 7 x weekly - 2 sets - 12 reps - Seated Hip Abduction with Resistance  - 1 x daily - 7 x weekly - 2 sets - 10 reps - Squat with  Resistance at Thighs  - 1 x daily - 7 x weekly - 2 sets - 10 reps - Standing Clam with Resistance Loop  - 1 x daily - 7 x weekly - 2 sets - 10 reps - Resistance Pulldown with March  - 1 x daily - 7 x weekly - 2 sets - 10 reps - Supine Diaphragmatic Breathing  - 1 x daily - 7 x weekly - 2 sets - 10 reps - Supine Pelvic Floor Stretch - Hands on Knees  - 1 x daily - 7 x weekly - 2 sets - 10 reps - Supported Child's Pose with Diaphragmatic Breathing with Pelvic Floor Relaxation  - 1 x daily - 7 x weekly - 2 sets - 10 reps - Supported Teacher, music with Pelvic Floor Relaxation  - 1 x daily - 7 x weekly - 2 sets - 10 reps - Seated Pelvic Floor Lengthening  - 1 x daily - 7 x weekly - 2 sets - 10 reps - Deep Squat with Pelvic Floor Relaxation  - 1 x daily - 7 x weekly - 2 sets - 10 reps - Diaphragmatic Breathing at 90/90 Supported  - 1 x daily - 7 x weekly - 2 sets - 10 reps - Supine Figure 4 Piriformis Stretch  - 1 x daily - 7 x weekly - 2 sets - hold  ASSESSMENT: CLINICAL IMPRESSION: Patient was seen today for treatment of SUI and high tone pelvic floor. She reports improving pain during intercourse and no urinary leakage to report. She has met all goals in her 6 sessions of PFPT and is appropriate for discharge at this time.     From eval:  High tone, patient with issue of diarrhea, a lot of gripping Patient is a 33 y.o. F who was seen today for physical therapy evaluation and treatment for urinary urgency with dance and bladder pain with urinary hesitancy. Exam findings are notable for upper chest breathing strategies, abdominal tenderness on left lower quadrant, tight pelvic floor muscles with good strength but difficulty with bulging External soft tissues of pelvic floor are normal. Patient demonstrates trunk flexbility, bilateral hip flexibility, reduced range or motion in pelvic floor- with lengthening and pain . It is difficult for patient to jump at work due to feeling like she is going  to leak . Discussed findings with patient, recommended vulvovaginal massage today,  and HEP was initiated. Patient's quality of life has been affected, patient will benefit from physical therapy to address deficits, reduce urinary urgency, hesitancy and bladder pain and improve QOL .   OBJECTIVE IMPAIRMENTS: decreased ROM, impaired sensation, impaired tone, and pain.  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   REHAB POTENTIAL: Good  CLINICAL DECISION MAKING: Evolving/moderate complexity  EVALUATION COMPLEXITY: Moderate   GOALS: Goals reviewed with patient? Yes  SHORT TERM GOALS: Target date: 02/05/2024    Patient will be educated on  bladder irritants Baseline: Goal status: GOAL MET   2. Patient will complete a  bladder diary Baseline:  Goal status: GOAL MET  3.  Pt will be independent with HEP.   Baseline:  Goal status: GOAL MET  4.  Pt will be independent with the knack, urge suppression technique, and double voiding in order to improve bladder habits and decrease urinary incontinence.   Baseline:  Goal status: GOAL MET  LONG TERM GOALS: Target date: 07/14/2024  Patient will report 0 leaking or urinary urgency with jumping 15 jumps Baseline: none - now can do 32 jumps with no leakage   Goal status: GOAL MET  2.  Patient will urinate within 10 seconds of sitting on the toilet Baseline:  Goal status: GOAL MET  3.  Patient will demonstrate good lateral rib expansion and diaphragmatic breathing   Baseline:  Goal status: GOAL MET  4.  Patient will be I with vulvovaginal massage to reduce pain Baseline:  Goal status: GOAL MET  5.  Patient will dem good pelvic floor AROM/ PROM and be able to bulge her pelvic floor Baseline:  Goal status: GOAL MET   PHYSICAL THERAPY DISCHARGE SUMMARY  Visits  from Start of Care: 6  Current functional level related to goals / functional outcomes: See above   Remaining deficits: See above   Education / Equipment: See above   Patient agrees to discharge. Patient goals were met. Patient is being discharged due to meeting the stated rehab goals.   Celena JAYSON Domino, PT 03/18/2024, 10:07 AM

## 2024-03-27 DIAGNOSIS — F439 Reaction to severe stress, unspecified: Secondary | ICD-10-CM | POA: Diagnosis not present

## 2024-03-28 ENCOUNTER — Other Ambulatory Visit (HOSPITAL_BASED_OUTPATIENT_CLINIC_OR_DEPARTMENT_OTHER): Payer: Self-pay

## 2024-03-28 ENCOUNTER — Other Ambulatory Visit: Payer: Self-pay

## 2024-04-02 ENCOUNTER — Other Ambulatory Visit (HOSPITAL_BASED_OUTPATIENT_CLINIC_OR_DEPARTMENT_OTHER): Payer: Self-pay

## 2024-04-02 MED ORDER — PROPRANOLOL HCL 10 MG PO TABS
10.0000 mg | ORAL_TABLET | ORAL | 0 refills | Status: AC
  Filled 2024-04-02: qty 30, 30d supply, fill #0

## 2024-04-11 ENCOUNTER — Other Ambulatory Visit: Payer: Self-pay

## 2024-04-11 ENCOUNTER — Other Ambulatory Visit: Payer: Self-pay | Admitting: Neurology

## 2024-04-11 ENCOUNTER — Other Ambulatory Visit (HOSPITAL_BASED_OUTPATIENT_CLINIC_OR_DEPARTMENT_OTHER): Payer: Self-pay

## 2024-04-11 MED ORDER — AMPHETAMINE-DEXTROAMPHET ER 10 MG PO CP24
10.0000 mg | ORAL_CAPSULE | Freq: Every day | ORAL | 0 refills | Status: DC
  Filled 2024-04-11: qty 30, 30d supply, fill #0
  Filled 2024-05-13: qty 30, 30d supply, fill #1
  Filled 2024-06-13: qty 30, 30d supply, fill #2

## 2024-04-11 NOTE — Telephone Encounter (Signed)
 LOV 03/10/24  Next OV 09/02/24  Last refill 03/10/24, #30, 0 refills  Please review, thanks!

## 2024-04-17 DIAGNOSIS — F439 Reaction to severe stress, unspecified: Secondary | ICD-10-CM | POA: Diagnosis not present

## 2024-04-22 ENCOUNTER — Other Ambulatory Visit (HOSPITAL_BASED_OUTPATIENT_CLINIC_OR_DEPARTMENT_OTHER): Payer: Self-pay

## 2024-05-01 DIAGNOSIS — F439 Reaction to severe stress, unspecified: Secondary | ICD-10-CM | POA: Diagnosis not present

## 2024-05-02 DIAGNOSIS — Z9189 Other specified personal risk factors, not elsewhere classified: Secondary | ICD-10-CM | POA: Diagnosis not present

## 2024-05-02 DIAGNOSIS — R92333 Mammographic heterogeneous density, bilateral breasts: Secondary | ICD-10-CM | POA: Diagnosis not present

## 2024-05-02 DIAGNOSIS — Z1231 Encounter for screening mammogram for malignant neoplasm of breast: Secondary | ICD-10-CM | POA: Diagnosis not present

## 2024-05-02 DIAGNOSIS — Z803 Family history of malignant neoplasm of breast: Secondary | ICD-10-CM | POA: Diagnosis not present

## 2024-05-08 DIAGNOSIS — F439 Reaction to severe stress, unspecified: Secondary | ICD-10-CM | POA: Diagnosis not present

## 2024-05-16 ENCOUNTER — Other Ambulatory Visit (HOSPITAL_BASED_OUTPATIENT_CLINIC_OR_DEPARTMENT_OTHER): Payer: Self-pay

## 2024-05-22 ENCOUNTER — Other Ambulatory Visit (HOSPITAL_COMMUNITY): Payer: Self-pay

## 2024-05-22 DIAGNOSIS — F439 Reaction to severe stress, unspecified: Secondary | ICD-10-CM | POA: Diagnosis not present

## 2024-05-27 ENCOUNTER — Other Ambulatory Visit: Payer: Self-pay

## 2024-05-27 ENCOUNTER — Other Ambulatory Visit (HOSPITAL_BASED_OUTPATIENT_CLINIC_OR_DEPARTMENT_OTHER): Payer: Self-pay

## 2024-05-29 ENCOUNTER — Other Ambulatory Visit: Payer: Self-pay

## 2024-05-29 ENCOUNTER — Other Ambulatory Visit (HOSPITAL_BASED_OUTPATIENT_CLINIC_OR_DEPARTMENT_OTHER): Payer: Self-pay

## 2024-05-29 DIAGNOSIS — F439 Reaction to severe stress, unspecified: Secondary | ICD-10-CM | POA: Diagnosis not present

## 2024-05-29 DIAGNOSIS — M5481 Occipital neuralgia: Secondary | ICD-10-CM | POA: Diagnosis not present

## 2024-05-29 DIAGNOSIS — M47812 Spondylosis without myelopathy or radiculopathy, cervical region: Secondary | ICD-10-CM | POA: Diagnosis not present

## 2024-05-29 MED ORDER — PREDNISONE 10 MG PO TABS
ORAL_TABLET | ORAL | 0 refills | Status: AC
  Filled 2024-05-29 (×2): qty 21, 6d supply, fill #0

## 2024-06-05 DIAGNOSIS — F439 Reaction to severe stress, unspecified: Secondary | ICD-10-CM | POA: Diagnosis not present

## 2024-06-13 ENCOUNTER — Other Ambulatory Visit: Payer: Self-pay

## 2024-06-16 ENCOUNTER — Other Ambulatory Visit (HOSPITAL_BASED_OUTPATIENT_CLINIC_OR_DEPARTMENT_OTHER): Payer: Self-pay

## 2024-06-16 ENCOUNTER — Ambulatory Visit
Admission: RE | Admit: 2024-06-16 | Discharge: 2024-06-16 | Disposition: A | Discharge status: Home / Self Care | Source: Ambulatory Visit | Attending: Physician Assistant | Admitting: Physician Assistant

## 2024-06-16 VITALS — BP 101/70 | HR 83 | Temp 98.0°F | Resp 16

## 2024-06-16 DIAGNOSIS — N3000 Acute cystitis without hematuria: Secondary | ICD-10-CM

## 2024-06-16 LAB — POCT URINE DIPSTICK
Bilirubin, UA: NEGATIVE
Glucose, UA: NEGATIVE mg/dL
Ketones, POC UA: NEGATIVE mg/dL
Nitrite, UA: NEGATIVE
POC PROTEIN,UA: NEGATIVE
Spec Grav, UA: 1.025
Urobilinogen, UA: 0.2 U/dL
pH, UA: 7

## 2024-06-16 LAB — POCT URINE PREGNANCY: Preg Test, Ur: NEGATIVE

## 2024-06-16 MED ORDER — CEPHALEXIN 500 MG PO CAPS
500.0000 mg | ORAL_CAPSULE | Freq: Four times a day (QID) | ORAL | 0 refills | Status: AC
  Filled 2024-06-16: qty 28, 7d supply, fill #0

## 2024-06-16 NOTE — ED Triage Notes (Signed)
 Pt presetns to UC for c/o urinary frequency and dysuria since last night Took antibacterial protection azo w/o relief.

## 2024-06-16 NOTE — ED Provider Notes (Signed)
 " [facility]    CSN: [id number] Arrival date & time: 06/16/24  1401      History   Chief Complaint Chief Complaint  Patient presents with   Urinary Frequency    Urinary Tract infection - Entered by patient    HPI Brandi Hall is a 33 y.o. female.   Patient complains of burning with urination.  Patient feels like she may have a urinary tract infection.  Patient reports she has had similar in the past.  Patient denies any nausea or vomiting she is not having any abdominal pain patient denies any fever  The history is provided by the patient. No language interpreter was used.  Urinary Frequency    Past Medical History:  Diagnosis Date   Allergic rhinitis    Anxiety    Asthma    Bronchitis    Lymph node abscess    hosp for infected lymph node (Salmonella) at 33 y/o   PCOS (polycystic ovarian syndrome)    Pneumonia    Post partum depression    Post term pregnancy, 41 weeks 12/18/2014   Postpartum care following vaginal delivery (7/1) 12/18/2014   Sleep apnea    CPAP @ HS    Patient Active Problem List   Diagnosis Date Noted   OSA (obstructive sleep apnea) 08/02/2023   Hypersomnia 08/02/2023   Anxiety 07/13/2023   GERD (gastroesophageal reflux disease) 07/13/2023   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 07/13/2023   Vestibular migraine 08/02/2022   ADD (attention deficit disorder) without hyperactivity 08/02/2022   Abnormal sensory exam 11/29/2021   Vertigo of central origin 03/01/2021   OSA on CPAP 03/01/2021   Abnormal brain MRI 03/01/2021   Nasal septal deviation 02/22/2021   PCOS (polycystic ovarian syndrome) 12/15/2020   Narcolepsy with cataplexy 06/30/2020   Allergic rhinitis 07/06/2008   Asthma 07/06/2008    Past Surgical History:  Procedure Laterality Date   LAD bx     MASS EXCISION Right 02/22/2021   Procedure: EXCISION RIGHT NASAL POLYPOID MASS;  Surgeon: Ethyl Lonni FORBES, MD;  Location: [facility] OR;  Service: ENT;  Laterality: Right;   NASAL SEPTOPLASTY  W/ TURBINOPLASTY N/A 02/22/2021   Procedure: NASAL SEPTOPLASTY WITH TURBINATE REDUCTION;  Surgeon: Ethyl Lonni FORBES, MD;  Location: [facility] OR;  Service: ENT;  Laterality: N/A;   WISDOM TOOTH EXTRACTION      OB History     Gravida  2   Para  1   Term  1   Preterm      AB      Living  1      SAB      IAB      Ectopic      Multiple  0   Live Births  1            Home Medications    Prior to Admission medications  Medication Sig Start Date End Date Taking? Authorizing Provider  amphetamine -dextroamphetamine  (ADDERALL XR) 10 MG 24 hr capsule Take 1 capsule (10 mg total) by mouth daily. 04/11/24  Yes Dohmeier, Dedra, MD  cephALEXin  (KEFLEX ) 500 MG capsule Take 1 capsule (500 mg total) by mouth 4 (four) times daily. 06/16/24  Yes Levia Waltermire K, PA-C  loratadine  (CLARITIN ) 10 MG tablet Take 10 mg by mouth daily as needed for allergies.   Yes [provider]  propranolol  (INDERAL ) 10 MG tablet Take 1 tablet (10 mg total) by mouth 30 to 60 minutes prior to anxiety-provoking situation. 10/30/23  Yes  propranolol  (INDERAL ) 10 MG tablet Take 1 tablet (10 mg total) by mouth 30-60 minutes prior to anxiety-provoking situation. 04/01/24  Yes Kandyce Sor, MD  sertraline  (ZOLOFT ) 50 MG tablet Take 1 tablet (50 mg) by mouth every day by oral route. 01/07/24  Yes   Solriamfetol  HCl (SUNOSI ) 150 MG TABS Take 1 tablet (150 mg total) by mouth daily at 6 (six) AM. 03/10/24  Yes Dohmeier, Dedra, MD  acetaminophen  (TYLENOL ) 500 MG tablet Take 1,000 mg by mouth every 6 (six) hours as needed for mild pain.    [provider]  albuterol  (VENTOLIN  HFA) 108 (90 Base) MCG/ACT inhaler Inhale 2 puffs into the lungs every 6 (six) hours as needed for wheezing or shortness of breath. 07/13/23   Drubel, Manuelita, PA-C  ALPRAZolam  (XANAX ) 0.25 MG tablet Take 1 tablet (0.25 mg total) by mouth at bedtime as needed for anxiety. 03/01/23   Dohmeier, Dedra, MD  dicyclomine  (BENTYL ) 10 MG  capsule Take 1 capsule (10 mg total) by mouth 3 (three) times daily as needed for spasms. 05/25/21   Federico Rosario BROCKS, MD  EPINEPHrine  0.3 mg/0.3 mL IJ SOAJ injection Inject 0.3 mg into the muscle as needed for anaphylaxis. 07/13/23   Cyndi Manuelita, PA-C  fluticasone -salmeterol (WIXELA INHUB ) 100-50 MCG/ACT AEPB Inhale 1 puff into the lungs 2 (two) times daily. 07/13/23   Cyndi Manuelita, PA-C  omeprazole  (PRILOSEC) 40 MG capsule Take 1 capsule (40 mg total) by mouth 2 (two) times daily before a meal. Patient taking differently: Take 40 mg by mouth daily. 07/08/21   Federico Rosario BROCKS, MD  OVER THE COUNTER MEDICATION Take 4 capsules by mouth daily. Myo-inositol    [provider]  semaglutide -weight management (WEGOVY ) 1 MG/0.5ML SOAJ SQ injection Inject 1 mg into the skin once a week. 01/16/24     Tetrahydrozoline-Zn Sulfate (EYE DROPS ALLERGY RELIEF OP) Place 1 drop into both eyes daily as needed (allergy).    [provider]  triamcinolone  (NASACORT ) 55 MCG/ACT AERO nasal inhaler Place 2 sprays into the nose daily. 2 sprays each nostril at night. 08/02/22   Dohmeier, Dedra, MD  escitalopram  (LEXAPRO ) 10 MG tablet Take 1 tablet (10 mg total) by mouth daily. 06/27/23 09/25/23      Family History Family History  Problem Relation Age of Onset   Breast cancer Mother        dx age late 37s   Asthma Father    Uterine cancer Maternal Aunt    Colon cancer Other        Colon   Diabetes Other        GGF   Coronary artery disease Other    Congestive Heart Failure Other     Social History Social History[1]   Allergies   Molds & smuts, Cat dander, Dog epithelium (canis lupus familiaris), Soy allergy (obsolete), Milk protein, and Wound dressing adhesive   Review of Systems Review of Systems  Genitourinary:  Positive for frequency.  All other systems reviewed and are negative.    Physical Exam Triage Vital Signs ED Triage Vitals  Encounter Vitals Group     BP 06/16/24 1413  101/70     Girls Systolic BP Percentile --      Girls Diastolic BP Percentile --      Boys Systolic BP Percentile --      Boys Diastolic BP Percentile --      Pulse Rate 06/16/24 1413 83     Resp 06/16/24 1413 16  Temp 06/16/24 1413 98 F (36.7 C)     Temp Source 06/16/24 1413 Oral     SpO2 06/16/24 1413 98 %     Weight --      Height --      Head Circumference --      Peak Flow --      Pain Score 06/16/24 1407 0     Pain Loc --      Pain Education --      Exclude from Growth Chart --    No data found.  Updated Vital Signs BP 101/70 (BP Location: Right Arm)   Pulse 83   Temp 98 F (36.7 C) (Oral)   Resp 16   LMP 06/09/2024 (Approximate)   SpO2 98%   Visual Acuity Right Eye Distance:   Left Eye Distance:   Bilateral Distance:    Right Eye Near:   Left Eye Near:    Bilateral Near:     Physical Exam Vitals and nursing note reviewed.  Constitutional:      Appearance: She is well-developed.  HENT:     Head: Normocephalic.  Cardiovascular:     Rate and Rhythm: Normal rate.  Pulmonary:     Effort: Pulmonary effort is normal.  Abdominal:     General: There is no distension.  Musculoskeletal:        General: Normal range of motion.     Cervical back: Normal range of motion.  Skin:    General: Skin is warm.  Neurological:     General: No focal deficit present.     Mental Status: She is alert and oriented to person, place, and time.      UC Treatments / Results  Labs (all labs ordered are listed, but only abnormal results are displayed) Labs Reviewed  POCT URINE DIPSTICK - Abnormal; Notable for the following components:      Result Value   Clarity, UA cloudy (*)    Blood, UA trace-intact (*)    Leukocytes, UA Small (1+) (*)    All other components within normal limits  POCT URINE PREGNANCY    EKG   Radiology No results found.  Procedures Procedures (including critical care time)  Medications Ordered in UC Medications - No data to  display  Initial Impression / Assessment and Plan / UC Course  I have reviewed the triage vital signs and the nursing notes.  Pertinent labs & imaging results that were available during my care of the patient were reviewed by me and considered in my medical decision making (see chart for details).     UA shows blood and leukocytes.  Patient is counseled on UTI she is given a prescription for Keflex  patient is discharged in stable condition. Final Clinical Impressions(s) / UC Diagnoses   Final diagnoses:  Acute cystitis without hematuria     Discharge Instructions      Return if any problems    ED Prescriptions     Medication Sig Dispense Auth. Provider   cephALEXin  (KEFLEX ) 500 MG capsule Take 1 capsule (500 mg total) by mouth 4 (four) times daily. 28 capsule Kinzee Happel K, PA-C      PDMP not reviewed this encounter. An After Visit Summary was printed and given to the patient.        [1]  Social History Tobacco Use   Smoking status: Never   Smokeless tobacco: Never  Vaping Use   Vaping status: Never Used  Substance Use Topics   Alcohol use:  Yes    Alcohol/week: 1.0 standard drink of alcohol    Types: 1 Glasses of wine per week    Comment: on occasion   Drug use: No     Flint Sonny POUR, PA-C 06/16/24 1838  "

## 2024-06-16 NOTE — Discharge Instructions (Signed)
 Return if any problems.

## 2024-06-27 ENCOUNTER — Other Ambulatory Visit (HOSPITAL_BASED_OUTPATIENT_CLINIC_OR_DEPARTMENT_OTHER): Payer: Self-pay

## 2024-06-27 ENCOUNTER — Other Ambulatory Visit: Payer: Self-pay

## 2024-07-14 ENCOUNTER — Other Ambulatory Visit (HOSPITAL_BASED_OUTPATIENT_CLINIC_OR_DEPARTMENT_OTHER): Payer: Self-pay

## 2024-07-14 ENCOUNTER — Other Ambulatory Visit: Payer: Self-pay

## 2024-07-14 ENCOUNTER — Encounter (HOSPITAL_BASED_OUTPATIENT_CLINIC_OR_DEPARTMENT_OTHER): Payer: Self-pay

## 2024-07-14 ENCOUNTER — Other Ambulatory Visit: Payer: Self-pay | Admitting: Neurology

## 2024-07-18 ENCOUNTER — Other Ambulatory Visit (HOSPITAL_BASED_OUTPATIENT_CLINIC_OR_DEPARTMENT_OTHER): Payer: Self-pay

## 2024-07-18 MED ORDER — AMPHETAMINE-DEXTROAMPHET ER 10 MG PO CP24
10.0000 mg | ORAL_CAPSULE | Freq: Every day | ORAL | 0 refills | Status: AC
  Filled 2024-07-18: qty 30, 30d supply, fill #0

## 2024-07-18 NOTE — Telephone Encounter (Signed)
 Pt last seen 03/10/24, last filled 06/16/24 30 tabs, upcoming appt scheduled 09/02/24. Refill is appropriate.

## 2024-07-21 ENCOUNTER — Ambulatory Visit: Admitting: Neurology

## 2024-09-02 ENCOUNTER — Telehealth: Admitting: Family Medicine
